# Patient Record
Sex: Female | Born: 1965
Health system: Southern US, Community
[De-identification: ages and names within clinical notes are randomized; demographics above are authoritative.]

## PROBLEM LIST (undated history)

## (undated) DIAGNOSIS — T4145XA Adverse effect of unspecified anesthetic, initial encounter: Secondary | ICD-10-CM

## (undated) DIAGNOSIS — N6019 Diffuse cystic mastopathy of unspecified breast: Secondary | ICD-10-CM

## (undated) DIAGNOSIS — T8859XA Other complications of anesthesia, initial encounter: Secondary | ICD-10-CM

## (undated) DIAGNOSIS — J45909 Unspecified asthma, uncomplicated: Secondary | ICD-10-CM

## (undated) DIAGNOSIS — T7840XA Allergy, unspecified, initial encounter: Secondary | ICD-10-CM

## (undated) DIAGNOSIS — R112 Nausea with vomiting, unspecified: Secondary | ICD-10-CM

## (undated) DIAGNOSIS — E039 Hypothyroidism, unspecified: Secondary | ICD-10-CM

## (undated) DIAGNOSIS — I10 Essential (primary) hypertension: Secondary | ICD-10-CM

## (undated) DIAGNOSIS — E079 Disorder of thyroid, unspecified: Secondary | ICD-10-CM

## (undated) DIAGNOSIS — Z9889 Other specified postprocedural states: Secondary | ICD-10-CM

## (undated) DIAGNOSIS — D649 Anemia, unspecified: Secondary | ICD-10-CM

## (undated) DIAGNOSIS — R2231 Localized swelling, mass and lump, right upper limb: Secondary | ICD-10-CM

## (undated) HISTORY — DX: Allergy, unspecified, initial encounter: T78.40XA

## (undated) HISTORY — DX: Unspecified asthma, uncomplicated: J45.909

## (undated) HISTORY — PX: TUBAL LIGATION: SHX77

## (undated) HISTORY — PX: BREAST CYST EXCISION: SHX579

## (undated) HISTORY — PX: BREAST SURGERY: SHX581

## (undated) HISTORY — PX: WISDOM TOOTH EXTRACTION: SHX21

## (undated) HISTORY — DX: Anemia, unspecified: D64.9

## (undated) HISTORY — DX: Diffuse cystic mastopathy of unspecified breast: N60.19

## (undated) HISTORY — PX: BREAST EXCISIONAL BIOPSY: SUR124

## (undated) HISTORY — DX: Disorder of thyroid, unspecified: E07.9

---
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
## 1997-07-18 ENCOUNTER — Other Ambulatory Visit: Admission: RE | Admit: 1997-07-18 | Discharge: 1997-07-18 | Payer: Self-pay | Admitting: Obstetrics and Gynecology

## 1998-07-24 ENCOUNTER — Other Ambulatory Visit: Admission: RE | Admit: 1998-07-24 | Discharge: 1998-07-24 | Payer: Self-pay | Admitting: *Deleted

## 1999-03-09 ENCOUNTER — Inpatient Hospital Stay (HOSPITAL_COMMUNITY): Admission: AD | Admit: 1999-03-09 | Discharge: 1999-03-09 | Payer: Self-pay | Admitting: Obstetrics and Gynecology

## 1999-05-03 ENCOUNTER — Inpatient Hospital Stay (HOSPITAL_COMMUNITY): Admission: AD | Admit: 1999-05-03 | Discharge: 1999-05-03 | Payer: Self-pay

## 1999-05-16 ENCOUNTER — Encounter (INDEPENDENT_AMBULATORY_CARE_PROVIDER_SITE_OTHER): Payer: Self-pay | Admitting: Specialist

## 1999-05-16 ENCOUNTER — Inpatient Hospital Stay (HOSPITAL_COMMUNITY): Admission: AD | Admit: 1999-05-16 | Discharge: 1999-05-18 | Payer: Self-pay | Admitting: Obstetrics and Gynecology

## 1999-05-19 ENCOUNTER — Encounter: Admission: RE | Admit: 1999-05-19 | Discharge: 1999-06-17 | Payer: Self-pay | Admitting: *Deleted

## 1999-07-01 ENCOUNTER — Other Ambulatory Visit: Admission: RE | Admit: 1999-07-01 | Discharge: 1999-07-01 | Payer: Self-pay | Admitting: *Deleted

## 1999-10-30 ENCOUNTER — Other Ambulatory Visit: Admission: RE | Admit: 1999-10-30 | Discharge: 1999-10-30 | Payer: Self-pay | Admitting: Obstetrics and Gynecology

## 2000-07-08 ENCOUNTER — Other Ambulatory Visit: Admission: RE | Admit: 2000-07-08 | Discharge: 2000-07-08 | Payer: Self-pay | Admitting: Obstetrics and Gynecology

## 2000-09-13 ENCOUNTER — Encounter: Payer: Self-pay | Admitting: Internal Medicine

## 2000-09-13 ENCOUNTER — Encounter: Admission: RE | Admit: 2000-09-13 | Discharge: 2000-09-13 | Payer: Self-pay | Admitting: Internal Medicine

## 2001-03-17 ENCOUNTER — Encounter: Admission: RE | Admit: 2001-03-17 | Discharge: 2001-03-17 | Payer: Self-pay | Admitting: Internal Medicine

## 2001-03-17 ENCOUNTER — Encounter: Payer: Self-pay | Admitting: Internal Medicine

## 2001-07-22 ENCOUNTER — Other Ambulatory Visit: Admission: RE | Admit: 2001-07-22 | Discharge: 2001-07-22 | Payer: Self-pay | Admitting: Obstetrics and Gynecology

## 2001-07-26 ENCOUNTER — Encounter: Admission: RE | Admit: 2001-07-26 | Discharge: 2001-07-26 | Payer: Self-pay | Admitting: Obstetrics and Gynecology

## 2001-07-26 ENCOUNTER — Encounter: Payer: Self-pay | Admitting: Obstetrics and Gynecology

## 2002-01-09 ENCOUNTER — Encounter: Admission: RE | Admit: 2002-01-09 | Discharge: 2002-01-09 | Payer: Self-pay | Admitting: Obstetrics and Gynecology

## 2002-01-09 ENCOUNTER — Encounter: Payer: Self-pay | Admitting: Obstetrics and Gynecology

## 2002-07-07 ENCOUNTER — Encounter: Payer: Self-pay | Admitting: Obstetrics and Gynecology

## 2002-07-07 ENCOUNTER — Encounter: Admission: RE | Admit: 2002-07-07 | Discharge: 2002-07-07 | Payer: Self-pay | Admitting: Obstetrics and Gynecology

## 2002-08-03 ENCOUNTER — Other Ambulatory Visit: Admission: RE | Admit: 2002-08-03 | Discharge: 2002-08-03 | Payer: Self-pay | Admitting: Obstetrics and Gynecology

## 2002-11-06 ENCOUNTER — Encounter: Admission: RE | Admit: 2002-11-06 | Discharge: 2002-11-06 | Payer: Self-pay | Admitting: Internal Medicine

## 2002-11-06 ENCOUNTER — Encounter: Payer: Self-pay | Admitting: Internal Medicine

## 2003-08-24 ENCOUNTER — Other Ambulatory Visit: Admission: RE | Admit: 2003-08-24 | Discharge: 2003-08-24 | Payer: Self-pay | Admitting: Obstetrics and Gynecology

## 2003-09-06 ENCOUNTER — Emergency Department (HOSPITAL_COMMUNITY): Admission: EM | Admit: 2003-09-06 | Discharge: 2003-09-06 | Payer: Self-pay | Admitting: Family Medicine

## 2003-09-11 ENCOUNTER — Encounter: Admission: RE | Admit: 2003-09-11 | Discharge: 2003-09-11 | Payer: Self-pay | Admitting: Obstetrics and Gynecology

## 2003-10-26 ENCOUNTER — Ambulatory Visit (HOSPITAL_COMMUNITY): Admission: RE | Admit: 2003-10-26 | Discharge: 2003-10-26 | Payer: Self-pay | Admitting: Internal Medicine

## 2004-08-25 ENCOUNTER — Other Ambulatory Visit: Admission: RE | Admit: 2004-08-25 | Discharge: 2004-08-25 | Payer: Self-pay | Admitting: Obstetrics and Gynecology

## 2004-09-01 ENCOUNTER — Ambulatory Visit (HOSPITAL_COMMUNITY): Admission: RE | Admit: 2004-09-01 | Discharge: 2004-09-01 | Payer: Self-pay | Admitting: Internal Medicine

## 2004-09-12 ENCOUNTER — Ambulatory Visit (HOSPITAL_COMMUNITY): Admission: RE | Admit: 2004-09-12 | Discharge: 2004-09-12 | Payer: Self-pay | Admitting: Obstetrics and Gynecology

## 2004-09-16 ENCOUNTER — Ambulatory Visit (HOSPITAL_COMMUNITY): Admission: RE | Admit: 2004-09-16 | Discharge: 2004-09-16 | Payer: Self-pay | Admitting: Internal Medicine

## 2005-09-02 ENCOUNTER — Other Ambulatory Visit: Admission: RE | Admit: 2005-09-02 | Discharge: 2005-09-02 | Payer: Self-pay | Admitting: Obstetrics and Gynecology

## 2005-09-16 ENCOUNTER — Ambulatory Visit (HOSPITAL_COMMUNITY): Admission: RE | Admit: 2005-09-16 | Discharge: 2005-09-16 | Payer: Self-pay | Admitting: Obstetrics and Gynecology

## 2006-10-08 ENCOUNTER — Ambulatory Visit (HOSPITAL_COMMUNITY): Admission: RE | Admit: 2006-10-08 | Discharge: 2006-10-08 | Payer: Self-pay | Admitting: Obstetrics and Gynecology

## 2006-10-19 ENCOUNTER — Encounter: Admission: RE | Admit: 2006-10-19 | Discharge: 2006-10-19 | Payer: Self-pay | Admitting: Obstetrics and Gynecology

## 2006-11-01 ENCOUNTER — Encounter: Admission: RE | Admit: 2006-11-01 | Discharge: 2006-11-01 | Payer: Self-pay | Admitting: Obstetrics and Gynecology

## 2006-12-31 ENCOUNTER — Encounter (INDEPENDENT_AMBULATORY_CARE_PROVIDER_SITE_OTHER): Payer: Self-pay | Admitting: Surgery

## 2006-12-31 ENCOUNTER — Ambulatory Visit (HOSPITAL_BASED_OUTPATIENT_CLINIC_OR_DEPARTMENT_OTHER): Admission: RE | Admit: 2006-12-31 | Discharge: 2006-12-31 | Payer: Self-pay | Admitting: Surgery

## 2006-12-31 ENCOUNTER — Encounter: Admission: RE | Admit: 2006-12-31 | Discharge: 2006-12-31 | Payer: Self-pay | Admitting: Surgery

## 2007-08-23 ENCOUNTER — Emergency Department (HOSPITAL_COMMUNITY): Admission: EM | Admit: 2007-08-23 | Discharge: 2007-08-23 | Payer: Self-pay | Admitting: Emergency Medicine

## 2007-11-03 ENCOUNTER — Ambulatory Visit (HOSPITAL_BASED_OUTPATIENT_CLINIC_OR_DEPARTMENT_OTHER): Admission: RE | Admit: 2007-11-03 | Discharge: 2007-11-03 | Payer: Self-pay | Admitting: Internal Medicine

## 2007-12-28 ENCOUNTER — Ambulatory Visit (HOSPITAL_BASED_OUTPATIENT_CLINIC_OR_DEPARTMENT_OTHER): Admission: RE | Admit: 2007-12-28 | Discharge: 2007-12-28 | Payer: Self-pay | Admitting: Internal Medicine

## 2008-07-19 ENCOUNTER — Ambulatory Visit: Payer: Self-pay | Admitting: Diagnostic Radiology

## 2008-07-19 ENCOUNTER — Ambulatory Visit (HOSPITAL_BASED_OUTPATIENT_CLINIC_OR_DEPARTMENT_OTHER): Admission: RE | Admit: 2008-07-19 | Discharge: 2008-07-19 | Payer: Self-pay | Admitting: Endocrinology

## 2008-10-09 ENCOUNTER — Ambulatory Visit: Payer: Self-pay | Admitting: Internal Medicine

## 2008-11-06 ENCOUNTER — Ambulatory Visit (HOSPITAL_BASED_OUTPATIENT_CLINIC_OR_DEPARTMENT_OTHER): Admission: RE | Admit: 2008-11-06 | Discharge: 2008-11-06 | Payer: Self-pay | Admitting: Internal Medicine

## 2008-12-04 ENCOUNTER — Ambulatory Visit: Payer: Self-pay | Admitting: Internal Medicine

## 2009-02-17 ENCOUNTER — Emergency Department (HOSPITAL_COMMUNITY): Admission: EM | Admit: 2009-02-17 | Discharge: 2009-02-17 | Payer: Self-pay | Admitting: Family Medicine

## 2009-04-20 ENCOUNTER — Ambulatory Visit: Payer: Self-pay | Admitting: Family Medicine

## 2009-04-20 DIAGNOSIS — R51 Headache: Secondary | ICD-10-CM | POA: Insufficient documentation

## 2009-04-20 DIAGNOSIS — R519 Headache, unspecified: Secondary | ICD-10-CM | POA: Insufficient documentation

## 2009-04-20 DIAGNOSIS — S338XXA Sprain of other parts of lumbar spine and pelvis, initial encounter: Secondary | ICD-10-CM | POA: Insufficient documentation

## 2009-04-20 DIAGNOSIS — J309 Allergic rhinitis, unspecified: Secondary | ICD-10-CM | POA: Insufficient documentation

## 2009-08-23 ENCOUNTER — Ambulatory Visit: Payer: Self-pay | Admitting: Internal Medicine

## 2009-11-12 ENCOUNTER — Ambulatory Visit (HOSPITAL_BASED_OUTPATIENT_CLINIC_OR_DEPARTMENT_OTHER): Admission: RE | Admit: 2009-11-12 | Discharge: 2009-11-12 | Payer: Self-pay | Admitting: Obstetrics and Gynecology

## 2009-11-12 ENCOUNTER — Ambulatory Visit: Payer: Self-pay | Admitting: Diagnostic Radiology

## 2009-12-10 ENCOUNTER — Ambulatory Visit: Payer: Self-pay | Admitting: Internal Medicine

## 2009-12-19 ENCOUNTER — Ambulatory Visit: Payer: Self-pay | Admitting: Internal Medicine

## 2010-03-30 ENCOUNTER — Encounter: Payer: Self-pay | Admitting: Endocrinology

## 2010-03-31 ENCOUNTER — Encounter: Payer: Self-pay | Admitting: Endocrinology

## 2010-04-10 NOTE — Assessment & Plan Note (Signed)
Summary: L Hip sore - x 1 week rm 3   Vital Signs:  Patient Profile:   45 Years Old Female CC:      L Hip - sore x 1 week Height:     69 inches Weight:      186 pounds O2 Sat:      100 % O2 treatment:    Room Air Temp:     98.9 degrees F oral Pulse rate:   75 / minute Pulse rhythm:   regular Resp:     16 per minute BP sitting:   120 / 83  (right arm) Cuff size:   regular  Vitals Entered By: Areta Haber CMA (April 20, 2009 12:21 PM)                  Prior Medication List:  No prior medications documented  Current Allergies (reviewed today): ! ASA ! NSAIDSHistory of Present Illness Chief Complaint: L Hip - sore x 1 week History of Present Illness: Paintent was in an akward positon whe she flexed her L hip. Her R foot hit the floor . She felt as if her lower pelvis was stretched. She has had the pain for about a week now and was concerned.   Current Problems: PELVIC SPRAIN AND STRAINS (ICD-848.5) FAMILY HISTORY DIABETES 1ST DEGREE RELATIVE (ICD-V18.0) ALLERGIC RHINITIS (ICD-477.9) HEADACHE (ICD-784.0)   Current Meds VITAMIN D3 1000 UNIT TABS (CHOLECALCIFEROL) 1 tab by mouth once daily COMPLETE ALLERGY 25 MG CAPS (DIPHENHYDRAMINE HCL) 1 tab by mouth once daily FIORICET 50-325-40 MG TABS (BUTALBITAL-APAP-CAFFEINE) 1-2  tabs as needed for headaches IMITREX 100 MG TABS (SUMATRIPTAN SUCCINATE) 1-2 tabs as needed * BIOFREEZE sig apply 2-3x aday  REVIEW OF SYSTEMS Constitutional Symptoms      Denies fever, chills, night sweats, weight loss, weight gain, and fatigue.  Eyes       Denies change in vision, eye pain, eye discharge, glasses, contact lenses, and eye surgery. Ear/Nose/Throat/Mouth       Denies hearing loss/aids, change in hearing, ear pain, ear discharge, dizziness, frequent runny nose, frequent nose bleeds, sinus problems, sore throat, hoarseness, and tooth pain or bleeding.  Respiratory       Denies dry cough, productive cough, wheezing, shortness of  breath, asthma, bronchitis, and emphysema/COPD.  Cardiovascular       Denies murmurs, chest pain, and tires easily with exhertion.    Gastrointestinal       Denies stomach pain, nausea/vomiting, diarrhea, constipation, blood in bowel movements, and indigestion. Genitourniary       Denies painful urination, kidney stones, and loss of urinary control. Neurological       Denies paralysis, seizures, and fainting/blackouts. Musculoskeletal       Complains of joint pain and decreased range of motion.      Denies muscle pain, joint stiffness, redness, swelling, muscle weakness, and gout.      Comments: L Hip sore x 1 week Skin       Denies bruising, unusual mles/lumps or sores, and hair/skin or nail changes.  Psych       Denies mood changes, temper/anger issues, anxiety/stress, speech problems, depression, and sleep problems. Other Comments: Pt states that she flexed hip last Saturday and has been hurting/sore on and off. pt has not been seen by PCP for this.   Past History:  Family History: Last updated: 04/20/2009 Family History Diabetes 1st degree relative Family History Hypertension  Social History: Last updated: 04/20/2009 Married Never Smoked Alcohol use-yes, social Drug  use-no Regular exercise-yes  Risk Factors: Exercise: yes (04/20/2009)  Risk Factors: Smoking Status: never (04/20/2009)  Past Medical History: Headache Allergic rhinitis  Past Surgical History: benign calcification  Family History: Reviewed history and no changes required. Family History Diabetes 1st degree relative Family History Hypertension  Social History: Reviewed history and no changes required. Married Never Smoked Alcohol use-yes, social Drug use-no Regular exercise-yes Smoking Status:  never Drug Use:  no Does Patient Exercise:  yes Physical Exam General appearance: well developed, well nourished,mild distress Head: normocephalic, atraumatic Extremities: normal extremities good  range  no tenderness in groin  some mild tenderness in the hip jointof motion w hiop  Neurological: grossly intact and non-focal Back: no tenderness over musculature, straight leg raises negative bilaterally, deep tendon reflexes 2+ at achilles and patella Skin: no obvious rashes or lesions MSE: oriented to time, place, and person Assessment New Problems: PELVIC SPRAIN AND STRAINS (ICD-848.5) FAMILY HISTORY DIABETES 1ST DEGREE RELATIVE (ICD-V18.0) ALLERGIC RHINITIS (ICD-477.9) HEADACHE (ICD-784.0)  L hip pain  pelvic stain  Patient Education: Patient and/or caregiver instructed in the following: rest fluids and Tylenol.  Plan New Medications/Changes: BIOFREEZE sig apply 2-3x aday  #one tube x 1, 04/20/2009, Hassan Rowan MD  New Orders: New Patient Level III [phone] T-Hip Comp Left Min 2-views [73510TC] T-Pelvis 1or 2 views [72170TC] Planning Comments:   patient declines narcotic  Follow Up: Follow up on an as needed basis, Follow up with Primary Physician Work/School Excuse: Return to work/school tomorrow  The patient and/or caregiver has been counseled thoroughly with regard to medications prescribed including dosage, schedule, interactions, rationale for use, and possible side effects and they verbalize understanding.  Diagnoses and expected course of recovery discussed and will return if not improved as expected or if the condition worsens. Patient and/or caregiver verbalized understanding.  Prescriptions: BIOFREEZE sig apply 2-3x aday  #one tube x 1   Entered and Authorized by:   Hassan Rowan MD   Signed by:   Hassan Rowan MD on 04/20/2009   Method used:   Print then Give to Patient   RxID:   [id number]   Patient Instructions: 1)  Please schedule a follow-up appointment as needed. 2)  Please schedule an appointment with your primary doctor in :7-14 days if not getting better. May use ice -hot and biofreeze for discomfort.

## 2010-04-24 ENCOUNTER — Ambulatory Visit (INDEPENDENT_AMBULATORY_CARE_PROVIDER_SITE_OTHER): Payer: Self-pay | Admitting: Internal Medicine

## 2010-04-24 DIAGNOSIS — G43909 Migraine, unspecified, not intractable, without status migrainosus: Secondary | ICD-10-CM

## 2010-04-24 DIAGNOSIS — H6120 Impacted cerumen, unspecified ear: Secondary | ICD-10-CM

## 2010-07-17 ENCOUNTER — Telehealth: Payer: Self-pay | Admitting: Internal Medicine

## 2010-07-17 NOTE — Telephone Encounter (Signed)
Rx: Fioricet (#60) 1 to 2 tabs po Q 6 hours prn HA- Refill x 6 months called in to [facility] at [phone] per MD order.

## 2010-07-17 NOTE — Telephone Encounter (Signed)
It is OK to refill for 6 months- refer to [facility]

## 2010-07-22 ENCOUNTER — Other Ambulatory Visit (HOSPITAL_BASED_OUTPATIENT_CLINIC_OR_DEPARTMENT_OTHER): Payer: Self-pay | Admitting: Endocrinology

## 2010-07-22 DIAGNOSIS — E049 Nontoxic goiter, unspecified: Secondary | ICD-10-CM

## 2010-07-22 NOTE — Op Note (Signed)
[name], Erin [name]              ACCOUNT NO.:  [id number]   MEDICAL RECORD NO.:  [id number]          PATIENT TYPE:  AMB   LOCATION:  [facility]SC                          FACILITY:  [facility]   PHYSICIAN:  Currie Paris, M.D.DATE OF BIRTH:  March 05, 1966   DATE OF PROCEDURE:  12/31/2006  DATE OF DISCHARGE:  12/31/2006                               OPERATIVE REPORT   PREOPERATIVE DIAGNOSIS:  Left breast calcifications.   POSTOPERATIVE DIAGNOSIS:  Left breast calcifications.   OPERATION:  Needle-guided excisional biopsy of left breast  calcifications.   SURGEON:  Currie Paris, M.D.   ANESTHESIA:  MAC.   CLINICAL HISTORY:  This is a 45 year old lady who had a recent mammogram  showing some calcifications in the near-retroareolar area that were not  amenable to a core biopsy.  We elected, therefore, to do a needle-guided  excisional biopsy as the calcifications appeared indeterminate on  mammography.   DESCRIPTION OF PROCEDURE:  The patient was seen in the holding area and  we confirmed that the left side is the operative side, and I initialed  that side.  I reviewed the mammogram films and the guidewire tip  appeared to be near the calcifications and at a depth of about 3.6 cm  from the skin.   The patient was taken to the operating room and after satisfactory IV  sedation, the left breast was prepped and draped as a sterile field.  The time-out was performed.   A combination of 1% Xylocaine plain and 0.5% Marcaine with epinephrine  was mixed equally and used for local.  I infiltrated along the incision  line and into the breast tissue, which was noted to be quite dense.   I made a curvilinear incision at the areolar margin.  The guidewire  entered medially and tracked laterally and appeared to be right about  the top of the areolar margin.  I raised a little bit of a skin flap and  manipulated the guidewire into the wound.  I freed up a little bit of  the breast tissue  medial to the guidewire entry site so I could have a  little bit of freedom of mobility around the guidewire and then grasped  the guidewire tract with an Allis clamp.  With some traction on that I  took a cylinder-shaped piece of tissue around the guidewire, being sure  I got superficial to it since I wanted to make sure we had the area of  calcification included.  I went to beyond the tip of the guidewire.  This was then sent for specimen mammography.  This subsequently returned  that the calcifications were contained in the specimen.   While waiting for the report from radiology, I made sure everything was  dry using cautery.  I then closed the very dense breast tissue with some  3-0 Vicryl, followed by 4-0 Monocryl subcuticular and, finally,  Dermabond.   The patient tolerated the procedure well and there no operative  complications.  All counts were correct.      Currie Paris, M.D.  Electronically Signed     CJS/MEDQ  D:  12/31/2006  T:  01/01/2007  Job:  [id number]   cc:   Luanna Cole. Lenord Fellers, M.D.

## 2010-07-24 ENCOUNTER — Other Ambulatory Visit (HOSPITAL_BASED_OUTPATIENT_CLINIC_OR_DEPARTMENT_OTHER): Payer: Commercial Managed Care - PPO

## 2010-07-25 NOTE — Op Note (Signed)
[facility]  Patient:    Erin [name], Erin [name]                     MRN: [id number] Proc. Date: 05/17/99 Adm. Date:  [id number] Disc. Date: [id number] Attending:  Dierdre Forth Pearline                           Operative Report  PREOPERATIVE DIAGNOSIS:       Post partum elective sterilization.  POSTOPERATIVE DIAGNOSIS:      Post partum elective sterilization.  OPERATION:                    Post partum tubal ligation by Pomeroy method.  SURGEON:                      Georgina Peer, M.D.  ANESTHESIA:                   Epidural.  ESTIMATED BLOOD LOSS:         Less than 25 cc.  COMPLICATIONS:                None.  FINDINGS:                     Normal tubes.  INDICATIONS:                  A 45 year old African-American female who delivered on May 16, 1999.  She was a gravida 3, para 2.  She desires permanent sterilization.  She is aware of risks and complications of post partum tubal including bleeding, infection and the tubal sterilization failure rate of 1 to % lifetime risks.  She accepts these and is willing to proceed.  DESCRIPTION OF PROCEDURE:     The patient was taken to the operating room.  She was redosed with her epidural and given a adequate epidural.  The patient then had er abdomen and periumbilical area prepped and draped sterilely.  A transverse subumbilical incision was made approximately 6 cm in diameter. The fascia was divided, the peritoneum was isolated and divided.  Retractors were placed.  The  right tube was identified.  It was brought up through the incision.  A knuckle f tube was tied with two plain ties, the mid portion of the knuckle excised.  The  bleeders were cauterized.  The left tube was isolated, brought up through the incision and a knuckle of tube was tied doubly with plain ties and the mid portion excised.  Bleeders were cauterized.  Fascia was closed with 0 Vicryl, 10 cc of 0.25% Marcaine was injected  to the fascia and subcutaneous areas.  The  skin was closed with subcuticular Dexon.  Bleeders were cauterized.  Sponge, needle and instrument counts were correct. The patient tolerated the procedure well and was transferred to recovery area in stable condition. DD:  05/17/99 TD:  05/18/99 Job: [id number]

## 2010-07-25 NOTE — Discharge Summary (Signed)
[facility]  Patient:    Erin [name], Erin [name]                     MRN: [id number] Adm. Date:  [id number] Disc. Date: [id number] Attending:  Shaune Spittle Dictator:   Erin Sons, C.N.M.                           Discharge Summary  ADMISSION DIAGNOSES:          1. Intrauterine pregnancy at term.                               2. History of macrosomia.                               3. History of herpes simplex virus II, no lesions.                               4. Desires tubal ligation.                               5. History of asthma.  DISCHARGE DIAGNOSES:          1. Intrauterine pregnancy at term.                               2. Elective sterilization.                               3. Infant with fractured clavicle.  PROCEDURE:                    1) Epidural anesthesia.  2) Pitocin augmentation.  3) Intrauterine pressure catheter.  4) Normal spontaneous vaginal delivery over  intact perineum.  HOSPITAL COURSE:              Erin [name] was a 45 year old, gravida 3, para 1-0-1-1, at 41-1/7 weeks who presented on May 16, 1999, with uterine contractions every two to three minutes.  Pregnancy had been remarkable for:  1) History of cryosurgery.  2) History of preterm labor with term delivery.  3) History of macrosomia.  4) History of HSV II.  5) Desires bilateral tubal ligation. 6) History of asthma.  On admission, cervix was initially 2 cm and then changed to 3 to 4 cm after a period of observation.  There were no HSV lesions noted.  The patient was admitted to the birthing suite for continued labor care.  Epidural was placed per patients request, although, minimal pain relief was obtained initially.  The patient did  have one episode of bradycardia with spontaneous hyperstimulation.  Scalp lead as applied.  O2 was given and position change.  The baby had positive response to fetal scalp stimulation.  Terbutaline 0.25 mg was  given and fetal heart rate recovered to a stable baseline with reactivity.  At that time cervix was 7 to 8 cm, that was at approximately 11 a.m.  Pitocin augmentation was placed subsequently at approximately 1:15 secondary to the cervix being 8 cm.  An IUPC was placed and Pitocin was begun since [facility]  units were inadequate.  The patient then progressed to completely dilated at 3 p.m. and delivered at 3:44 a viable female infant by the name of Maya, weight 9 pounds 4 ounces, Apgars 8 and 9.  The infant was DeLeed on the perineum secondary to previously noted light meconium stained  fluid.  There was no shoulder dystocia noted.  The infant was handed off to the  awaiting NICU team and the baby was determined to be stable.  The placenta was spontaneous and intact with a three vessel cord and no lacerations were noted. The infant was taken to the fullterm nursery.  The mother was taken to the recovery  room in good condition.  The patient continued to desire tubal ligation.  This as scheduled for May 17, 1999.  By postpartum day #1, the patient was doing well. Breastfeeding was going slowly secondary to flat nipples.  The infant had been noted to have a fractured clavicle, but was doing well.  The tubal was done by Georgina Peer, M.D. on May 17, 1999, and the patient tolerated the procedure well.  It was done under the existing epidural anesthesia.  Normal tubes and ovaries were noted.  By postpartum and postoperative day #2 the patient was up d lib.  Breastfeeding was continuing to be somewhat slow.  The baby was being supplemented.  The infant was doing well in light of the fractured clavicle and was having no difficulty.  The patient was deemed to have received the full benefit of the hospital stay and was discharged home.  DISCHARGE INSTRUCTIONS:       Per [facility] and Gynecology handout.  DISCHARGE MEDICATIONS:        1. Tylox one to two p.o.  q.3-4h. p.r.n. pain.                               2. Since the patient is allergic to ibuprofen, she                                  will use Tylenol one to two p.o. q.3-4h. p.r.n.                                  pain as needed.  FOLLOW-UP:                    Discharge follow-up will occur in six weeks at [facility] and Gynecology. DD:  05/18/99 TD:  05/18/99 Job: [id number]

## 2010-07-25 NOTE — H&P (Signed)
[facility]  Patient:    Erin [name], Erin [name]                     MRN: [id number] Adm. Date:  [id number] Attending:  Shaune Spittle Dictator:   Nigel Bridgeman, C.N.M.                         History and Physical  HISTORY:  The patient is a 45 year old gravida 3, para 1-0-1-1 at 41-1/7 weeks ho presents with uterine contractions every two to three minutes since 4:30 a.m. he denies any leaking or bleeding and reports positive fetal movement.  The patient is presenting with very frequent uterine contractions and the urge to push. Pregnancy has been remarkable for (1) history of cryosurgery, (2) history of preterm labor with term deliver, (3) History of macrosomia, (4) history of HSV2 with no outbreaks during pregnancy, (5) desires bilateral tubal ligation, and (6) history of asthma.  PRENATAL LABS:  Blood type is O+, Rh antibody negative.  VDRL nonreactive, rubella titer positive.  Hepatitis B surface antigen negative, HIV negative, sickle cell test negative.  Pap was normal in May of 2000.  Glucose challenge was normal at 16 weeks and also normal at 28 weeks.  Group B strep culture was negative at 36 weeks. Estimated date of confinement of May 08, 1999 was established by last menstrual period and was in agreement with ultrasound at approximately 16 weeks. Hemoglobin upon entry into practice was 12.2.  It was 11.5 at 27 weeks.  HISTORY OF PRESENT PREGNANCY:  Patient entered care at approximately nine weeks  gestation.  She had a 16 week one-hour glucola secondary to history of macrosomia. She did desire tubal.  She met with Dr. Stefano Gaul at 20 weeks to discuss.  She had an episodes of premature contractions in December which was treated with terbutaline and fluids.  She had an HSV lesion at approximately 32 weeks but no  recurrence.  She had a nonstress test on May 15, 1999 which was reactive.  The  rest of her pregnancy has been  essentially uncomplicated.  OBSTETRIC HISTORY:  In 1991 she had a therapeutic termination of pregnancy at nine to [redacted] week gestation without complications.  In 1996 she had a vaginal birth of a female infant, weight 9 pounds and 5 ounces at [redacted] weeks gestation.  She was in labor approximately 16 hours.  She had epidural anesthesia.  During that pregnancy she was on iron supplements.  She had an episode of preterm labor at 32 weeks which  require magnesium sulfate therapy and then was on terbutaline at home.  She was  diagnosed HSV during her previous pregnancy and has had very seldom outbreaks.  PAST MEDICAL HISTORY:  She was on birth control pills from 1986 and then stopped and she began using them again approximately two to three years ago.  In 1986 or 1987 she had an abnormal Pap smear which required cryosurgery.  All her Paps have been normal since.  She has superficial varicosities.  She has a history of asthma but has not had any recent attacks.  She had a benign cyst and tumor removed from her breast.  She had nausea and vomiting after that surgery.  ALLERGIES:  ASPIRIN, ALL NONSTEROIDAL ANTI-INFLAMMATORY AGENTS.  FAMILY HISTORY:  Patients father is hypertensive and is on medication.  Her father also has type 2 diabetes, oral control.  Her maternal grandfather had prostate cancer.  Her father was a smoker.  Her maternal grandmother is also a smoker.  GENETIC HISTORY:  Remarkable for maternal aunt having Down syndrome.  Father of the babys brother has sickle cell trait.  SOCIAL HISTORY:  The patient is married to the father of the baby.  He is involved and supportive.  His name is Oyuki Hogan.  The patient African-American of the [facility] faith.  She is employed as a Designer, jewellery at [employer]. [employer].  She is college educated.  Her husband also has a college degree and s employed as a Production designer, theatre/television/film.  She has been followed by the certified nurse  midwife service at [facility].  She denies any alcohol, drug or tobacco use during this pregnancy.  PHYSICAL EXAMINATION:  VITAL SIGNS:  Stable.  Patient is afebrile.  HEENT:  Within normal limits.  LUNGS:  Bilateral breath sound clear.  HEART:  Regular rate and rhythm without murmur.  BREASTS:  Soft and nontender.  ABDOMEN:  Fundal height is approximately 39 cm.  Estimated fetal weight is 9 to  9-1/2 pounds.  Uterine contractions every 2 to 2-1/2 minutes, strong quality.  CERVICAL EXAM:  Initially by registered nurse 2 cm, 80% vertex and a -1 station  with intact bag of water.  Fetal heart rate initially demonstrated a run of three late decelerations with frequent contractions.  These did resolve with IV fluids, oxygen and position change.  Fetal heart rate is now reactive with no decelerations.  EXTREMITIES:  Deep tendon reflexes are 2 to 3+.  There is questionably one beat of clonus on either side.  This is most likely due to agitation.  No HSV lesions are noted and no prodome is described.  IMPRESSION: 1. Intrauterine pregnancy at 41-1/7 weeks. 2. Early active labor. 3. Negative group B strep. 4. History of macrosomia. 5. History of HSV2 with no current lesions. 6. Desires bilateral tubal ligation.  PLAN: 1. Admit to birthing suite for consult with Dr. Trudie Reed as attending    physician. 2. Routine certified nurse midwife orders. 3. Patient considering epidural but at this point prefers IV sedation. 4. Anticipate normal spontaneous vaginal birth. DD:  05/16/99 TD:  05/16/99 Job: [id number]

## 2010-07-29 ENCOUNTER — Ambulatory Visit (HOSPITAL_BASED_OUTPATIENT_CLINIC_OR_DEPARTMENT_OTHER)
Admission: RE | Admit: 2010-07-29 | Discharge: 2010-07-29 | Disposition: A | Discharge status: Home / Self Care | Payer: 59 | Source: Ambulatory Visit | Attending: Endocrinology | Admitting: Endocrinology

## 2010-07-29 DIAGNOSIS — E049 Nontoxic goiter, unspecified: Secondary | ICD-10-CM | POA: Insufficient documentation

## 2010-07-29 DIAGNOSIS — Z09 Encounter for follow-up examination after completed treatment for conditions other than malignant neoplasm: Secondary | ICD-10-CM | POA: Insufficient documentation

## 2010-09-11 ENCOUNTER — Other Ambulatory Visit: Payer: Self-pay | Admitting: Internal Medicine

## 2010-09-11 DIAGNOSIS — Z1231 Encounter for screening mammogram for malignant neoplasm of breast: Secondary | ICD-10-CM

## 2010-09-29 ENCOUNTER — Telehealth: Payer: Self-pay | Admitting: Internal Medicine

## 2010-09-29 DIAGNOSIS — Z0000 Encounter for general adult medical examination without abnormal findings: Secondary | ICD-10-CM

## 2010-09-29 MED ORDER — HYDROCORTISONE 2.5 % RE CREA: TOPICAL_CREAM | Freq: Four times a day (QID) | RECTAL | Status: AC

## 2010-09-29 NOTE — Telephone Encounter (Signed)
Call in Proctocream [facility] to use in rectal area 4 times a day to [facility] [phone]

## 2010-11-21 ENCOUNTER — Ambulatory Visit (HOSPITAL_BASED_OUTPATIENT_CLINIC_OR_DEPARTMENT_OTHER): Payer: Commercial Managed Care - PPO

## 2010-11-24 ENCOUNTER — Ambulatory Visit (HOSPITAL_BASED_OUTPATIENT_CLINIC_OR_DEPARTMENT_OTHER)
Admission: RE | Admit: 2010-11-24 | Discharge: 2010-11-24 | Disposition: A | Discharge status: Home / Self Care | Payer: 59 | Source: Ambulatory Visit | Attending: Internal Medicine | Admitting: Internal Medicine

## 2010-11-24 DIAGNOSIS — Z1231 Encounter for screening mammogram for malignant neoplasm of breast: Secondary | ICD-10-CM

## 2010-12-02 ENCOUNTER — Other Ambulatory Visit: Payer: Self-pay | Admitting: Internal Medicine

## 2010-12-02 DIAGNOSIS — R928 Other abnormal and inconclusive findings on diagnostic imaging of breast: Secondary | ICD-10-CM

## 2010-12-09 ENCOUNTER — Encounter: Payer: Self-pay | Admitting: Internal Medicine

## 2010-12-11 ENCOUNTER — Other Ambulatory Visit: Payer: 59 | Admitting: Internal Medicine

## 2010-12-11 ENCOUNTER — Other Ambulatory Visit: Payer: Self-pay | Admitting: Internal Medicine

## 2010-12-11 DIAGNOSIS — Z0000 Encounter for general adult medical examination without abnormal findings: Secondary | ICD-10-CM

## 2010-12-11 LAB — LIPID PANEL
Cholesterol: 178 mg/dL (ref 0–200)
Total CHOL/HDL Ratio: 3.6 Ratio
Triglycerides: 81 mg/dL (ref <150)
VLDL: 16 mg/dL (ref 0–40)

## 2010-12-11 LAB — COMPREHENSIVE METABOLIC PANEL
BUN: 11 mg/dL (ref 6–23)
CO2: 26 mEq/L (ref 19–32)
Creat: 0.69 mg/dL (ref 0.50–1.10)
Glucose, Bld: 84 mg/dL (ref 70–99)
Total Bilirubin: 0.5 mg/dL (ref 0.3–1.2)

## 2010-12-11 LAB — CBC WITH DIFFERENTIAL/PLATELET
Eosinophils Absolute: 0.1 10*3/uL (ref 0.0–0.7)
Eosinophils Relative: 2 % (ref 0–5)
Lymphs Abs: 1.6 10*3/uL (ref 0.7–4.0)
MCH: 28.5 pg (ref 26.0–34.0)
MCV: 88.6 fL (ref 78.0–100.0)
Monocytes Relative: 12 % (ref 3–12)
Platelets: 188 10*3/uL (ref 150–400)
RBC: 4.14 MIL/uL (ref 3.87–5.11)

## 2010-12-16 ENCOUNTER — Ambulatory Visit (INDEPENDENT_AMBULATORY_CARE_PROVIDER_SITE_OTHER): Payer: 59 | Admitting: Internal Medicine

## 2010-12-16 ENCOUNTER — Ambulatory Visit
Admission: RE | Admit: 2010-12-16 | Discharge: 2010-12-16 | Disposition: A | Discharge status: Home / Self Care | Payer: 59 | Source: Ambulatory Visit | Attending: Internal Medicine | Admitting: Internal Medicine

## 2010-12-16 ENCOUNTER — Encounter: Payer: Self-pay | Admitting: Internal Medicine

## 2010-12-16 VITALS — BP 116/82 | HR 78 | Temp 97.6°F | Ht 68.5 in | Wt 192.0 lb

## 2010-12-16 DIAGNOSIS — R928 Other abnormal and inconclusive findings on diagnostic imaging of breast: Secondary | ICD-10-CM

## 2010-12-16 DIAGNOSIS — E049 Nontoxic goiter, unspecified: Secondary | ICD-10-CM

## 2010-12-16 DIAGNOSIS — N6019 Diffuse cystic mastopathy of unspecified breast: Secondary | ICD-10-CM

## 2010-12-16 DIAGNOSIS — Z0000 Encounter for general adult medical examination without abnormal findings: Secondary | ICD-10-CM

## 2010-12-16 DIAGNOSIS — G43909 Migraine, unspecified, not intractable, without status migrainosus: Secondary | ICD-10-CM

## 2010-12-16 LAB — POCT URINALYSIS DIPSTICK
Ketones, UA: NEGATIVE
Leukocytes, UA: NEGATIVE
Protein, UA: NEGATIVE
pH, UA: 7

## 2010-12-16 LAB — IRON AND TIBC: %SAT: 32 % (ref 20–55)

## 2010-12-18 ENCOUNTER — Encounter: Payer: Self-pay | Admitting: Internal Medicine

## 2011-01-09 ENCOUNTER — Telehealth: Payer: Self-pay | Admitting: Internal Medicine

## 2011-01-09 MED ORDER — MUPIROCIN 2 % EX OINT: TOPICAL_OINTMENT | CUTANEOUS | Status: DC

## 2011-01-09 NOTE — Telephone Encounter (Signed)
rx  E scribed as requested

## 2011-01-09 NOTE — Telephone Encounter (Signed)
Call in Bactroban ointment 1 tube with one refill to apply to laceration bid

## 2011-01-11 DIAGNOSIS — K649 Unspecified hemorrhoids: Secondary | ICD-10-CM | POA: Insufficient documentation

## 2011-01-11 DIAGNOSIS — G43909 Migraine, unspecified, not intractable, without status migrainosus: Secondary | ICD-10-CM | POA: Insufficient documentation

## 2011-01-11 DIAGNOSIS — E049 Nontoxic goiter, unspecified: Secondary | ICD-10-CM | POA: Insufficient documentation

## 2011-01-11 DIAGNOSIS — N6019 Diffuse cystic mastopathy of unspecified breast: Secondary | ICD-10-CM | POA: Insufficient documentation

## 2011-01-11 NOTE — Progress Notes (Signed)
  Subjective:    Patient ID: Erin [name], female    DOB: 02-Jan-1966, 45 y.o.   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 GYN is Dr. Stefano Gaul. Had sigmoidoscopy 2003 for rectal bleeding.  Social history: patient is married& has 2 children; works in cardiovascular rehabilitation  Family history: mother in good health; father with diabetes and hypertension; one brother and one sister in good health.    Review of Systems noncontributory except history of present illness     Objective:   Physical Exam  Vitals reviewed. Constitutional: She is oriented to person, place, and time. She appears well-developed and well-nourished.  HENT:  Head: Atraumatic.  Right Ear: External ear normal.  Left Ear: External ear normal.  Mouth/Throat: Oropharynx is clear and moist.  Eyes: Conjunctivae and EOM are normal. Pupils are equal, round, and reactive to light.  Neck: Neck supple. No JVD present. Thyromegaly present.  Cardiovascular: Normal rate, regular rhythm, normal heart sounds and intact distal pulses.   No murmur heard. Pulmonary/Chest: Effort normal and breath sounds normal. She has no wheezes. She has no rales.       Breast: fibrocystic changes  Abdominal: Soft. Bowel sounds are normal. She exhibits no mass. There is no tenderness. There is no guarding.  Genitourinary:       Deferred to GYN  Musculoskeletal: She exhibits no edema.  Lymphadenopathy:    She has no cervical adenopathy.  Neurological: She is alert and oriented to person, place, and time. She has normal reflexes. No cranial nerve deficit. Coordination normal.  Skin: Skin is dry.  Psychiatric: She has a normal mood and affect. Her behavior is normal.  Judgment and thought content normal.          Assessment & Plan:  Migraine headaches  Fibrocystic breast disease  Allergic rhinitis  Goiter  Hemorrhoids  Plan: Okay to refill migraine medications as needed for when necessary one year. Return in one year

## 2011-01-11 NOTE — Patient Instructions (Signed)
Continue same medications. Return one year or as needed

## 2011-03-17 ENCOUNTER — Ambulatory Visit (INDEPENDENT_AMBULATORY_CARE_PROVIDER_SITE_OTHER): Payer: 59 | Admitting: Internal Medicine

## 2011-03-17 ENCOUNTER — Encounter: Payer: Self-pay | Admitting: Internal Medicine

## 2011-03-17 VITALS — BP 136/88 | HR 84 | Temp 98.5°F | Wt 193.0 lb

## 2011-03-17 DIAGNOSIS — J040 Acute laryngitis: Secondary | ICD-10-CM

## 2011-03-17 DIAGNOSIS — J45909 Unspecified asthma, uncomplicated: Secondary | ICD-10-CM

## 2011-03-17 DIAGNOSIS — J029 Acute pharyngitis, unspecified: Secondary | ICD-10-CM

## 2011-03-18 ENCOUNTER — Encounter: Payer: Self-pay | Admitting: Internal Medicine

## 2011-03-18 NOTE — Progress Notes (Signed)
  Subjective:    Patient ID: Erin [name], female    DOB: Feb 13, 1966, 46 y.o.   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 She found an old albuterol inhaler and has been using it for cough and congestion. Some wheezing. Sounds hoarse. Slight sore throat. Had some leftover Hycodan for cough. Has some discolored sputum production. Coughing a great deal in the office today. History of allergic rhinitis and urticaria. History of migraine headache. History of hemorrhoids. No fever or shaking chills. Had influenza immunization at work.    Review of Systems     Objective:   Physical Exam HEENT exam: Pharynx is slightly injected; TMs are slightly full bilaterally; neck is supple. A quarter is present. Chest clear to auscultation. She is hoarse when she speaks and is coughing a great deal.        Assessment & Plan:  Asthmatic bronchitis  Laryngitis  Pharyngitis  Plan: Sterapred DS 10 mg 6 day dosepak. Refill Hycodan 8 ounces 1 teaspoon by mouth every 6 hours when necessary cough. Refill albuterol inhaler when necessary one year 2 sprays by mouth 4 times a day when necessary. Levaquin 500 mg daily for 10 days with one refill.

## 2011-03-18 NOTE — Patient Instructions (Addendum)
Take prednisone as directed in tapering course. Use Hycodan as needed for cough. Take Levaquin daily with a meal. Use albuterol inhaler as needed. Return if not better in 7 days or sooner if worse

## 2011-04-02 ENCOUNTER — Other Ambulatory Visit: Payer: Self-pay | Admitting: Internal Medicine

## 2011-04-14 ENCOUNTER — Ambulatory Visit (INDEPENDENT_AMBULATORY_CARE_PROVIDER_SITE_OTHER): Payer: 59 | Admitting: Internal Medicine

## 2011-04-14 ENCOUNTER — Encounter: Payer: Self-pay | Admitting: Internal Medicine

## 2011-04-14 VITALS — BP 116/88 | HR 80 | Wt 196.0 lb

## 2011-04-14 DIAGNOSIS — IMO0001 Reserved for inherently not codable concepts without codable children: Secondary | ICD-10-CM

## 2011-04-14 DIAGNOSIS — R030 Elevated blood-pressure reading, without diagnosis of hypertension: Secondary | ICD-10-CM

## 2011-04-14 DIAGNOSIS — G43909 Migraine, unspecified, not intractable, without status migrainosus: Secondary | ICD-10-CM

## 2011-04-14 LAB — T4, FREE: Free T4: 0.79 ng/dL — ABNORMAL LOW (ref 0.80–1.80)

## 2011-04-14 LAB — COMPREHENSIVE METABOLIC PANEL
AST: 22 U/L (ref 0–37)
Albumin: 3.9 g/dL (ref 3.5–5.2)
Alkaline Phosphatase: 51 U/L (ref 39–117)
CO2: 25 mEq/L (ref 19–32)
Glucose, Bld: 84 mg/dL (ref 70–99)
Potassium: 3.7 mEq/L (ref 3.5–5.3)
Sodium: 137 mEq/L (ref 135–145)
Total Protein: 6.4 g/dL (ref 6.0–8.3)

## 2011-04-14 LAB — CBC WITH DIFFERENTIAL/PLATELET
Hemoglobin: 11.5 g/dL — ABNORMAL LOW (ref 12.0–15.0)
Lymphs Abs: 1.4 10*3/uL (ref 0.7–4.0)
Monocytes Relative: 13 % — ABNORMAL HIGH (ref 3–12)
Neutro Abs: 1.8 10*3/uL (ref 1.7–7.7)
Neutrophils Relative %: 47 % (ref 43–77)
RBC: 3.95 MIL/uL (ref 3.87–5.11)

## 2011-04-14 LAB — POCT URINALYSIS DIPSTICK
Bilirubin, UA: NEGATIVE
Glucose, UA: NEGATIVE
Leukocytes, UA: NEGATIVE
Nitrite, UA: NEGATIVE

## 2011-04-14 NOTE — Progress Notes (Signed)
  Subjective:    Patient ID: Erin [name], female    DOB: 03/24/1965, 46 y.o.   MRN: [id number]  HPI  About 2 weeks ago patient had a rather severe migraine headache associated with nausea and vomiting. She took Zomig and Fioricet and went on to work. For the past 2 weeks has had a dull left-sided headache that has been persistent. Has noted that her blood pressure is been elevated at times. She is working out regularly at the [employer]. She has a family history of hypertension in her father. Says blood pressure has been in the low 90s diastolically which is been concerning to her. Systolic pressure has been 120 to 130 when she had someone take it with a cuff at the hospital. Patient says she had some post pregnancy hypertension after the birth of her daughter and took some antihypertensive medication for short period of time prescribed by Dr. Elsie Lincoln until she could lose some weight. Doesn't remember the name of the medication. It was several years ago.   Review of Systems     Objective:   Physical Exam eyes:   has some mild arteriolar narrowing. No AV nicking. Disc are sharp and flat on funduscopic exam. TMs and pharynx are clear; neck is supple without JVD , has goiter, no carotid bruits. Cardiac exam regular rate and rhythm normal S1 and S2; extremities without edema. Blood pressure rechecked 124/84 left arm 120/80 right arm.        Assessment & Plan:  Protracted migraine headache  Borderline hypertension  EKG shows borderline LVH and she has a mild arteriolar narrowing.  Plan: Patient is going to try diet and exercise as well as a low salt diet. She's going to keep for now her blood pressure 2-3 times a week and return here in 3 months. CBC, C. met thyroid functions drawn today. Urinalysis is within normal limits.  For migraine headache, gave her Sterapred DS 10 mg 6 day dosepak. The pain she's been having could be aggravating her blood pressure.

## 2011-04-14 NOTE — Patient Instructions (Signed)
Take Sterapred DS 10 mg 6 day dosepak as prescribed. Continue to monitor your blood pressure 2-3 times per week. Recheck in 3 months. We have drawn CBC and see them at today along with thyroid functions. EKG has been performed. Urinalysis is normal. Try to lose weight and watch salt intake.

## 2011-05-11 ENCOUNTER — Encounter: Payer: Self-pay | Admitting: Internal Medicine

## 2011-05-11 ENCOUNTER — Other Ambulatory Visit: Payer: Self-pay | Admitting: Internal Medicine

## 2011-06-25 ENCOUNTER — Encounter: Payer: Self-pay | Admitting: *Deleted

## 2011-06-25 ENCOUNTER — Emergency Department
Admission: EM | Admit: 2011-06-25 | Discharge: 2011-06-25 | Disposition: A | Discharge status: Home / Self Care | Payer: 59 | Source: Home / Self Care | Attending: Emergency Medicine | Admitting: Emergency Medicine

## 2011-06-25 DIAGNOSIS — H6120 Impacted cerumen, unspecified ear: Secondary | ICD-10-CM

## 2011-06-25 NOTE — ED Notes (Signed)
The pt is here today for an ear lavage of her LT ear. She has used debrox and an at home irrigation kit with no relief.

## 2011-06-25 NOTE — ED Provider Notes (Signed)
History     CSN: [id number]  Arrival date & time 06/25/11  1815   First MD Initiated Contact with Patient 06/25/11 1830      Chief Complaint  Patient presents with  . 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  She has noticed that her hearing on the left side has been decreased recently.  She relates it to earwax buildup.  No pain, dizziness, ringing in her ears.  No upper respiratory symptoms.  She has been using the Debrox which has been able to get a little bit of ear wax out.  But her hearing is still sounds muffled.  Past Medical History  Diagnosis Date  . Thyroid disease   . Dysmenorrhea   . Migraine   . Fibrocystic breast disease   . Allergy   . Hemorrhoids     Past Surgical History  Procedure Date  . Tubal ligation   . Breast surgery     Family History  Problem Relation Age of Onset  . Diabetes Mother   . Hypertension Mother   . Hypertension Father   . Diabetes Father     History  Substance Use Topics  . Smoking status: Never Smoker   . Smokeless tobacco: Not on file  . Alcohol Use: No    OB History    Grav Para Term Preterm Abortions TAB SAB Ect Mult Living                  Review of Systems  All other systems reviewed and are negative.    Allergies  Aspirin and Nsaids  Home Medications   Current Outpatient Rx  Name Route Sig Dispense Refill  . BUTALBITAL-APAP-CAFFEINE 50-325-40 MG PO TABS Oral Take 1 tablet by mouth 2 (two) times daily as needed.      Marland Kitchen VITAMIN D3 2000 UNITS PO CAPS Oral Take 2,000 Units by mouth daily.      . DESLORATADINE 5 MG PO TABS Oral Take 5 mg by mouth daily.      Marland Kitchen DIPHENHYDRAMINE HCL 50 MG PO CAPS Oral Take 50 mg by mouth every 6 (six) hours as needed.    Marland Kitchen FLUTICASONE PROPIONATE 50 MCG/ACT NA SUSP Nasal Place 2 sprays into the nose daily.      Marland Kitchen HYDROCORTISONE ACETATE 25  MG RE SUPP  UNWRAP AND INSERT 1 SUPPOSITORY RECTALLY TWICE DAILY 12 suppository 11  . MUPIROCIN 2 % EX OINT  Apply to affected area 3 times daily 22 g 0  . PROMETHAZINE HCL 25 MG PO TABS Oral Take 25 mg by mouth every 6 (six) hours as needed.      . SUMATRIPTAN SUCCINATE 100 MG PO TABS Oral Take 100 mg by mouth every 2 (two) hours as needed.      . SUMATRIPTAN 20 MG/ACT NA SOLN Nasal Place 1 spray into the nose every 2 (two) hours as needed.      Marland Kitchen ZOMIG 5 MG PO TABS  TAKE 1 TABLET BY MOUTH AT ONSET OF MIGRAINE AS DIRECTED 24 tablet 5    BP 135/87  Pulse 77  Temp(Src) 98.5 F (36.9 C) (Oral)  Resp 18  Ht 5\' 9"  (1.753 m)  Wt 193 lb 8 oz (87.771 kg)  BMI 28.57 kg/m2  SpO2 98%  LMP 06/18/2011  Physical Exam  Nursing note and vitals reviewed. Constitutional: She is oriented to person, place, and time. She appears well-developed and well-nourished.  HENT:  Head: Normocephalic and atraumatic.  Nose: Nose normal.  Mouth/Throat: Oropharynx is clear and moist.       Left ear canal is 100% impacted with cerumen.  The ear canal itself is normal.  The right ear canal has about 10% earwax impaction.  The right tympanic membrane is normal.    After your irrigation, the left and right ears are 100% clear of any cerumen.  The left distal ear canal has slight erythema but no signs of infection.  Eyes: No scleral icterus.  Neck: Neck supple.  Cardiovascular: Regular rhythm and normal heart sounds.   Pulmonary/Chest: Effort normal and breath sounds normal. No respiratory distress.  Neurological: She is alert and oriented to person, place, and time.  Skin: Skin is warm and dry.  Psychiatric: She has a normal mood and affect. Her speech is normal.    ED Course  Procedures (including critical care time)  Labs Reviewed - No data to display No results found.   1. Cerumen impaction       MDM   The left ear was irrigated and flushed.  No signs of otitis media or otitis externa.  Treatment  was successful.  Avoid putting anything in her ear.  Tylenol or ibuprofen for any discomfort.  Followup with ENT or PCP if further problems.  If any pain is increasing, then an antibiotic/steroid ear drop would be appropriate.     Marlaine Hind, MD 06/25/11 2003

## 2011-08-07 ENCOUNTER — Telehealth: Payer: Self-pay

## 2011-08-07 MED ORDER — FLUCONAZOLE 150 MG PO TABS: 150.0000 mg | ORAL_TABLET | Freq: Once | ORAL | Status: AC

## 2011-08-07 NOTE — Telephone Encounter (Signed)
Call in Diflucan 150 mg tablet with 2 refills. Sig:one tablet po for Candida vaginitis.

## 2011-08-07 NOTE — Telephone Encounter (Signed)
Patient requesting a Rx for Diflucan, a she is certain she has a yeast infection. Vaginal itching present

## 2011-08-07 NOTE — Telephone Encounter (Signed)
Rx for Diflucan 150mg  sent to [facility]. Patient advised

## 2011-08-17 ENCOUNTER — Other Ambulatory Visit (HOSPITAL_BASED_OUTPATIENT_CLINIC_OR_DEPARTMENT_OTHER): Payer: Self-pay | Admitting: General Surgery

## 2011-08-17 DIAGNOSIS — E049 Nontoxic goiter, unspecified: Secondary | ICD-10-CM

## 2011-09-29 ENCOUNTER — Encounter (HOSPITAL_BASED_OUTPATIENT_CLINIC_OR_DEPARTMENT_OTHER): Payer: Self-pay | Admitting: *Deleted

## 2011-09-29 ENCOUNTER — Emergency Department (HOSPITAL_BASED_OUTPATIENT_CLINIC_OR_DEPARTMENT_OTHER)
Admission: EM | Admit: 2011-09-29 | Discharge: 2011-09-29 | Disposition: A | Discharge status: Home / Self Care | Payer: 59 | Attending: Emergency Medicine | Admitting: Emergency Medicine

## 2011-09-29 DIAGNOSIS — G43009 Migraine without aura, not intractable, without status migrainosus: Secondary | ICD-10-CM | POA: Insufficient documentation

## 2011-09-29 DIAGNOSIS — Z91013 Allergy to seafood: Secondary | ICD-10-CM | POA: Insufficient documentation

## 2011-09-29 DIAGNOSIS — Z886 Allergy status to analgesic agent status: Secondary | ICD-10-CM | POA: Insufficient documentation

## 2011-09-29 DIAGNOSIS — E079 Disorder of thyroid, unspecified: Secondary | ICD-10-CM | POA: Insufficient documentation

## 2011-09-29 MED ORDER — METOCLOPRAMIDE HCL 5 MG/ML IJ SOLN
10.0000 mg | Freq: Once | INTRAMUSCULAR | Status: AC
  Administered 2011-09-29: 10 mg via INTRAVENOUS
  Filled 2011-09-29: qty 2

## 2011-09-29 MED ORDER — ONDANSETRON HCL 4 MG/2ML IJ SOLN
4.0000 mg | Freq: Once | INTRAMUSCULAR | Status: AC
  Administered 2011-09-29: 4 mg via INTRAVENOUS
  Filled 2011-09-29: qty 2

## 2011-09-29 MED ORDER — DEXAMETHASONE SODIUM PHOSPHATE 10 MG/ML IJ SOLN
10.0000 mg | Freq: Once | INTRAMUSCULAR | Status: AC
  Administered 2011-09-29: 10 mg via INTRAVENOUS
  Filled 2011-09-29: qty 1

## 2011-09-29 MED ORDER — DIPHENHYDRAMINE HCL 50 MG/ML IJ SOLN
12.5000 mg | Freq: Once | INTRAMUSCULAR | Status: AC
  Administered 2011-09-29: 12.5 mg via INTRAVENOUS
  Filled 2011-09-29: qty 1

## 2011-09-29 NOTE — ED Notes (Signed)
Pt. Reports her headache is some better as well as the nausea.  Dr. Clinton Sawyer aware.

## 2011-09-29 NOTE — ED Notes (Signed)
Headache since this afternoon. Nausea.

## 2011-09-29 NOTE — ED Provider Notes (Signed)
History     CSN: [id number]  Arrival date & time 09/29/11  4098   First MD Initiated Contact with Patient 09/29/11 1917      Chief Complaint  Patient presents with  . Headache    (Consider location/radiation/quality/duration/timing/severity/associated sxs/prior treatment) HPI  The patient is a 46 year old female with a history of migraine headaches presents with severe headache. The headache is left-sided in the occipital region. It does not radiate. It started at 1 PM. It is refractory to Fiorcet and Frovatriptan. It is associated with nausea and vomiting. It is not associated with photophobia. The patient is a this is distinct from her normal headaches because of its location. Normally her migraine headaches are frontal and bilateral. Her migraine headaches are normally associated with her menstrual period, which she is currently having.   Past Medical History  Diagnosis Date  . Thyroid disease   . Dysmenorrhea   . Migraine   . Fibrocystic breast disease   . Allergy   . Hemorrhoids     Past Surgical History  Procedure Date  . Tubal ligation   . Breast surgery     Family History  Problem Relation Age of Onset  . Diabetes Mother   . Hypertension Mother   . Hypertension Father   . Diabetes Father     History  Substance Use Topics  . Smoking status: Never Smoker   . Smokeless tobacco: Not on file  . Alcohol Use: No    OB History    Grav Para Term Preterm Abortions TAB SAB Ect Mult Living                  Review of Systems  All other systems reviewed and are negative.    Allergies  Aspirin; Nsaids; and Shellfish allergy  Home Medications   Current Outpatient Rx  Name Route Sig Dispense Refill  . ACETAMINOPHEN 500 MG PO TABS Oral Take 1,000 mg by mouth every 6 (six) hours as needed. For pain/headache    . ALBUTEROL SULFATE HFA 108 (90 BASE) MCG/ACT IN AERS Inhalation Inhale 2 puffs into the lungs every 4 (four) hours as needed. For shortness of breath     . BUTALBITAL-APAP-CAFFEINE 50-325-40 MG PO TABS Oral Take 1 tablet by mouth 2 (two) times daily as needed. For migraine    . VITAMIN D-3 PO Oral Take 1 tablet by mouth daily.    Marland Kitchen DIPHENHYDRAMINE HCL 50 MG PO CAPS Oral Take 50 mg by mouth every 6 (six) hours as needed. For sleep    . FLUTICASONE PROPIONATE 50 MCG/ACT NA SUSP Nasal Place 2 sprays into the nose daily as needed. For allergies    . FROVATRIPTAN SUCCINATE 2.5 MG PO TABS Oral Take 2.5 mg by mouth as needed. If recurs, may repeat after 2 hours. Max of 3 tabs in 24 hours.    Marland Kitchen HYDROCORTISONE ACETATE 25 MG RE SUPP Rectal Place 25 mg rectally 2 (two) times daily.    Marland Kitchen LEVOTHYROXINE SODIUM 25 MCG PO TABS Oral Take 25 mcg by mouth daily.    Marland Kitchen ZOLMITRIPTAN 5 MG PO TABS Oral Take 5 mg by mouth daily as needed. At onset of migraine      BP 118/80  Pulse 78  Temp 97.8 F (36.6 C) (Oral)  Resp 16  SpO2 100%  Physical Exam  Vitals reviewed. Constitutional: She is oriented to person, place, and time. She appears well-developed and well-nourished. She appears distressed.  HENT:  Head: Normocephalic and atraumatic.  Right Ear: External ear normal.  Left Ear: External ear normal.  Mouth/Throat: Oropharynx is clear and moist. No oropharyngeal exudate.  Eyes: Conjunctivae and EOM are normal. Pupils are equal, round, and reactive to light.  Neck: Normal range of motion.  Cardiovascular: Normal rate and regular rhythm.   Pulmonary/Chest: Effort normal and breath sounds normal.  Abdominal: She exhibits distension. There is no tenderness.       Actively vomiting during the exam  Musculoskeletal: Normal range of motion.  Neurological: She is alert and oriented to person, place, and time. No cranial nerve deficit. Coordination normal.  Skin: Skin is warm and dry. She is not diaphoretic.    ED Course  Procedures (including critical care time)  Labs Reviewed - No data to display No results found.   1. Migraine headache without aura       MDM  Mrs. Nolene Ebbs is a 46 year old female with a history of migraine headaches who was initially treated with Zofran, Reglan and Benadryl. This was successful in reducing her headache from a 10 out of 10 to a 1/10. Additionally she was given 10 mg of Decadron to prevent recurrent a headache. There were no concerns based on history and physical for secondary causes of the headache. Therefore the patient was stable and appropriate for discharge. She was encouraged to followup per PCP 4 initiation of a daily controller medication. The patient was in agreement with this plan.        Garnetta Buddy, MD 09/29/11 2123

## 2011-09-29 NOTE — ED Notes (Addendum)
Pt has hx of migraines that occur after her menstrual cycle. This is what brought her in today. It is worse than usual. Pt is pacing, sobbing, and stating, "my head is killing me." Refuses to sit on stretcher. Is sitting in corner rocking back and forth sobbing, pt cannot hold still. Also associated with N/V. Attempting to make herself throw up because she reports it may make her feel better.  Lights and sound worsen. Took her home meds and tried caffeine with not relief.

## 2011-09-29 NOTE — ED Notes (Signed)
Family at bedside. 

## 2011-09-30 NOTE — ED Provider Notes (Signed)
I saw and evaluated the patient, reviewed the resident's note and I agree with the findings and plan.  Daesean Lazarz, MD 09/30/11 1220 

## 2011-10-01 ENCOUNTER — Ambulatory Visit (INDEPENDENT_AMBULATORY_CARE_PROVIDER_SITE_OTHER): Payer: 59 | Admitting: Internal Medicine

## 2011-10-01 ENCOUNTER — Encounter: Payer: Self-pay | Admitting: Internal Medicine

## 2011-10-01 VITALS — BP 116/76 | HR 76 | Temp 98.2°F | Wt 198.0 lb

## 2011-10-01 DIAGNOSIS — G43919 Migraine, unspecified, intractable, without status migrainosus: Secondary | ICD-10-CM

## 2011-10-03 NOTE — Progress Notes (Signed)
  Subjective:    Patient ID: Erin [name], female    DOB: 1965/12/30, 46 y.o.   MRN: [id number]  HPI 46 year old black female with history of migraine headaches for a number of years. Generally able to control them taking Fioricet. Has a migraine about every other month at most. 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 Had onset of left occipital pain. Pain did not resolve and continued to get worse with pounding and throbbing. Was not like any previous migraine she has had which was usually frontal and throbbing. She went home and took Frova which causes numbness and tingling. Imitrex also causes side effects. She began to have nausea and vomiting. She became alarmed and went to med center [facility] for evaluation. Was given Reglan, Phenergan and she thinks perhaps an injection of steroids although that is not documented in the note I have reviewed. Today feels much better. In today to discuss management of migraine headaches. Says that she recently has been placed on Synthroid by endocrinologist.  Brings in FMLA form to be completed at time of visit    Review of Systems     Objective:   Physical Exam She is alert and oriented x3. Cranial nerves II through XII grossly intact. Extraocular movements are full. PERRLA. Skin is warm and dry. TMs and pharynx are clear. Neck is supple. Mild thyromegaly. Chest clear to auscultation. Cardiac exam regular rate and rhythm. Deep tendon reflexes 2+ and symmetrical. Muscle strength is 5 over 5 in all groups tested. Gait is normal. Affect is normal.       Assessment & Plan:  Migraine headaches -the issue at this point is not taking Frova early enough in migraine getting out of control. She was afraid to take Frova at work because it causes some side effects of numbness and tingling. She has Fioricet to take but will only take one tablet at work because she's afraid to take 2 tablets and dry. The next  time she develops a migraine at work, she should try to go home immediately says she's afraid to take medication on the job. FMLA form was completed for her today during the visit. (This took 20 minutes to complete FMLA) It took 20 minutes to speak with her and examined her  Prescribed Phenergan 25 mg tablets (#30) 1 by mouth every 4 hours when necessary nausea associated with migraine. She does have Fioricet on hand for pain control as well as Frova

## 2011-10-03 NOTE — Patient Instructions (Signed)
If migraine develops on the job, try to go home immediately and take triptan medication. If no relief within 1 hour. Take Fioricet. Prescribed Phenergan 25 mg tablets today for nausea to have on hand at home.

## 2011-10-08 ENCOUNTER — Other Ambulatory Visit: Payer: Self-pay | Admitting: Internal Medicine

## 2011-10-15 ENCOUNTER — Other Ambulatory Visit: Payer: Self-pay | Admitting: Obstetrics and Gynecology

## 2011-10-15 DIAGNOSIS — Z1231 Encounter for screening mammogram for malignant neoplasm of breast: Secondary | ICD-10-CM

## 2011-11-02 ENCOUNTER — Other Ambulatory Visit (HOSPITAL_BASED_OUTPATIENT_CLINIC_OR_DEPARTMENT_OTHER): Payer: Self-pay | Admitting: Endocrinology

## 2011-11-02 ENCOUNTER — Ambulatory Visit (HOSPITAL_BASED_OUTPATIENT_CLINIC_OR_DEPARTMENT_OTHER)
Admission: RE | Admit: 2011-11-02 | Discharge: 2011-11-02 | Disposition: A | Discharge status: Home / Self Care | Payer: 59 | Source: Ambulatory Visit | Attending: General Surgery | Admitting: General Surgery

## 2011-11-02 DIAGNOSIS — E049 Nontoxic goiter, unspecified: Secondary | ICD-10-CM

## 2011-11-06 ENCOUNTER — Ambulatory Visit: Payer: 59 | Admitting: Obstetrics and Gynecology

## 2011-11-06 ENCOUNTER — Ambulatory Visit: Payer: Self-pay | Admitting: Obstetrics and Gynecology

## 2011-11-06 ENCOUNTER — Encounter: Payer: Self-pay | Admitting: Obstetrics and Gynecology

## 2011-11-06 VITALS — BP 120/60 | HR 72 | Resp 16 | Ht 69.0 in | Wt 198.0 lb

## 2011-11-06 DIAGNOSIS — N898 Other specified noninflammatory disorders of vagina: Secondary | ICD-10-CM

## 2011-11-06 DIAGNOSIS — N760 Acute vaginitis: Secondary | ICD-10-CM

## 2011-11-06 DIAGNOSIS — B9689 Other specified bacterial agents as the cause of diseases classified elsewhere: Secondary | ICD-10-CM

## 2011-11-06 MED ORDER — METRONIDAZOLE 500 MG PO TABS: 500.0000 mg | ORAL_TABLET | Freq: Two times a day (BID) | ORAL | Status: DC

## 2011-11-06 NOTE — Progress Notes (Unsigned)
Regular Periods: {yes no:314532} Mammogram: {YES NO:22349}  Monthly Breast Ex.: {yes ZO:109604} Exercise: {yes no:314532}  Tetanus < 10 years: {yes no:314532} Seatbelts: {yes no:314532}  NI. Bladder Functn.: {yes no:314532} Abuse at home: {yes no:314532}  Daily BM's: {yes no:314532} Stressful Work: {yes no:314532}  Healthy Diet: {yes no:314532} Sigmoid-Colonoscopy: ***  Calcium: {yes no:314532} Medical problems this year: ***   LAST PAP:11/06/2010 WNL  Contraception: BTL  Mammogram:  11/2010 WNL  PCP: Eden Emms [facility]ley  PMH: NONE  FMH: NONE  Last Bone Scan: NONE

## 2011-11-06 NOTE — Progress Notes (Unsigned)
Subjective:    Erin [name] is a 46 y.o. female, No obstetric history on file., who presents for an annual exam.     History   Social History  . Marital Status: Married    Spouse Name: N/A    Number of Children: N/A  . Years of Education: N/A   Social History Main Topics  . Smoking status: Never Smoker   . Smokeless tobacco: None  . Alcohol Use: Yes  . Drug Use: No  . Sexually Active: Yes    Birth Control/ Protection: Surgical   Other Topics Concern  . None   Social History Narrative  . None    Menstrual cycle:   LMP: Patient's last menstrual period was 10/12/2011.           Cycle:28 days and moderate flow.  The following portions of the patient's history were reviewed and updated as appropriate: allergies, current medications, past family history, past medical history, past social history, past surgical history and problem list.  Review of Systems Pertinent items are noted in HPI. Breast:Negative for breast lump,nipple discharge or nipple retraction Gastrointestinal: Negative for abdominal pain, change in bowel habits or rectal bleeding Urinary:negative   Objective:    BP 120/60  Pulse 72  Resp 16  Ht 5\' 9"  (1.753 m)  Wt 198 lb (89.812 kg)  BMI 29.24 kg/m2  LMP 10/12/2011    Weight:  Wt Readings from Last 1 Encounters:  11/06/11 198 lb (89.812 kg)          BMI: Body mass index is 29.24 kg/(m^2).  General Appearance: Alert, appropriate appearance for age. No acute distress HEENT: Grossly normal Neck / Thyroid: Supple, no masses, nodes or enlargement Lungs: clear to auscultation bilaterally Back: No CVA tenderness Breast Exam: No dimpling, nipple retraction or discharge. No masses or nodes. and No masses or nodes.No dimpling, nipple retraction or discharge. Cardiovascular: Regular rate and rhythm. S1, S2, no murmur Gastrointestinal: Soft, non-tender, no masses or organomegaly Pelvic Exam: normal Rectovaginal: not indicated and normal rectal, no  masses Lymphatic Exam: Non-palpable nodes in neck, clavicular, axillary, or inguinal regions Skin: no rash or abnormalities Neurologic: Normal gait and speech, no tremor  Psychiatric: Alert and oriented, appropriate affect.   Wet Prep:positive clue cells Urinalysis:not applicable UPT: N/A, Not done   Assessment:    Normal gyn exam   BV Vaginal infection. Plan:   Mammogram:  April 2013 - neg  Pap 2012 - neg STD screening:declined BV: tx Flagyl 500mg  po BID x 7 days. Contraception:had BTL F/u x 1 year      Mattea Seger, CNM.

## 2011-12-17 ENCOUNTER — Ambulatory Visit (HOSPITAL_BASED_OUTPATIENT_CLINIC_OR_DEPARTMENT_OTHER): Payer: 59

## 2011-12-17 ENCOUNTER — Ambulatory Visit
Admission: RE | Admit: 2011-12-17 | Discharge: 2011-12-17 | Disposition: A | Discharge status: Home / Self Care | Payer: 59 | Source: Ambulatory Visit | Attending: Obstetrics and Gynecology | Admitting: Obstetrics and Gynecology

## 2011-12-17 DIAGNOSIS — Z1231 Encounter for screening mammogram for malignant neoplasm of breast: Secondary | ICD-10-CM

## 2011-12-24 ENCOUNTER — Other Ambulatory Visit: Payer: Self-pay | Admitting: Internal Medicine

## 2011-12-24 ENCOUNTER — Encounter: Payer: Self-pay | Admitting: Internal Medicine

## 2011-12-24 ENCOUNTER — Ambulatory Visit (INDEPENDENT_AMBULATORY_CARE_PROVIDER_SITE_OTHER): Payer: 59 | Admitting: Internal Medicine

## 2011-12-24 VITALS — BP 116/80 | HR 84 | Temp 98.3°F | Ht 68.25 in | Wt 193.0 lb

## 2011-12-24 DIAGNOSIS — Z0000 Encounter for general adult medical examination without abnormal findings: Secondary | ICD-10-CM

## 2011-12-24 DIAGNOSIS — K649 Unspecified hemorrhoids: Secondary | ICD-10-CM

## 2011-12-24 DIAGNOSIS — IMO0002 Reserved for concepts with insufficient information to code with codable children: Secondary | ICD-10-CM

## 2011-12-24 DIAGNOSIS — G43909 Migraine, unspecified, not intractable, without status migrainosus: Secondary | ICD-10-CM

## 2011-12-24 DIAGNOSIS — N6019 Diffuse cystic mastopathy of unspecified breast: Secondary | ICD-10-CM

## 2011-12-24 DIAGNOSIS — E049 Nontoxic goiter, unspecified: Secondary | ICD-10-CM

## 2011-12-24 DIAGNOSIS — E010 Iodine-deficiency related diffuse (endemic) goiter: Secondary | ICD-10-CM

## 2011-12-24 LAB — CBC WITH DIFFERENTIAL/PLATELET
Eosinophils Absolute: 0 10*3/uL (ref 0.0–0.7)
Eosinophils Relative: 1 % (ref 0–5)
HCT: 35 % — ABNORMAL LOW (ref 36.0–46.0)
Lymphocytes Relative: 36 % (ref 12–46)
Lymphs Abs: 1.2 10*3/uL (ref 0.7–4.0)
MCH: 29.5 pg (ref 26.0–34.0)
MCV: 86.6 fL (ref 78.0–100.0)
Monocytes Absolute: 0.5 10*3/uL (ref 0.1–1.0)
RBC: 4.04 MIL/uL (ref 3.87–5.11)
RDW: 13.8 % (ref 11.5–15.5)
WBC: 3.4 10*3/uL — ABNORMAL LOW (ref 4.0–10.5)

## 2011-12-24 LAB — POCT URINALYSIS DIPSTICK
Blood, UA: NEGATIVE
Ketones, UA: NEGATIVE
Spec Grav, UA: 1.015
Urobilinogen, UA: NEGATIVE

## 2011-12-24 LAB — COMPREHENSIVE METABOLIC PANEL
BUN: 7 mg/dL (ref 6–23)
CO2: 27 mEq/L (ref 19–32)
Calcium: 8.8 mg/dL (ref 8.4–10.5)
Chloride: 107 mEq/L (ref 96–112)
Creat: 0.61 mg/dL (ref 0.50–1.10)
Total Bilirubin: 0.4 mg/dL (ref 0.3–1.2)

## 2011-12-24 LAB — LIPID PANEL
Cholesterol: 168 mg/dL (ref 0–200)
HDL: 55 mg/dL (ref >39)
Total CHOL/HDL Ratio: 3.1 Ratio
Triglycerides: 52 mg/dL (ref <150)

## 2011-12-24 NOTE — Patient Instructions (Addendum)
Continue same meds and return in one year. 

## 2011-12-25 LAB — IRON AND TIBC
%SAT: 17 % — ABNORMAL LOW (ref 20–55)
TIBC: 338 ug/dL (ref 250–470)

## 2011-12-25 LAB — VITAMIN D 25 HYDROXY (VIT D DEFICIENCY, FRACTURES): Vit D, 25-Hydroxy: 28 ng/mL — ABNORMAL LOW (ref 30–89)

## 2012-02-01 ENCOUNTER — Other Ambulatory Visit: Payer: Self-pay | Admitting: Internal Medicine

## 2012-03-10 ENCOUNTER — Ambulatory Visit (INDEPENDENT_AMBULATORY_CARE_PROVIDER_SITE_OTHER): Payer: 59 | Admitting: Internal Medicine

## 2012-03-10 ENCOUNTER — Encounter: Payer: Self-pay | Admitting: Internal Medicine

## 2012-03-10 VITALS — BP 126/82 | HR 80 | Temp 98.6°F | Wt 195.0 lb

## 2012-03-10 DIAGNOSIS — J069 Acute upper respiratory infection, unspecified: Secondary | ICD-10-CM

## 2012-03-10 DIAGNOSIS — J40 Bronchitis, not specified as acute or chronic: Secondary | ICD-10-CM

## 2012-03-10 MED ORDER — MOXIFLOXACIN HCL 400 MG PO TABS: 400.0000 mg | ORAL_TABLET | Freq: Every day | ORAL | Status: DC

## 2012-03-10 NOTE — Progress Notes (Signed)
  Subjective:    Patient ID: Erin [name], female    DOB: Jun 20, 1965, 47 y.o.   MRN: [id number]  HPI URI symptoms onset on Sunday, December 29. No fever or shaking chills. Had cough and congestion. Slight sore throat. Daughter is been sick as well with cough. Cough is nonproductive. No wheezing.  Note: Patient claims that when she took Levaquin in the past that she had musculoskeletal pain.    Review of Systems     Objective:   Physical Exam HEENT exam: TMs are clear. Pharynx is clear. Neck is supple without adenopathy. Chest clear to auscultation without rales or wheezing.        Assessment & Plan:  URI  Bronchitis  Plan: Avelox 400 mg daily for 7 days. Hycodan 8 ounces 1 teaspoon by mouth every 6 hours when necessary cough.

## 2012-03-10 NOTE — Patient Instructions (Addendum)
Take Avelox 400 mg daily for 7 days. Take Hycodan syrup 1 teaspoon every 8 hours as needed for cough. Call if not better in 7-10 days or sooner if worse. Use albuterol inhaler if needed

## 2012-04-21 ENCOUNTER — Other Ambulatory Visit: Payer: Self-pay | Admitting: Internal Medicine

## 2012-05-22 ENCOUNTER — Encounter: Payer: Self-pay | Admitting: Internal Medicine
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
## 2012-08-08 ENCOUNTER — Other Ambulatory Visit: Payer: Self-pay | Admitting: Internal Medicine

## 2012-08-23 ENCOUNTER — Other Ambulatory Visit (HOSPITAL_BASED_OUTPATIENT_CLINIC_OR_DEPARTMENT_OTHER): Payer: Self-pay | Admitting: Endocrinology

## 2012-08-23 DIAGNOSIS — E049 Nontoxic goiter, unspecified: Secondary | ICD-10-CM

## 2012-09-01 ENCOUNTER — Ambulatory Visit: Payer: 59 | Admitting: Internal Medicine

## 2012-09-27 ENCOUNTER — Ambulatory Visit (INDEPENDENT_AMBULATORY_CARE_PROVIDER_SITE_OTHER): Payer: 59 | Admitting: Family Medicine

## 2012-09-27 ENCOUNTER — Encounter: Payer: Self-pay | Admitting: Family Medicine

## 2012-09-27 VITALS — BP 117/79 | HR 72 | Ht 69.0 in | Wt 193.0 lb

## 2012-09-27 DIAGNOSIS — M25561 Pain in right knee: Secondary | ICD-10-CM

## 2012-09-27 DIAGNOSIS — M25569 Pain in unspecified knee: Secondary | ICD-10-CM

## 2012-09-28 ENCOUNTER — Encounter: Payer: Self-pay | Admitting: Family Medicine

## 2012-09-28 DIAGNOSIS — M25561 Pain in right knee: Secondary | ICD-10-CM | POA: Insufficient documentation

## 2012-09-28 NOTE — Assessment & Plan Note (Signed)
minimal pain currently.  Location of pain along with activities that worsen (stairs, squatting, rowing machine) all consistent with patellar tendinopathy.  Shown home exercise program including eccentrics.  Declined PT and chopat strap for now.  Icing, nsaids as well.  Activities as tolerated.  F/u prn.

## 2012-09-28 NOTE — Patient Instructions (Addendum)
Instructions provided on handout from Sports Medicine Patient Advisor

## 2012-09-28 NOTE — Progress Notes (Signed)
Patient ID: Erin [name], female   DOB: 07-27-1965, 47 y.o.   MRN: [id number]  PCP: Margaree Mackintosh, MD  Subjective:   HPI: Patient is a 47 y.o. female here for right knee pain.  Patient reports she started having right knee pain about the end of June. Doesn't recall an acute injury. Pain noticed a day after starting to use a rowing machine which may have worsened this. Has been icing, using knee brace, biofreeze. Some focal swelling in front of knee. No catching, locking, giving out.  Past Medical History  Diagnosis Date  . Thyroid disease   . Dysmenorrhea   . Migraine   . Fibrocystic breast disease   . Allergy   . Hemorrhoids     Current Outpatient Prescriptions on File Prior to Visit  Medication Sig Dispense Refill  . acetaminophen (TYLENOL) 500 MG tablet Take 1,000 mg by mouth every 6 (six) hours as needed. For pain/headache      . albuterol (PROVENTIL HFA;VENTOLIN HFA) 108 (90 BASE) MCG/ACT inhaler Inhale 2 puffs into the lungs every 4 (four) hours as needed. For shortness of breath      . ANUCORT-HC 25 MG suppository UNWRAP AND INSERT 1 SUPPOSITORY RECTALLY TWICE DAILY  12 suppository  11  . butalbital-acetaminophen-caffeine (FIORICET, ESGIC) 50-325-40 MG per tablet TAKE 1 TO 2 TABLETS BY MOUTH EVERY 4 TO 6 HOURS AS NEEDED FOR HEADACHE (NOT TO EXCEED 6 TABS/24 HRS)  60 tablet  5  . Cholecalciferol (VITAMIN D-3 PO) Take 1 tablet by mouth daily.      . diphenhydrAMINE (BENADRYL) 50 MG capsule Take 50 mg by mouth every 6 (six) hours as needed. For sleep      . fluticasone (FLONASE) 50 MCG/ACT nasal spray USE 2 SPRAYS IN EACH NOSTRIL DAILY  48 g  PRN  . frovatriptan (FROVA) 2.5 MG tablet Take 2.5 mg by mouth as needed. If recurs, may repeat after 2 hours. Max of 3 tabs in 24 hours.      . hydrocortisone (ANUSOL-HC) 25 MG suppository Place 25 mg rectally 2 (two) times daily.      Marland Kitchen levothyroxine (SYNTHROID, LEVOTHROID) 25 MCG tablet Take 25 mcg by mouth daily.      Marland Kitchen PROCTOSOL  HC 2.5 % rectal cream APPLY 4 TIMES DAILY  28.35 g  PRN  . zolmitriptan (ZOMIG) 5 MG tablet Take 5 mg by mouth daily as needed. At onset of migraine       No current facility-administered medications on file prior to visit.    Past Surgical History  Procedure Laterality Date  . Tubal ligation    . Breast surgery      Allergies  Allergen Reactions  . Aspirin     REACTION: Hives, Wheezing  . Nsaids     REACTION: Hives, Wheezing  . Shellfish Allergy Other (See Comments)    Reaction unknown    History   Social History  . Marital Status: Married    Spouse Name: N/A    Number of Children: N/A  . Years of Education: N/A   Occupational History  . Not on file.   Social History Main Topics  . Smoking status: Never Smoker   . Smokeless tobacco: Not on file  . Alcohol Use: Yes  . Drug Use: No  . Sexually Active: Yes    Birth Control/ Protection: Surgical   Other Topics Concern  . Not on file   Social History Narrative  . No narrative on file  Family History  Problem Relation Age of Onset  . Diabetes Mother   . Hypertension Mother   . Hypertension Father   . Diabetes Father     BP 117/79  Pulse 72  Ht 5\' 9"  (1.753 m)  Wt 193 lb (87.544 kg)  BMI 28.49 kg/m2  Review of Systems: See HPI above.    Objective:  Physical Exam:  Gen: NAD  R knee: No gross deformity, ecchymoses, swelling.  No TTP currently FROM. Negative ant/post drawers. Negative valgus/varus testing. Negative lachmanns. Negative mcmurrays, apleys, patellar apprehension. NV intact distally.    Assessment & Plan:  1. Right knee pain - minimal pain currently.  Location of pain along with activities that worsen (stairs, squatting, rowing machine) all consistent with patellar tendinopathy.  Shown home exercise program including eccentrics.  Declined PT and chopat strap for now.  Icing, nsaids as well.  Activities as tolerated.  F/u prn.

## 2012-11-01 ENCOUNTER — Other Ambulatory Visit: Payer: Self-pay | Admitting: Obstetrics and Gynecology

## 2012-11-01 DIAGNOSIS — N920 Excessive and frequent menstruation with regular cycle: Secondary | ICD-10-CM

## 2012-11-02 ENCOUNTER — Ambulatory Visit (HOSPITAL_BASED_OUTPATIENT_CLINIC_OR_DEPARTMENT_OTHER)
Admission: RE | Admit: 2012-11-02 | Discharge: 2012-11-02 | Disposition: A | Discharge status: Home / Self Care | Payer: 59 | Source: Ambulatory Visit | Attending: Obstetrics and Gynecology | Admitting: Obstetrics and Gynecology

## 2012-11-02 DIAGNOSIS — N83209 Unspecified ovarian cyst, unspecified side: Secondary | ICD-10-CM | POA: Insufficient documentation

## 2012-11-02 DIAGNOSIS — N920 Excessive and frequent menstruation with regular cycle: Secondary | ICD-10-CM | POA: Insufficient documentation

## 2012-11-02 DIAGNOSIS — D259 Leiomyoma of uterus, unspecified: Secondary | ICD-10-CM | POA: Insufficient documentation

## 2012-11-04 ENCOUNTER — Other Ambulatory Visit: Payer: Self-pay | Admitting: Internal Medicine

## 2012-11-04 ENCOUNTER — Ambulatory Visit (INDEPENDENT_AMBULATORY_CARE_PROVIDER_SITE_OTHER): Payer: 59 | Admitting: Internal Medicine

## 2012-11-04 ENCOUNTER — Encounter: Payer: Self-pay | Admitting: Internal Medicine

## 2012-11-04 VITALS — BP 128/88 | HR 80 | Temp 97.3°F

## 2012-11-04 DIAGNOSIS — J069 Acute upper respiratory infection, unspecified: Secondary | ICD-10-CM

## 2012-11-04 DIAGNOSIS — Z1231 Encounter for screening mammogram for malignant neoplasm of breast: Secondary | ICD-10-CM

## 2012-11-04 MED ORDER — AZITHROMYCIN 250 MG PO TABS: ORAL_TABLET | ORAL | Status: DC

## 2012-11-04 MED ORDER — FLUTICASONE PROPIONATE 50 MCG/ACT NA SUSP: NASAL | Status: DC

## 2012-11-04 MED ORDER — METHYLPREDNISOLONE 4 MG PO TABS: 4.0000 mg | ORAL_TABLET | Freq: Every day | ORAL | Status: DC

## 2012-11-04 NOTE — Patient Instructions (Addendum)
Take meds as directed. Levaquin 500 milligrams daily for 7 days. Medrol 6 day dosepak as directed. Call if not better in one week

## 2012-11-05 NOTE — Progress Notes (Signed)
  Subjective:    Patient ID: Erin [name], female    DOB: 02-22-1966, 47 y.o.   MRN: [id number]  HPI 47 year old Black female has been on vacation this week but has come down with a URI. Has sore throat and some discolored nasal drainage. Some cough. Slight wheezing. No fever or chills.    Review of Systems     Objective:   Physical Exam  HEENT exam: Pharynx slightly injected. TMs are clear. Neck is supple. goiter present. Chest clear to auscultation. No rales or wheezing appreciated.        Assessment & Plan:  Acute URI  Plan: Medrol 4 mg 6 day dosepak for shortness of breath and wheezing. Levaquin 500 milligrams daily for 7 days.

## 2012-11-15 ENCOUNTER — Other Ambulatory Visit: Payer: Self-pay | Admitting: Internal Medicine

## 2012-11-15 DIAGNOSIS — Z1231 Encounter for screening mammogram for malignant neoplasm of breast: Secondary | ICD-10-CM

## 2012-11-22 ENCOUNTER — Encounter: Payer: Self-pay | Admitting: Internal Medicine

## 2012-11-22 ENCOUNTER — Ambulatory Visit (INDEPENDENT_AMBULATORY_CARE_PROVIDER_SITE_OTHER): Payer: 59 | Admitting: Internal Medicine

## 2012-11-22 VITALS — BP 132/90 | HR 92 | Temp 98.3°F

## 2012-11-22 DIAGNOSIS — J209 Acute bronchitis, unspecified: Secondary | ICD-10-CM

## 2012-11-22 MED ORDER — LEVOFLOXACIN 500 MG PO TABS: 500.0000 mg | ORAL_TABLET | Freq: Every day | ORAL | Status: DC

## 2012-11-22 MED ORDER — METHYLPREDNISOLONE ACETATE 80 MG/ML IJ SUSP
80.0000 mg | Freq: Once | INTRAMUSCULAR | Status: AC
  Administered 2012-11-22: 80 mg via INTRAMUSCULAR

## 2012-11-22 NOTE — Patient Instructions (Addendum)
Depomedrol 80 mg IM given today.  Take Levaquin for 7 days. Reduce dose if Achilles tendon hurts.

## 2012-11-22 NOTE — Progress Notes (Signed)
  Subjective:    Patient ID: Erin [name], female    DOB: 07/25/1965, 47 y.o.   MRN: [id number]  HPI   Treated for URI  On  8/29 with Z-pak which has been refilled once.   Also took Prednisone in a 6 day tapering course. Still coughing and has  Some hoarseness. Cough is not productive. No fever or shaking chills.      Review of Systems     Objective:   Physical Exam Pharynx is clear. TMs chronically scarred. Neck supple. Chest: clear without rales or wheezing        Assessment & Plan:   Impression: Acute bronchitis  Plan: Depo-Medrol 80 mg IM given in office. Levaquin 500 milligrams daily for 7 days. Consider allergy evaluation if symptoms persist

## 2012-11-29 ENCOUNTER — Encounter: Payer: Self-pay | Admitting: Internal Medicine

## 2012-12-02 ENCOUNTER — Other Ambulatory Visit: Payer: Self-pay | Admitting: Internal Medicine

## 2012-12-02 ENCOUNTER — Other Ambulatory Visit: Payer: Self-pay

## 2012-12-02 MED ORDER — HYDROCODONE-HOMATROPINE 5-1.5 MG/5ML PO SYRP: 5.0000 mL | ORAL_SOLUTION | Freq: Three times a day (TID) | ORAL | Status: DC | PRN

## 2012-12-07 ENCOUNTER — Ambulatory Visit (HOSPITAL_BASED_OUTPATIENT_CLINIC_OR_DEPARTMENT_OTHER)
Admission: RE | Admit: 2012-12-07 | Discharge: 2012-12-07 | Disposition: A | Discharge status: Home / Self Care | Payer: 59 | Source: Ambulatory Visit | Attending: Internal Medicine | Admitting: Internal Medicine

## 2012-12-07 ENCOUNTER — Ambulatory Visit (HOSPITAL_BASED_OUTPATIENT_CLINIC_OR_DEPARTMENT_OTHER): Payer: 59

## 2012-12-07 DIAGNOSIS — Z1231 Encounter for screening mammogram for malignant neoplasm of breast: Secondary | ICD-10-CM | POA: Insufficient documentation

## 2012-12-22 ENCOUNTER — Other Ambulatory Visit: Payer: 59 | Admitting: Internal Medicine

## 2012-12-26 ENCOUNTER — Encounter: Payer: Self-pay | Admitting: Internal Medicine

## 2012-12-27 ENCOUNTER — Other Ambulatory Visit: Payer: 59 | Admitting: Internal Medicine

## 2012-12-27 ENCOUNTER — Encounter: Payer: Self-pay | Admitting: Internal Medicine

## 2012-12-27 ENCOUNTER — Ambulatory Visit (INDEPENDENT_AMBULATORY_CARE_PROVIDER_SITE_OTHER): Payer: 59 | Admitting: Internal Medicine

## 2012-12-27 VITALS — BP 138/80 | HR 80 | Temp 98.5°F | Ht 69.0 in | Wt 194.0 lb

## 2012-12-27 DIAGNOSIS — E049 Nontoxic goiter, unspecified: Secondary | ICD-10-CM

## 2012-12-27 DIAGNOSIS — Z130 Encounter for screening for diseases of the blood and blood-forming organs and certain disorders involving the immune mechanism: Secondary | ICD-10-CM

## 2012-12-27 DIAGNOSIS — Z87898 Personal history of other specified conditions: Secondary | ICD-10-CM

## 2012-12-27 DIAGNOSIS — Z0000 Encounter for general adult medical examination without abnormal findings: Secondary | ICD-10-CM

## 2012-12-27 DIAGNOSIS — Z13220 Encounter for screening for lipoid disorders: Secondary | ICD-10-CM

## 2012-12-27 DIAGNOSIS — IMO0002 Reserved for concepts with insufficient information to code with codable children: Secondary | ICD-10-CM

## 2012-12-27 DIAGNOSIS — Z62810 Personal history of physical and sexual abuse in childhood: Secondary | ICD-10-CM

## 2012-12-27 DIAGNOSIS — J309 Allergic rhinitis, unspecified: Secondary | ICD-10-CM

## 2012-12-27 DIAGNOSIS — R05 Cough: Secondary | ICD-10-CM

## 2012-12-27 DIAGNOSIS — N6019 Diffuse cystic mastopathy of unspecified breast: Secondary | ICD-10-CM

## 2012-12-27 DIAGNOSIS — Z8669 Personal history of other diseases of the nervous system and sense organs: Secondary | ICD-10-CM

## 2012-12-27 DIAGNOSIS — Z8719 Personal history of other diseases of the digestive system: Secondary | ICD-10-CM

## 2012-12-27 DIAGNOSIS — Z1329 Encounter for screening for other suspected endocrine disorder: Secondary | ICD-10-CM

## 2012-12-27 DIAGNOSIS — R059 Cough, unspecified: Secondary | ICD-10-CM

## 2012-12-27 LAB — POCT URINALYSIS DIPSTICK
Leukocytes, UA: NEGATIVE
Protein, UA: NEGATIVE
Urobilinogen, UA: NEGATIVE

## 2012-12-27 LAB — COMPREHENSIVE METABOLIC PANEL
ALT: 10 U/L (ref 0–35)
AST: 13 U/L (ref 0–37)
Calcium: 8.9 mg/dL (ref 8.4–10.5)
Chloride: 107 mEq/L (ref 96–112)
Creat: 0.76 mg/dL (ref 0.50–1.10)
Potassium: 3.8 mEq/L (ref 3.5–5.3)
Sodium: 137 mEq/L (ref 135–145)
Total Protein: 6.7 g/dL (ref 6.0–8.3)

## 2012-12-27 LAB — CBC WITH DIFFERENTIAL/PLATELET
Basophils Absolute: 0 10*3/uL (ref 0.0–0.1)
Basophils Relative: 1 % (ref 0–1)
Eosinophils Relative: 2 % (ref 0–5)
HCT: 33.2 % — ABNORMAL LOW (ref 36.0–46.0)
MCHC: 33.4 g/dL (ref 30.0–36.0)
Monocytes Absolute: 0.5 10*3/uL (ref 0.1–1.0)
Neutro Abs: 1.7 10*3/uL (ref 1.7–7.7)
RDW: 15 % (ref 11.5–15.5)

## 2012-12-27 LAB — LIPID PANEL
Cholesterol: 170 mg/dL (ref 0–200)
LDL Cholesterol: 109 mg/dL — ABNORMAL HIGH (ref 0–99)
VLDL: 10 mg/dL (ref 0–40)

## 2012-12-27 NOTE — Patient Instructions (Addendum)
Have CXR at [facility]. If cough persists, pulmonary consultation. Otherwise return in one year or as needed.

## 2012-12-29 ENCOUNTER — Ambulatory Visit (HOSPITAL_BASED_OUTPATIENT_CLINIC_OR_DEPARTMENT_OTHER)
Admission: RE | Admit: 2012-12-29 | Discharge: 2012-12-29 | Disposition: A | Discharge status: Home / Self Care | Payer: 59 | Source: Ambulatory Visit | Attending: Internal Medicine | Admitting: Internal Medicine

## 2012-12-29 DIAGNOSIS — R059 Cough, unspecified: Secondary | ICD-10-CM | POA: Insufficient documentation

## 2012-12-29 DIAGNOSIS — R05 Cough: Secondary | ICD-10-CM | POA: Insufficient documentation
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
## 2013-01-12 ENCOUNTER — Encounter: Payer: Self-pay | Admitting: Internal Medicine

## 2013-02-06 ENCOUNTER — Other Ambulatory Visit: Payer: Self-pay | Admitting: Internal Medicine

## 2013-02-06 NOTE — Telephone Encounter (Signed)
Refilled Fioricet #60 with 5 refills to [facility]

## 2013-02-09 ENCOUNTER — Ambulatory Visit (HOSPITAL_BASED_OUTPATIENT_CLINIC_OR_DEPARTMENT_OTHER): Admission: RE | Admit: 2013-02-09 | Payer: 59 | Source: Ambulatory Visit

## 2013-02-09 ENCOUNTER — Ambulatory Visit (HOSPITAL_BASED_OUTPATIENT_CLINIC_OR_DEPARTMENT_OTHER)
Admission: RE | Admit: 2013-02-09 | Discharge: 2013-02-09 | Disposition: A | Discharge status: Home / Self Care | Payer: 59 | Source: Ambulatory Visit | Attending: Endocrinology | Admitting: Endocrinology

## 2013-02-09 DIAGNOSIS — E049 Nontoxic goiter, unspecified: Secondary | ICD-10-CM

## 2013-02-10 ENCOUNTER — Other Ambulatory Visit (HOSPITAL_BASED_OUTPATIENT_CLINIC_OR_DEPARTMENT_OTHER): Payer: 59

## 2013-02-21 ENCOUNTER — Ambulatory Visit (HOSPITAL_BASED_OUTPATIENT_CLINIC_OR_DEPARTMENT_OTHER)
Admission: RE | Admit: 2013-02-21 | Discharge: 2013-02-21 | Disposition: A | Discharge status: Home / Self Care | Payer: 59 | Source: Ambulatory Visit | Attending: Endocrinology | Admitting: Endocrinology

## 2013-02-21 DIAGNOSIS — E039 Hypothyroidism, unspecified: Secondary | ICD-10-CM | POA: Insufficient documentation

## 2013-02-21 DIAGNOSIS — E049 Nontoxic goiter, unspecified: Secondary | ICD-10-CM | POA: Insufficient documentation

## 2013-03-27 ENCOUNTER — Encounter: Payer: Self-pay | Admitting: Internal Medicine

## 2013-03-27 ENCOUNTER — Ambulatory Visit (INDEPENDENT_AMBULATORY_CARE_PROVIDER_SITE_OTHER): Payer: 59 | Admitting: Internal Medicine

## 2013-03-27 VITALS — BP 136/78 | HR 84 | Temp 99.8°F | Wt 194.0 lb

## 2013-03-27 DIAGNOSIS — R112 Nausea with vomiting, unspecified: Secondary | ICD-10-CM

## 2013-03-27 DIAGNOSIS — J111 Influenza due to unidentified influenza virus with other respiratory manifestations: Secondary | ICD-10-CM

## 2013-03-27 DIAGNOSIS — G43909 Migraine, unspecified, not intractable, without status migrainosus: Secondary | ICD-10-CM

## 2013-03-27 DIAGNOSIS — J209 Acute bronchitis, unspecified: Secondary | ICD-10-CM

## 2013-03-27 MED ORDER — AZITHROMYCIN 250 MG PO TABS: ORAL_TABLET | ORAL | Status: DC

## 2013-03-27 MED ORDER — OSELTAMIVIR PHOSPHATE 75 MG PO CAPS: 75.0000 mg | ORAL_CAPSULE | Freq: Two times a day (BID) | ORAL | Status: DC

## 2013-03-27 MED ORDER — PROMETHAZINE HCL 25 MG PO TABS: 25.0000 mg | ORAL_TABLET | Freq: Four times a day (QID) | ORAL | Status: DC | PRN

## 2013-03-27 MED ORDER — PROMETHAZINE HCL 25 MG/ML IJ SOLN
25.0000 mg | Freq: Once | INTRAMUSCULAR | Status: AC
  Administered 2013-03-27: 25 mg via INTRAMUSCULAR

## 2013-03-27 MED ORDER — HYDROCODONE-HOMATROPINE 5-1.5 MG/5ML PO SYRP: 5.0000 mL | ORAL_SOLUTION | Freq: Three times a day (TID) | ORAL | Status: DC | PRN

## 2013-03-27 NOTE — Patient Instructions (Addendum)
Take Tamiflu 75 mg twice daily for 5 days, Phenergan 25 mg tablets every 4 hours when necessary nausea, take Zithromax Z-PAK as directed. Take Hycodan as needed for cough. Out of work until feeling better and afebrile for 48 hours

## 2013-03-27 NOTE — Progress Notes (Signed)
   Subjective:    Patient ID: Erin [name], female    DOB: 1965/06/01, 48 y.o.   MRN: [id number]  HPI Awakened with myalgias, fever, cough and headache. Has history of migraine headaches. Vomited once. Feels like she has the flu. 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 She took one Fioricet for headache. Denies sore throat.    Review of Systems     Objective:   Physical Exam HEENT exam: Pharynx noninjected. TMs are clear. Neck is supple. Chest clear.        Assessment & Plan:  Influenza  Migraine headache  Plan: Tamiflu 75 mg twice daily for 5 days. Hycodan 8 ounces 1 teaspoon by mouth every 8 hours when necessary cough.  Phenergan 25 mg tablets #30 one by mouth every 4 hours when necessary nausea. Zithromax Z-Pak 2 tablets day one followed by 1 tablet days 2 through 5.

## 2013-03-28 ENCOUNTER — Ambulatory Visit: Payer: 59 | Admitting: *Deleted

## 2013-04-20 ENCOUNTER — Encounter: Payer: 59 | Attending: Internal Medicine | Admitting: Dietician

## 2013-04-20 ENCOUNTER — Encounter: Payer: Self-pay | Admitting: Dietician

## 2013-04-20 VITALS — Ht 69.0 in | Wt 196.2 lb

## 2013-04-20 DIAGNOSIS — Z713 Dietary counseling and surveillance: Secondary | ICD-10-CM | POA: Insufficient documentation

## 2013-04-20 DIAGNOSIS — E669 Obesity, unspecified: Secondary | ICD-10-CM | POA: Insufficient documentation

## 2013-04-20 DIAGNOSIS — Z6829 Body mass index (BMI) 29.0-29.9, adult: Secondary | ICD-10-CM | POA: Insufficient documentation

## 2013-04-20 DIAGNOSIS — E663 Overweight: Secondary | ICD-10-CM

## 2013-04-20 NOTE — Patient Instructions (Signed)
Goals: -Avoid buying chips and keeping them in the house; portion out chips and other snacks -Start logging food -Choose snacks with 1 serving of carb and 1 serving of protein -Veggies are free foods! -Keep working out! 

## 2013-04-20 NOTE — Progress Notes (Signed)
  Medical Nutrition Therapy:  Appt start time: 1630 end time:  2426.   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 Works at cardiac rehab at [employer]; hours: 8-4:30 M-F and some Saturdays. She lives with her husband and 49 year old daughter; her son is in college. They go out to eat once a week ([employer]). Cooks mostly: spaghetti, crock pot meals, baked pork chops or chicken, sometimes fried food.    Preferred Learning Style:  No preference indicated   Learning Readiness:  Ready  MEDICATIONS: see list   DIETARY INTAKE:  24-hr recall:  B ( AM): snickers bar; yogurt, granola bar, hot pockets, cereal, Nabs  Snk ( AM): sometimes; "Junk food" at the office; Nabs  L ( PM): pizza and fruit with sweet tea or water Snk ( PM): chips or fruit D ( PM): chicken casserole  Snk ( PM): chips, fruit  Beverages: water, sweet tea, Kool Aid, lemonade, orange juice  Usual physical activity: 2-3x per week to the [facility] or other gym; cardio, stretching, some weights   Estimated energy needs: 1800-2000 calories   Progress Towards Goal(s):  In progress.   Nutritional Diagnosis:  [place]-3.3 Overweight/obesity As related to inappropriate food choices.  As evidenced by BMI 29.    Intervention:  Nutrition education provided.  Teaching Method Utilized: Visual Auditory  Handouts given during visit include:  MyPlate  15 g CHO + protein snacks  Barriers to learning/adherence to lifestyle change: none  Demonstrated degree of understanding via:  Teach Back   Monitoring/Evaluation:  Dietary intake, exercise, and body weight in 4 month(s).

## 2013-05-18 ENCOUNTER — Other Ambulatory Visit: Payer: Self-pay | Admitting: Internal Medicine

## 2013-07-10 ENCOUNTER — Encounter: Payer: Self-pay | Admitting: Internal Medicine

## 2013-07-10 ENCOUNTER — Ambulatory Visit (INDEPENDENT_AMBULATORY_CARE_PROVIDER_SITE_OTHER): Payer: 59 | Admitting: Internal Medicine

## 2013-07-10 VITALS — BP 130/84 | HR 88 | Temp 98.8°F | Wt 196.0 lb

## 2013-07-10 DIAGNOSIS — IMO0001 Reserved for inherently not codable concepts without codable children: Secondary | ICD-10-CM

## 2013-07-10 DIAGNOSIS — L03019 Cellulitis of unspecified finger: Secondary | ICD-10-CM

## 2013-07-10 MED ORDER — DOXYCYCLINE HYCLATE 100 MG PO TABS: 100.0000 mg | ORAL_TABLET | Freq: Two times a day (BID) | ORAL | Status: DC

## 2013-07-10 MED ORDER — MUPIROCIN 2 % EX OINT: TOPICAL_OINTMENT | CUTANEOUS | Status: DC

## 2013-07-12 ENCOUNTER — Encounter: Payer: Self-pay | Admitting: Internal Medicine

## 2013-07-12 NOTE — Progress Notes (Signed)
   Subjective:    Patient ID: Erin [name], female    DOB: Sep 05, 1965, 48 y.o.   MRN: [id number]  HPI Has developed infection adjacent to right index finger nail. Apparently there was a hangnail there that she pulled off. She's had this previously but has been some time ago.    Review of Systems     Objective:   Physical Exam Cuticles are dry both hands. Mild paronychia right index finger medial aspect. Mild erythema.       Assessment & Plan:  Paronychia right index finger  Plan: Bactroban to use topically twice daily. Soaked in warm soapy water for 20 minutes twice daily. Doxycycline 100 mg twice daily for 10 days.

## 2013-07-12 NOTE — Patient Instructions (Signed)
Use Bactroban topically twice daily. Take doxycycline 100 mg twice daily for 10 days.

## 2013-07-21 ENCOUNTER — Telehealth: Payer: Self-pay

## 2013-07-21 NOTE — Telephone Encounter (Signed)
Finger still sore. Wants to know if she should refill her antobiotic; it has one refill.

## 2013-07-21 NOTE — Telephone Encounter (Signed)
Patient informed. 

## 2013-07-21 NOTE — Telephone Encounter (Signed)
She should refill it and continue soaking it. If not well in 7 days, we will check it.

## 2013-08-17 ENCOUNTER — Encounter: Payer: 59 | Attending: Internal Medicine | Admitting: Dietician

## 2013-08-17 VITALS — Ht 69.0 in | Wt 197.0 lb

## 2013-08-17 DIAGNOSIS — Z713 Dietary counseling and surveillance: Secondary | ICD-10-CM | POA: Diagnosis not present

## 2013-08-17 DIAGNOSIS — E663 Overweight: Secondary | ICD-10-CM | POA: Insufficient documentation

## 2013-08-17 NOTE — Progress Notes (Signed)
  Medical Nutrition Therapy:  Appt start time: 445 end time:  500.  Follow up: Oberia has maintained her weight since her last visit. She has been exercising and meeting her step goals. However, she reports her diet has been "not so good." She has been drinking more water at work and less sweet tea overall.  Preferred Learning Style:  No preference indicated   Learning Readiness:  Ready  MEDICATIONS: see list   DIETARY INTAKE:  24-hr recall:  B ( AM): [place] biscuits OR yogurt Snk ( AM): sometimes; "Junk food" at the office; Nabs  L ( PM): pizza and fruit with sweet tea or water Snk ( PM): chips or fruit D ( PM): chicken casserole  Snk ( PM): chips, fruit  Beverages: water, sweet tea (drinking less), Kool Aid, lemonade, orange juice  Usual physical activity: walking 3 miles 2x a week; [employer] 1x a week; 10,000 steps a day   Estimated energy needs: 1800-2000 calories   Progress Towards Goal(s):  In progress.   Nutritional Diagnosis:  [place]-3.3 Overweight/obesity As related to inappropriate food choices.  As evidenced by BMI 29.    Intervention:  Nutrition education provided.  Teaching Method Utilized: Visual Auditory   Barriers to learning/adherence to lifestyle change: none  Demonstrated degree of understanding via:  Teach Back   Monitoring/Evaluation:  Dietary intake, exercise, and body weight prn

## 2013-08-17 NOTE — Patient Instructions (Addendum)
Goals: -Avoid buying chips and keeping them in the house; portion out chips and other snacks -Start logging food -Choose snacks with 1 serving of carb and 1 serving of protein -Veggies are free foods! -Keep working out!

## 2013-09-05 ENCOUNTER — Telehealth: Payer: Self-pay

## 2013-09-05 NOTE — Telephone Encounter (Signed)
Sports medicine would probably be best.

## 2013-09-05 NOTE — Telephone Encounter (Signed)
Has some rt shoulder pain. Wants to know if you want to treat or should she return to Dr. Thomas Hoff, sports medicine MD.

## 2013-09-06 NOTE — Telephone Encounter (Signed)
Patient informed. 

## 2013-09-14 ENCOUNTER — Encounter: Payer: Self-pay | Admitting: Family Medicine

## 2013-09-14 ENCOUNTER — Ambulatory Visit (INDEPENDENT_AMBULATORY_CARE_PROVIDER_SITE_OTHER): Payer: 59 | Admitting: Family Medicine

## 2013-09-14 VITALS — BP 122/77 | HR 85 | Ht 69.0 in | Wt 196.0 lb

## 2013-09-14 DIAGNOSIS — M25519 Pain in unspecified shoulder: Secondary | ICD-10-CM

## 2013-09-14 DIAGNOSIS — M25512 Pain in left shoulder: Secondary | ICD-10-CM

## 2013-09-14 NOTE — Patient Instructions (Signed)
You have rotator cuff impingement, strain. Try to avoid painful activities (overhead activities, lifting with extended arm) as much as possible. Tylenol 500mg  1-2 tabs three times a day as needed for pain. Subacromial injection may be beneficial to help with pain and to decrease inflammation. Consider physical therapy with transition to home exercise program. Do home exercise program with theraband and scapular stabilization exercises daily - these are very important for long term relief even if an injection was given. If not improving at follow-up we will consider further imaging, physical therapy, injection and/or nitro patches. Follow up with me in 4-6 weeks.

## 2013-09-19 ENCOUNTER — Encounter: Payer: Self-pay | Admitting: Family Medicine

## 2013-09-19 DIAGNOSIS — M25512 Pain in left shoulder: Secondary | ICD-10-CM | POA: Insufficient documentation

## 2013-09-19 NOTE — Progress Notes (Signed)
Patient ID: Erin [name], female   DOB: 03-31-1965, 48 y.o.   MRN: [id number]  PCP: Elby Showers, MD  Subjective:   HPI: Patient is a 48 y.o. female here for left shoulder pain.  Patient denies known injury. She states over past 3 weeks has had worsening left shoulder pain. Feels frozen. Using heat at bedtime. Very sore to lift arm overhead. Left handed. Taking tylenol. No prior issues.  Past Medical History  Diagnosis Date  . Thyroid disease   . Dysmenorrhea   . Migraine   . Fibrocystic breast disease   . Allergy   . Hemorrhoids   . Asthma     Current Outpatient Prescriptions on File Prior to Visit  Medication Sig Dispense Refill  . butalbital-acetaminophen-caffeine (FIORICET, ESGIC) 50-325-40 MG per tablet TAKE 1 TO 2 TABLETS BY MOUTH EVERY 4 TO 6 HOURS AS NEEDED FOR HEADACHE (NOT TO EXCEED 6 TABS/24 HRS)  60 tablet  5  . Cholecalciferol (VITAMIN D-3 PO) Take 1 tablet by mouth daily.      . diphenhydrAMINE (BENADRYL) 50 MG capsule Take 50 mg by mouth every 6 (six) hours as needed. For sleep      . fluticasone (FLONASE) 50 MCG/ACT nasal spray USE 2 SPRAYS IN EACH NOSTRIL DAILY  48 g  PRN  . frovatriptan (FROVA) 2.5 MG tablet Take 2.5 mg by mouth as needed. If recurs, may repeat after 2 hours. Max of 3 tabs in 24 hours.      Marland Kitchen levothyroxine (SYNTHROID, LEVOTHROID) 50 MCG tablet Take 50 mcg by mouth daily before breakfast.      . mupirocin ointment (BACTROBAN) 2 % Apply to affected area 3 times daily  22 g  prn  . PROCTOSOL HC 2.5 % rectal cream APPLY RECTALLY 4 TIMES DAILY  28.35 g  PRN  . promethazine (PHENERGAN) 25 MG tablet Take 1 tablet (25 mg total) by mouth every 6 (six) hours as needed for nausea or vomiting.  30 tablet  0  . VENTOLIN HFA 108 (90 BASE) MCG/ACT inhaler INHALE 2 PUFFS BY MOUTH 4 TIMES A DAY  18 g  11  . zolmitriptan (ZOMIG) 5 MG tablet Take 5 mg by mouth daily as needed. At onset of migraine       No current facility-administered medications on file  prior to visit.    Past Surgical History  Procedure Laterality Date  . Tubal ligation    . Breast surgery      Allergies  Allergen Reactions  . Aspirin     REACTION: Hives, Wheezing  . Nsaids     REACTION: Hives, Wheezing  . Shellfish Allergy Other (See Comments)    Reaction unknown    History   Social History  . Marital Status: Married    Spouse Name: N/A    Number of Children: N/A  . Years of Education: N/A   Occupational History  . Not on file.   Social History Main Topics  . Smoking status: Never Smoker   . Smokeless tobacco: Not on file  . Alcohol Use: Yes  . Drug Use: No  . Sexual Activity: Yes    Birth Control/ Protection: Surgical   Other Topics Concern  . Not on file   Social History Narrative  . No narrative on file    Family History  Problem Relation Age of Onset  . Diabetes Mother   . Hypertension Mother   . Hypertension Father   . Diabetes Father   . Hyperlipidemia Other  BP 122/77  Pulse 85  Ht 5\' 9"  (1.753 m)  Wt 196 lb (88.905 kg)  BMI 28.93 kg/m2  Review of Systems: See HPI above.    Objective:  Physical Exam:  Gen: NAD  Left shoulder: No swelling, ecchymoses.  No gross deformity. No TTP. FROM with painful arc. Positive Hawkins, Neers. Negative Speeds, Yergasons. Strength 5/5 with empty can and resisted internal/external rotation.  Pain empty can. Negative apprehension. NV intact distally.    Assessment & Plan:  1. Left shoulder pain - 2/2 rotator cuff impingement, strain.  Start home exercise program.  Tylenol for pain.  Consider physical therapy, injection, nitro if not improving.  Follow up with me in 4-6 weeks.

## 2013-09-19 NOTE — Assessment & Plan Note (Signed)
2/2 rotator cuff impingement, strain.  Start home exercise program.  Tylenol for pain.  Consider physical therapy, injection, nitro if not improving.  Follow up with me in 4-6 weeks.

## 2013-09-21 ENCOUNTER — Other Ambulatory Visit: Payer: Self-pay | Admitting: Internal Medicine

## 2013-10-05 ENCOUNTER — Ambulatory Visit (INDEPENDENT_AMBULATORY_CARE_PROVIDER_SITE_OTHER): Payer: 59 | Admitting: Family Medicine

## 2013-10-05 ENCOUNTER — Encounter: Payer: Self-pay | Admitting: Family Medicine

## 2013-10-05 VITALS — BP 122/81 | HR 83 | Ht 69.0 in | Wt 195.0 lb

## 2013-10-05 DIAGNOSIS — M25512 Pain in left shoulder: Secondary | ICD-10-CM

## 2013-10-05 DIAGNOSIS — Z5189 Encounter for other specified aftercare: Secondary | ICD-10-CM

## 2013-10-05 DIAGNOSIS — M25519 Pain in unspecified shoulder: Secondary | ICD-10-CM

## 2013-10-05 DIAGNOSIS — S43429A Sprain of unspecified rotator cuff capsule, initial encounter: Secondary | ICD-10-CM

## 2013-10-05 DIAGNOSIS — S46012D Strain of muscle(s) and tendon(s) of the rotator cuff of left shoulder, subsequent encounter: Secondary | ICD-10-CM

## 2013-10-05 MED ORDER — METHYLPREDNISOLONE ACETATE 40 MG/ML IJ SUSP
40.0000 mg | Freq: Once | INTRAMUSCULAR | Status: AC
  Administered 2013-10-05: 40 mg via INTRA_ARTICULAR

## 2013-10-05 NOTE — Progress Notes (Signed)
Patient ID: Erin [name], female   DOB: 02/09/1966, 48 y.o.   MRN: [id number]  PCP: Elby Showers, MD  Subjective:   HPI: Patient is a 48 y.o. female here for left shoulder pain.  7/9: Patient denies known injury. She states over past 3 weeks has had worsening left shoulder pain. Feels frozen. Using heat at bedtime. Very sore to lift arm overhead. Left handed. Taking tylenol. No prior issues.  7/30: Patient returns today for shoulder injection. Still having pain with movement especially overhead. No new injuries or issues.  Past Medical History  Diagnosis Date  . Thyroid disease   . Dysmenorrhea   . Migraine   . Fibrocystic breast disease   . Allergy   . Hemorrhoids   . Asthma     Current Outpatient Prescriptions on File Prior to Visit  Medication Sig Dispense Refill  . butalbital-acetaminophen-caffeine (FIORICET, ESGIC) 50-325-40 MG per tablet TAKE 1 TO 2 TABLETS BY MOUTH EVERY 4 TO 6 HOURS AS NEEDED FOR HEADACHE (NOT TO EXCEED 6 TABS/24 HRS)  60 tablet  5  . Cholecalciferol (VITAMIN D-3 PO) Take 1 tablet by mouth daily.      . diphenhydrAMINE (BENADRYL) 50 MG capsule Take 50 mg by mouth every 6 (six) hours as needed. For sleep      . fluticasone (FLONASE) 50 MCG/ACT nasal spray USE 2 SPRAYS IN EACH NOSTRIL DAILY  48 g  PRN  . frovatriptan (FROVA) 2.5 MG tablet Take 2.5 mg by mouth as needed. If recurs, may repeat after 2 hours. Max of 3 tabs in 24 hours.      Marland Kitchen levothyroxine (SYNTHROID, LEVOTHROID) 50 MCG tablet Take 50 mcg by mouth daily before breakfast.      . mupirocin ointment (BACTROBAN) 2 % Apply to affected area 3 times daily  22 g  prn  . PROCTOSOL HC 2.5 % rectal cream APPLY RECTALLY 4 TIMES DAILY  28.35 g  PRN  . promethazine (PHENERGAN) 25 MG tablet Take 1 tablet (25 mg total) by mouth every 6 (six) hours as needed for nausea or vomiting.  30 tablet  0  . VENTOLIN HFA 108 (90 BASE) MCG/ACT inhaler INHALE 2 PUFFS BY MOUTH 4 TIMES A DAY  18 g  11  .  zolmitriptan (ZOMIG) 5 MG tablet Take 5 mg by mouth daily as needed. At onset of migraine      . ZOMIG 5 MG tablet TAKE 1 TABLET BY MOUTH AT ONSET OF MIGRAINE AS DIRECTED  24 tablet  5   No current facility-administered medications on file prior to visit.    Past Surgical History  Procedure Laterality Date  . Tubal ligation    . Breast surgery      Allergies  Allergen Reactions  . Aspirin     REACTION: Hives, Wheezing  . Nsaids     REACTION: Hives, Wheezing  . Shellfish Allergy Other (See Comments)    Reaction unknown    History   Social History  . Marital Status: Married    Spouse Name: N/A    Number of Children: N/A  . Years of Education: N/A   Occupational History  . Not on file.   Social History Main Topics  . Smoking status: Never Smoker   . Smokeless tobacco: Not on file  . Alcohol Use: Yes  . Drug Use: No  . Sexual Activity: Yes    Birth Control/ Protection: Surgical   Other Topics Concern  . Not on file   Social  History Narrative  . No narrative on file    Family History  Problem Relation Age of Onset  . Diabetes Mother   . Hypertension Mother   . Hypertension Father   . Diabetes Father   . Hyperlipidemia Other     BP 122/81  Pulse 83  Ht 5\' 9"  (1.753 m)  Wt 195 lb (88.451 kg)  BMI 28.78 kg/m2  Review of Systems: See HPI above.    Objective:  Physical Exam:  Gen: NAD Exam not repeated today. Left shoulder: No swelling, ecchymoses.  No gross deformity. No TTP. FROM with painful arc. Positive Hawkins, Neers. Negative Speeds, Yergasons. Strength 5/5 with empty can and resisted internal/external rotation.  Pain empty can. Negative apprehension. NV intact distally.    Assessment & Plan:  1. Left shoulder pain - 2/2 rotator cuff impingement, strain.  She would like to start PT as well as go ahead with injection which was given today. Consider physical therapy nitro if not improving.  Follow up with me in 4-6 weeks.  After informed  written consent, patient was seated on exam table. Left shoulder was prepped with alcohol swab and utilizing posterior approach, patient's left subacromial space was injected with 3:1 marcaine: depomedrol. Patient tolerated the procedure well without immediate complications.

## 2013-10-05 NOTE — Assessment & Plan Note (Signed)
2/2 rotator cuff impingement, strain.  She would like to start PT as well as go ahead with injection which was given today. Consider physical therapy nitro if not improving.  Follow up with me in 4-6 weeks.  After informed written consent, patient was seated on exam table. Left shoulder was prepped with alcohol swab and utilizing posterior approach, patient's left subacromial space was injected with 3:1 marcaine: depomedrol. Patient tolerated the procedure well without immediate complications.

## 2013-10-24 ENCOUNTER — Ambulatory Visit: Payer: 59 | Admitting: Family Medicine

## 2013-10-26 ENCOUNTER — Ambulatory Visit: Payer: 59 | Attending: Family Medicine | Admitting: Rehabilitation

## 2013-10-26 DIAGNOSIS — M25519 Pain in unspecified shoulder: Secondary | ICD-10-CM | POA: Diagnosis not present

## 2013-10-26 DIAGNOSIS — IMO0001 Reserved for inherently not codable concepts without codable children: Secondary | ICD-10-CM | POA: Diagnosis not present

## 2013-11-14 ENCOUNTER — Other Ambulatory Visit: Payer: Self-pay | Admitting: Internal Medicine

## 2013-11-14 DIAGNOSIS — Z1231 Encounter for screening mammogram for malignant neoplasm of breast: Secondary | ICD-10-CM

## 2013-12-06 ENCOUNTER — Telehealth: Payer: Self-pay

## 2013-12-06 NOTE — Telephone Encounter (Signed)
Patient called back needing FMLA forms filled out for migraines.  Forms filled out and sent.

## 2013-12-06 NOTE — Telephone Encounter (Signed)
Left message for patient to call office to let us know why she is needing FMLA.  Is it for migraines?

## 2013-12-12 ENCOUNTER — Ambulatory Visit (HOSPITAL_BASED_OUTPATIENT_CLINIC_OR_DEPARTMENT_OTHER)
Admission: RE | Admit: 2013-12-12 | Discharge: 2013-12-12 | Disposition: A | Discharge status: Home / Self Care | Payer: 59 | Source: Ambulatory Visit | Attending: Internal Medicine | Admitting: Internal Medicine

## 2013-12-12 DIAGNOSIS — Z1231 Encounter for screening mammogram for malignant neoplasm of breast: Secondary | ICD-10-CM | POA: Diagnosis not present

## 2013-12-19 ENCOUNTER — Other Ambulatory Visit: Payer: Self-pay | Admitting: Internal Medicine

## 2013-12-21 ENCOUNTER — Encounter: Payer: Self-pay | Admitting: Family Medicine

## 2013-12-28 ENCOUNTER — Other Ambulatory Visit: Payer: 59 | Admitting: Internal Medicine

## 2013-12-29 NOTE — Addendum Note (Signed)
Addended by: Amado Coe on: 12/29/2013 09:38 AM   Modules accepted: Orders

## 2014-01-01 ENCOUNTER — Other Ambulatory Visit: Payer: Self-pay | Admitting: Internal Medicine

## 2014-01-01 DIAGNOSIS — E049 Nontoxic goiter, unspecified: Secondary | ICD-10-CM

## 2014-01-01 DIAGNOSIS — Z130 Encounter for screening for diseases of the blood and blood-forming organs and certain disorders involving the immune mechanism: Secondary | ICD-10-CM

## 2014-01-01 DIAGNOSIS — Z1321 Encounter for screening for nutritional disorder: Secondary | ICD-10-CM

## 2014-01-01 DIAGNOSIS — Z1329 Encounter for screening for other suspected endocrine disorder: Secondary | ICD-10-CM

## 2014-01-01 DIAGNOSIS — Z13220 Encounter for screening for lipoid disorders: Secondary | ICD-10-CM

## 2014-01-01 DIAGNOSIS — Z0000 Encounter for general adult medical examination without abnormal findings: Secondary | ICD-10-CM

## 2014-01-02 ENCOUNTER — Encounter: Payer: Self-pay | Admitting: Internal Medicine

## 2014-01-02 ENCOUNTER — Ambulatory Visit (INDEPENDENT_AMBULATORY_CARE_PROVIDER_SITE_OTHER): Payer: 59 | Admitting: Internal Medicine

## 2014-01-02 VITALS — BP 130/92 | HR 76 | Temp 99.0°F | Ht 68.0 in | Wt 198.0 lb

## 2014-01-02 DIAGNOSIS — IMO0001 Reserved for inherently not codable concepts without codable children: Secondary | ICD-10-CM

## 2014-01-02 DIAGNOSIS — J309 Allergic rhinitis, unspecified: Secondary | ICD-10-CM

## 2014-01-02 DIAGNOSIS — N6019 Diffuse cystic mastopathy of unspecified breast: Secondary | ICD-10-CM

## 2014-01-02 DIAGNOSIS — Z8669 Personal history of other diseases of the nervous system and sense organs: Secondary | ICD-10-CM

## 2014-01-02 DIAGNOSIS — R030 Elevated blood-pressure reading, without diagnosis of hypertension: Secondary | ICD-10-CM

## 2014-01-02 DIAGNOSIS — Z8639 Personal history of other endocrine, nutritional and metabolic disease: Secondary | ICD-10-CM

## 2014-01-02 DIAGNOSIS — Z62810 Personal history of physical and sexual abuse in childhood: Secondary | ICD-10-CM

## 2014-01-02 DIAGNOSIS — Z0000 Encounter for general adult medical examination without abnormal findings: Secondary | ICD-10-CM

## 2014-01-02 LAB — POCT URINALYSIS DIPSTICK
Bilirubin, UA: NEGATIVE
Glucose, UA: NEGATIVE
Ketones, UA: NEGATIVE
Leukocytes, UA: NEGATIVE
Nitrite, UA: NEGATIVE
Protein, UA: NEGATIVE
Spec Grav, UA: 1.01
Urobilinogen, UA: NEGATIVE
pH, UA: 7

## 2014-01-02 LAB — CBC WITH DIFFERENTIAL/PLATELET
BASOS ABS: 0 10*3/uL (ref 0.0–0.1)
BASOS PCT: 0 % (ref 0–1)
Eosinophils Absolute: 0.1 10*3/uL (ref 0.0–0.7)
Eosinophils Relative: 2 % (ref 0–5)
HEMATOCRIT: 33.2 % — AB (ref 36.0–46.0)
HEMOGLOBIN: 10.7 g/dL — AB (ref 12.0–15.0)
LYMPHS PCT: 39 % (ref 12–46)
Lymphs Abs: 1.3 10*3/uL (ref 0.7–4.0)
MCH: 26.8 pg (ref 26.0–34.0)
MCHC: 32.2 g/dL (ref 30.0–36.0)
MCV: 83.2 fL (ref 78.0–100.0)
MONO ABS: 0.4 10*3/uL (ref 0.1–1.0)
MONOS PCT: 13 % — AB (ref 3–12)
Neutro Abs: 1.5 10*3/uL — ABNORMAL LOW (ref 1.7–7.7)
Neutrophils Relative %: 46 % (ref 43–77)
Platelets: 203 10*3/uL (ref 150–400)
RBC: 3.99 MIL/uL (ref 3.87–5.11)
RDW: 16.7 % — ABNORMAL HIGH (ref 11.5–15.5)
WBC: 3.3 10*3/uL — AB (ref 4.0–10.5)

## 2014-01-02 LAB — COMPREHENSIVE METABOLIC PANEL
ALK PHOS: 54 U/L (ref 39–117)
ALT: 9 U/L (ref 0–35)
AST: 13 U/L (ref 0–37)
Albumin: 3.9 g/dL (ref 3.5–5.2)
BUN: 8 mg/dL (ref 6–23)
CALCIUM: 8.5 mg/dL (ref 8.4–10.5)
CHLORIDE: 106 meq/L (ref 96–112)
CO2: 24 mEq/L (ref 19–32)
Creat: 0.64 mg/dL (ref 0.50–1.10)
Glucose, Bld: 93 mg/dL (ref 70–99)
POTASSIUM: 3.8 meq/L (ref 3.5–5.3)
Sodium: 139 mEq/L (ref 135–145)
TOTAL PROTEIN: 6.8 g/dL (ref 6.0–8.3)
Total Bilirubin: 0.3 mg/dL (ref 0.2–1.2)

## 2014-01-02 LAB — IRON AND TIBC
%SAT: 8 % — ABNORMAL LOW (ref 20–55)
Iron: 31 ug/dL — ABNORMAL LOW (ref 42–145)
TIBC: 387 ug/dL (ref 250–470)
UIBC: 356 ug/dL (ref 125–400)

## 2014-01-02 LAB — VITAMIN D 25 HYDROXY (VIT D DEFICIENCY, FRACTURES): Vit D, 25-Hydroxy: 53 ng/mL (ref 30–89)

## 2014-01-02 LAB — LIPID PANEL
CHOLESTEROL: 172 mg/dL (ref 0–200)
HDL: 50 mg/dL (ref >39)
LDL CALC: 107 mg/dL — AB (ref 0–99)
TRIGLYCERIDES: 76 mg/dL (ref <150)
Total CHOL/HDL Ratio: 3.4 Ratio
VLDL: 15 mg/dL (ref 0–40)

## 2014-01-02 LAB — TSH: TSH: 1.066 u[IU]/mL (ref 0.350–4.500)

## 2014-01-03 ENCOUNTER — Telehealth: Payer: Self-pay

## 2014-01-03 NOTE — Telephone Encounter (Signed)
Left message for patient to call office regarding adding FeSO 325mg  for low iron.

## 2014-01-03 NOTE — Telephone Encounter (Signed)
Message copied by Amado Coe on Wed Jan 03, 2014 11:46 AM ------      Message from: Elby Showers      Created: Wed Jan 03, 2014 11:21 AM       Please take FeSO$ 325 mg once or twice daily. Fe is low. ------

## 2014-01-16 ENCOUNTER — Other Ambulatory Visit: Payer: Self-pay | Admitting: Internal Medicine

## 2014-02-05 NOTE — Patient Instructions (Signed)
Return in one year or as needed. Keep an eye on blood pressure and let me know if persistently elevated.
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
## 2014-02-15 ENCOUNTER — Ambulatory Visit: Payer: 59 | Admitting: Family Medicine

## 2014-03-05 ENCOUNTER — Other Ambulatory Visit: Payer: Self-pay | Admitting: Internal Medicine

## 2014-03-05 NOTE — Telephone Encounter (Signed)
Call in #[phone] with 2 refills

## 2014-03-05 NOTE — Telephone Encounter (Signed)
Medication was called into pharmacy

## 2014-03-27 IMAGING — CR DG CHEST 2V
2 series · 2 of 2 positions shown · non-contrast
Comparison: Chest x-ray of 10/26/2003

CLINICAL DATA: Cough for 2 months

EXAM:
CHEST  2 VIEW

[w chest pa]
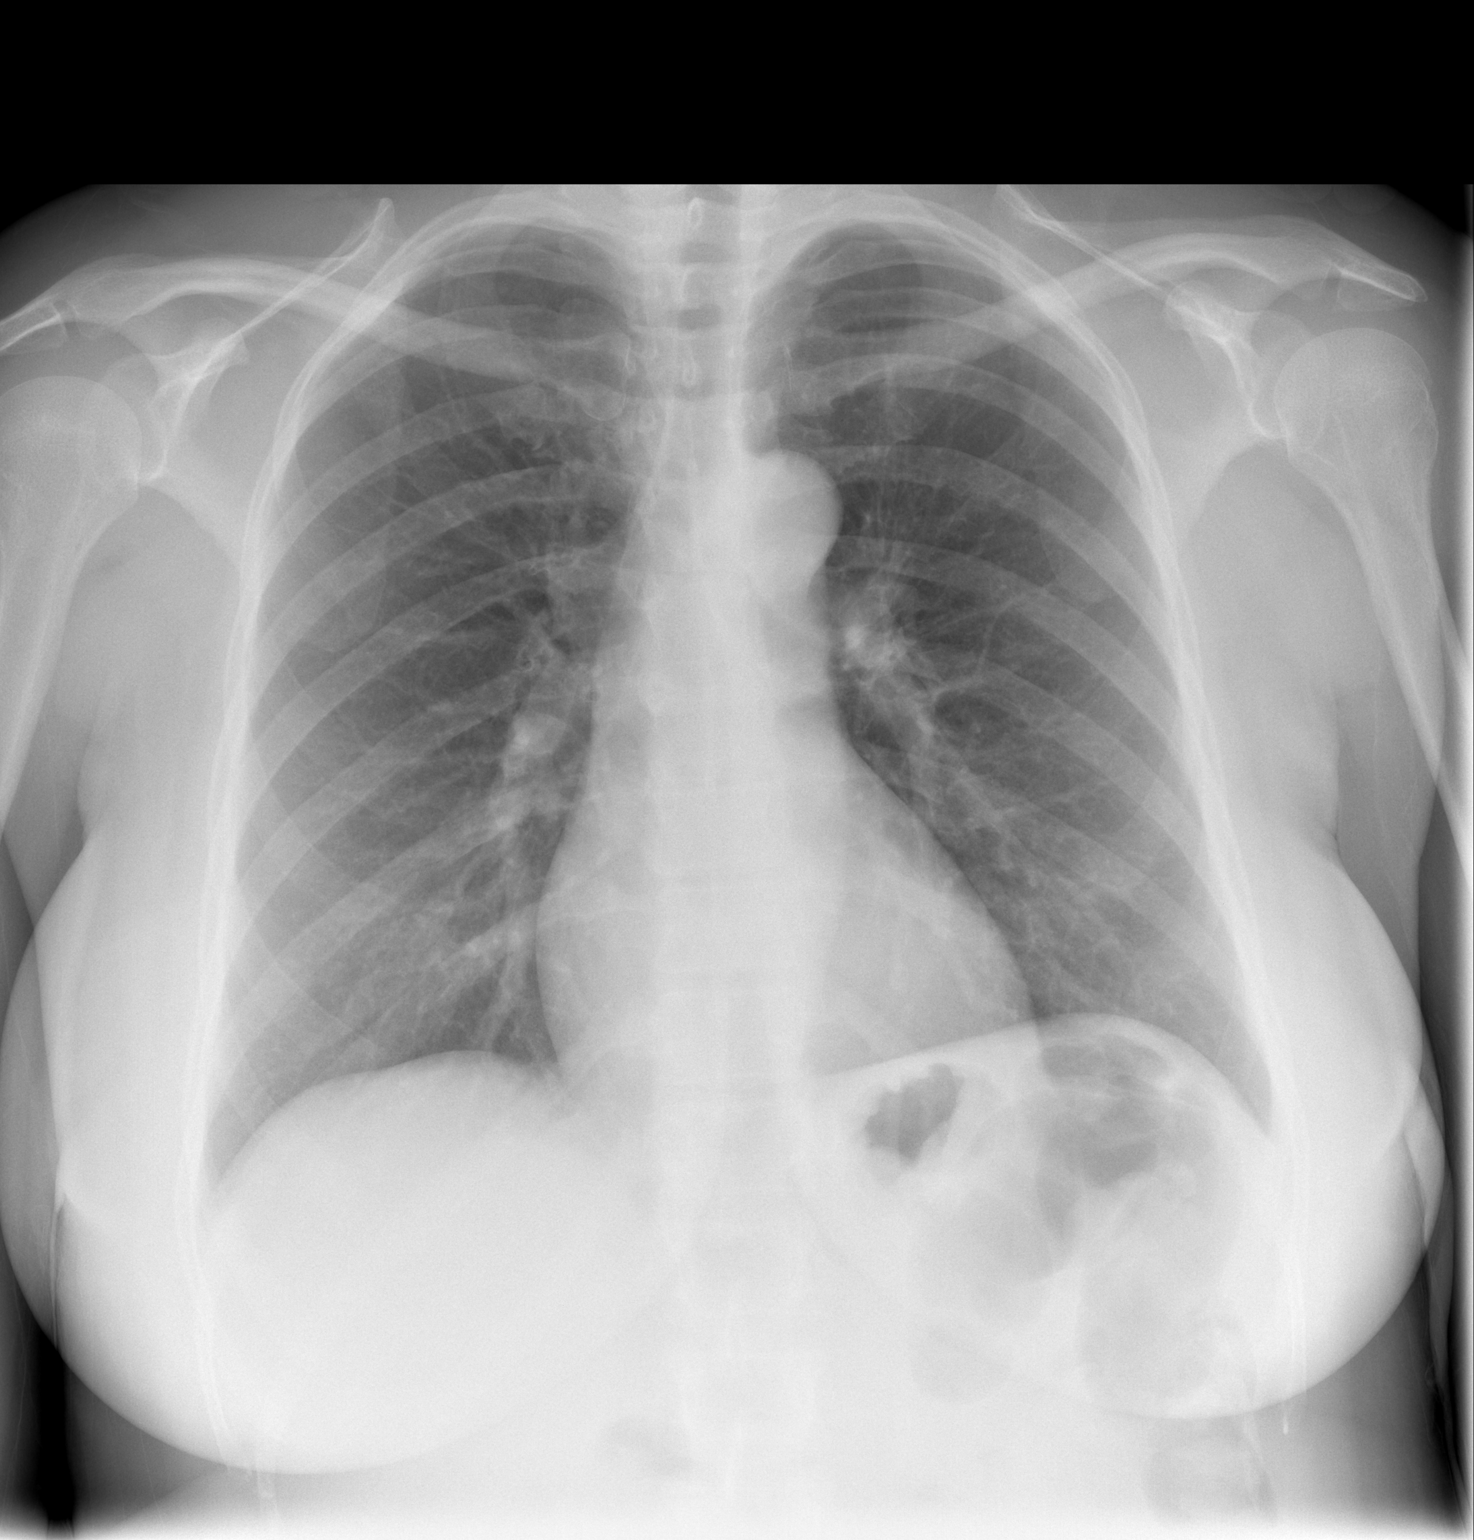

[w chest lat]
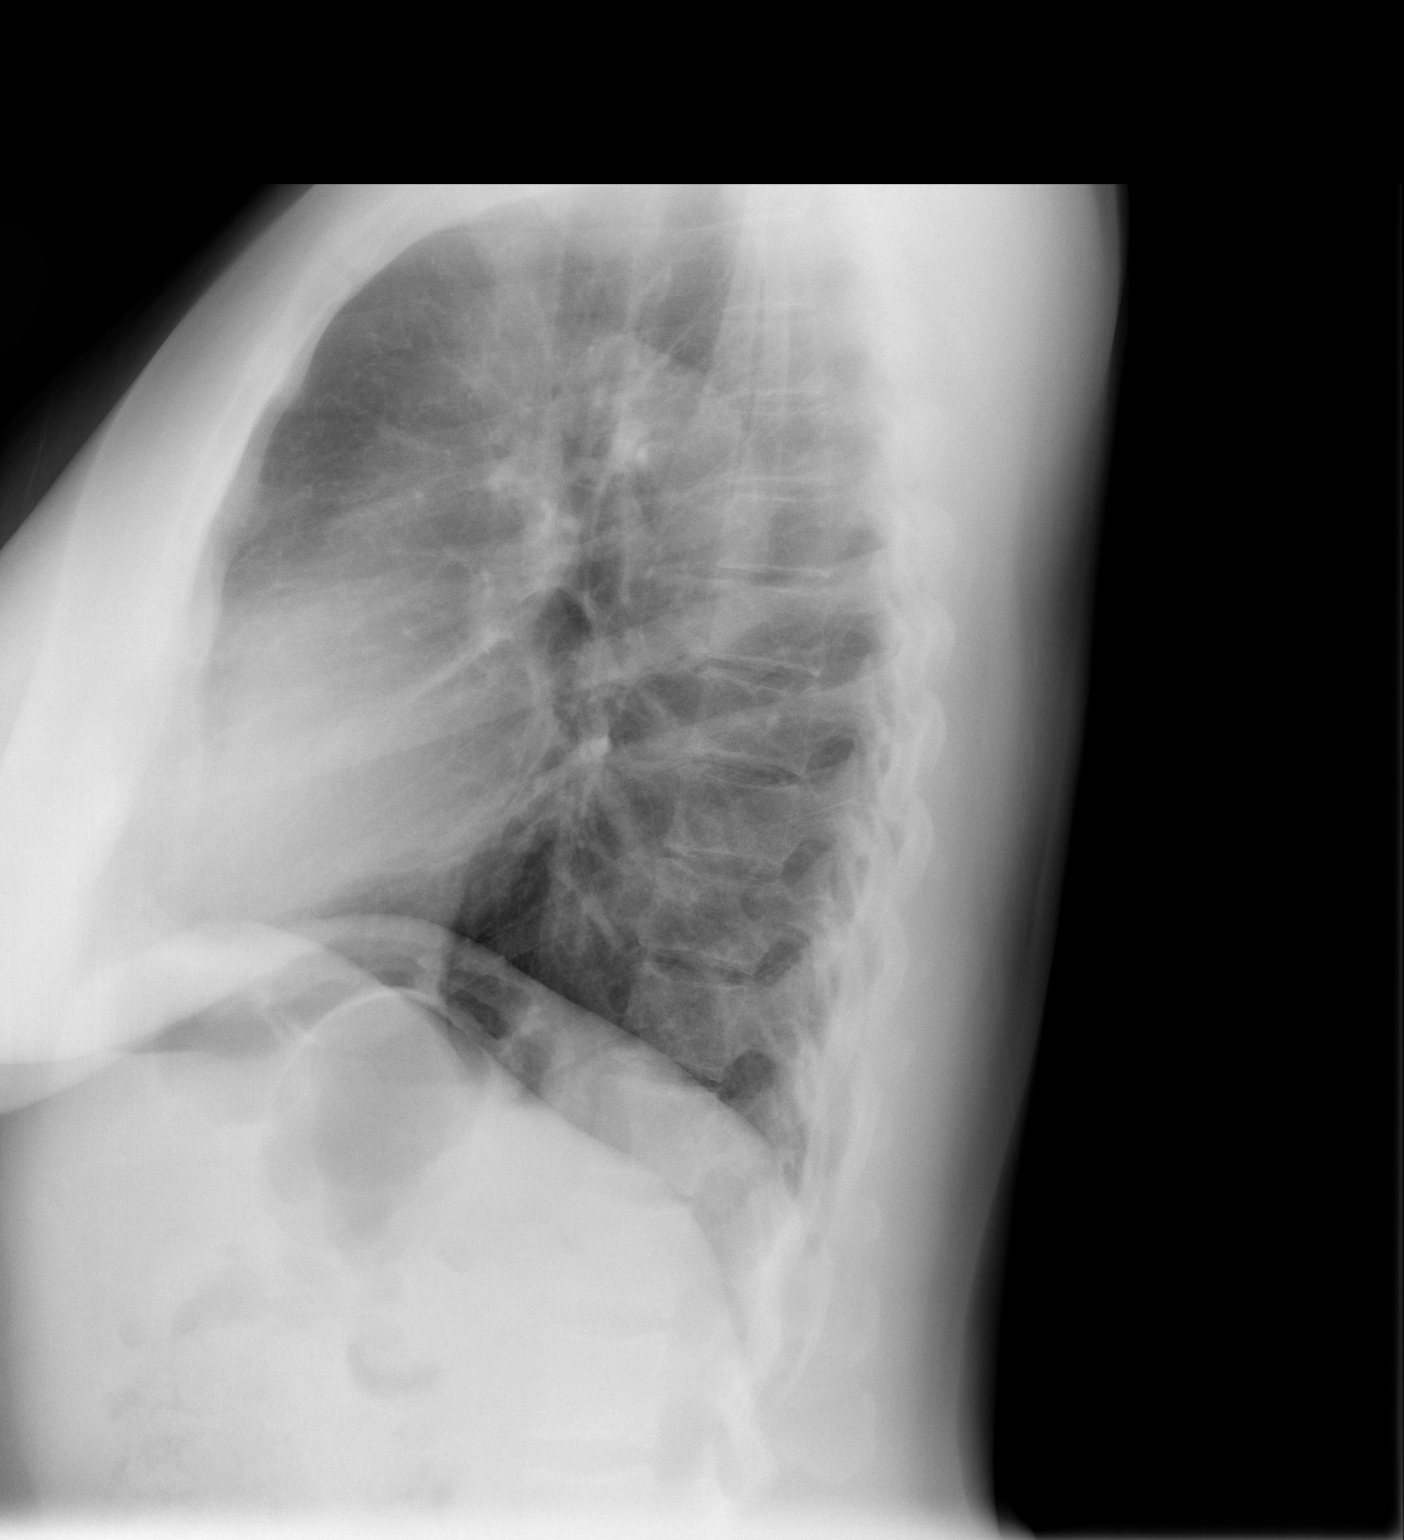

[2 of 2 positions shown; findings below may reference images not displayed]

FINDINGS: No active infiltrate or effusion is seen. There is mild
peribronchial thickening which may indicate bronchitis. Mediastinal
contours are stable. The heart is within normal limits in size. No
bony abnormality is seen.
IMPRESSION: No pneumonia. Mild peribronchial thickening may indicate bronchitis.

## 2014-05-08 ENCOUNTER — Encounter: Payer: Self-pay | Admitting: Internal Medicine

## 2014-05-08 ENCOUNTER — Ambulatory Visit (INDEPENDENT_AMBULATORY_CARE_PROVIDER_SITE_OTHER): Payer: 59 | Admitting: Internal Medicine

## 2014-05-08 VITALS — BP 134/84 | HR 78 | Temp 98.0°F | Wt 196.0 lb

## 2014-05-08 DIAGNOSIS — J069 Acute upper respiratory infection, unspecified: Secondary | ICD-10-CM

## 2014-05-08 MED ORDER — AZITHROMYCIN 250 MG PO TABS: ORAL_TABLET | ORAL | Status: DC

## 2014-05-08 MED ORDER — HYDROCODONE-HOMATROPINE 5-1.5 MG/5ML PO SYRP: 5.0000 mL | ORAL_SOLUTION | Freq: Three times a day (TID) | ORAL | Status: DC | PRN

## 2014-05-09 NOTE — Progress Notes (Signed)
   Subjective:    Patient ID: Erin [name], female    DOB: [birth year], 49 y.o.   MRN: [id number]  HPI  URI with cough and congestion. No fever or shaking chills.      Review of Systems     Objective:   Physical Exam  TMs are clear. Pharynx is clear. Neck is supple. Chest clear to auscultation without rales or wheezing      Assessment & Plan:  Acute URI  Plan: Zithromax Z-PAK take 2 tablets day one followed by 1 tablet days 2 through 5 with 1 refill. If not better in one week Prescription refilled

## 2014-05-10 ENCOUNTER — Ambulatory Visit (INDEPENDENT_AMBULATORY_CARE_PROVIDER_SITE_OTHER): Payer: 59 | Admitting: Family Medicine

## 2014-05-10 ENCOUNTER — Encounter: Payer: Self-pay | Admitting: Internal Medicine

## 2014-05-10 ENCOUNTER — Encounter: Payer: Self-pay | Admitting: Family Medicine

## 2014-05-10 VITALS — BP 134/86 | Ht 69.0 in | Wt 195.0 lb

## 2014-05-10 DIAGNOSIS — M25562 Pain in left knee: Secondary | ICD-10-CM

## 2014-05-10 MED ORDER — METHYLPREDNISOLONE ACETATE 40 MG/ML IJ SUSP
40.0000 mg | Freq: Once | INTRAMUSCULAR | Status: AC
  Administered 2014-05-10: 40 mg via INTRA_ARTICULAR

## 2014-05-10 NOTE — Patient Instructions (Addendum)
Zithromax Z-PAK take 2 tablets one followed by 1 tablet days 2 through 5 with 1 refill

## 2014-05-10 NOTE — Patient Instructions (Signed)
Take tylenol 500mg  1-2 tabs three times a day for pain. Avoid ibuprofen or aleve Avoid Glucosamine sulfate because of your shellfish allergy. Capsaicin topically up to four times a day may also help with pain (or your muscle rub you're using). Cortisone injections are an option - you were given one of these today. If cortisone injections do not help, there are different types of shots that may help but they take longer to take effect. It's important that you continue to stay active. Straight leg raises, knee extensions 3 sets of 10 once a day (add ankle weight if these become too easy). Consider physical therapy to strengthen muscles around the joint that hurts to take pressure off of the joint itself. Shoe inserts with good arch support may be helpful. Walker or cane if needed. Heat or ice 15 minutes at a time 3-4 times a day as needed to help with pain. Water aerobics and cycling with low resistance are the best two types of exercise for arthritis though you can do all activities as tolerated. Follow up with me as needed.

## 2014-05-15 DIAGNOSIS — M25562 Pain in left knee: Secondary | ICD-10-CM | POA: Insufficient documentation

## 2014-05-15 NOTE — Progress Notes (Signed)
PCP: Elby Showers, MD  Subjective:   HPI: Patient is a 49 y.o. female here for left knee pain.  Patient denies known injury. She states pain has slowly come on and worsened since Monday. No catching, locking, giving out. Pain more medial. Has tried icing and a muscle rub. No prior issues.   Past Medical History  Diagnosis Date  . Thyroid disease   . Dysmenorrhea   . Migraine   . Fibrocystic breast disease   . Allergy   . Hemorrhoids   . Asthma     Current Outpatient Prescriptions on File Prior to Visit  Medication Sig Dispense Refill  . ANUCORT-HC 25 MG suppository UNWRAP AND INSERT 1 SUPPOSITORY RECTALLY TWICE DAILY 12 suppository 11  . azithromycin (ZITHROMAX) 250 MG tablet 2 po day 1 then one po days 2-5 6 tablet 1  . butalbital-acetaminophen-caffeine (FIORICET, ESGIC) 50-325-40 MG per tablet TAKE 1 TO 2 TABLETS BY MOUTH EVERY 4 TO 6 HOURS AS NEEDED FOR HEADACHE *NO MORE THAN 6 TABS IN 24 HRS* 60 tablet 5  . Cholecalciferol (VITAMIN D-3 PO) Take 1 tablet by mouth daily.    . diphenhydrAMINE (BENADRYL) 50 MG capsule Take 50 mg by mouth every 6 (six) hours as needed. For sleep    . fluticasone (FLONASE) 50 MCG/ACT nasal spray USE 2 SPRAYS IN EACH NOSTRIL DAILY 48 g PRN  . HYDROcodone-homatropine (HYCODAN) 5-1.5 MG/5ML syrup Take 5 mLs by mouth every 8 (eight) hours as needed for cough. 120 mL 0  . levothyroxine (SYNTHROID, LEVOTHROID) 50 MCG tablet Take 50 mcg by mouth daily before breakfast.    . mupirocin ointment (BACTROBAN) 2 % Apply to affected area 3 times daily 22 g prn  . PROCTOSOL HC 2.5 % rectal cream APPLY RECTALLY 4 TIMES DAILY 28.35 g PRN  . promethazine (PHENERGAN) 25 MG tablet Take 1 tablet (25 mg total) by mouth every 6 (six) hours as needed for nausea or vomiting. 30 tablet 0  . tranexamic acid (LYSTEDA) 650 MG TABS tablet   11  . VENTOLIN HFA 108 (90 BASE) MCG/ACT inhaler INHALE 2 PUFFS BY MOUTH 4 TIMES A DAY 18 g 11   No current facility-administered  medications on file prior to visit.    Past Surgical History  Procedure Laterality Date  . Tubal ligation    . Breast surgery      Allergies  Allergen Reactions  . Aspirin     REACTION: Hives, Wheezing  . Nsaids     REACTION: Hives, Wheezing  . Shellfish Allergy Other (See Comments)    Reaction unknown    History   Social History  . Marital Status: Married    Spouse Name: N/A  . Number of Children: N/A  . Years of Education: N/A   Occupational History  . Not on file.   Social History Main Topics  . Smoking status: Never Smoker   . Smokeless tobacco: Not on file  . Alcohol Use: Yes  . Drug Use: No  . Sexual Activity: Yes    Birth Control/ Protection: Surgical   Other Topics Concern  . Not on file   Social History Narrative    Family History  Problem Relation Age of Onset  . Diabetes Mother   . Hypertension Mother   . Hypertension Father   . Diabetes Father   . Hyperlipidemia Other     BP 134/86 mmHg  Ht 5\' 9"  (1.753 m)  Wt 195 lb (88.451 kg)  BMI 28.78 kg/m2  LMP  04/17/2014  Review of Systems: See HPI above.    Objective:  Physical Exam:  Gen: NAD  Left knee: No gross deformity, ecchymoses, effusion. Mild TTP medial joint line.  No other tenderness. FROM. Negative ant/post drawers. Negative valgus/varus testing. Negative lachmanns. Negative mcmurrays, apleys, patellar apprehension. NV intact distally.    Assessment & Plan:  1. Left knee pain - Consistent with mild DJD.  Discussed tylenol, topical medications.  Cortisone injection given today.  Shown home exercises to do daily.  Heat or ice.  See instructions for further.  F/u prn.  Consider PT, radiographs, MRI if not improving  After informed written consent, patient was seated on exam table. Left knee was prepped with alcohol swab and utilizing anteromedial approach, patient's left knee was injected intraarticularly with 3:1 marcaine: depomedrol. Patient tolerated the procedure well  without immediate complications.

## 2014-05-15 NOTE — Assessment & Plan Note (Signed)
Consistent with mild DJD.  Discussed tylenol, topical medications.  Cortisone injection given today.  Shown home exercises to do daily.  Heat or ice.  See instructions for further.  F/u prn.  Consider PT, radiographs, MRI if not improving  After informed written consent, patient was seated on exam table. Left knee was prepped with alcohol swab and utilizing anteromedial approach, patient's left knee was injected intraarticularly with 3:1 marcaine: depomedrol. Patient tolerated the procedure well without immediate complications.

## 2014-05-16 ENCOUNTER — Other Ambulatory Visit (HOSPITAL_BASED_OUTPATIENT_CLINIC_OR_DEPARTMENT_OTHER): Payer: Self-pay | Admitting: Endocrinology

## 2014-05-16 DIAGNOSIS — E049 Nontoxic goiter, unspecified: Secondary | ICD-10-CM

## 2014-08-08 ENCOUNTER — Ambulatory Visit (HOSPITAL_BASED_OUTPATIENT_CLINIC_OR_DEPARTMENT_OTHER): Payer: 59

## 2014-08-09 ENCOUNTER — Other Ambulatory Visit: Payer: Self-pay | Admitting: Internal Medicine

## 2014-08-09 ENCOUNTER — Ambulatory Visit (HOSPITAL_BASED_OUTPATIENT_CLINIC_OR_DEPARTMENT_OTHER): Payer: 59

## 2014-08-09 ENCOUNTER — Ambulatory Visit (HOSPITAL_BASED_OUTPATIENT_CLINIC_OR_DEPARTMENT_OTHER)
Admission: RE | Admit: 2014-08-09 | Discharge: 2014-08-09 | Disposition: A | Discharge status: Home / Self Care | Payer: 59 | Source: Ambulatory Visit | Attending: Endocrinology | Admitting: Endocrinology

## 2014-08-09 DIAGNOSIS — Z1231 Encounter for screening mammogram for malignant neoplasm of breast: Secondary | ICD-10-CM

## 2014-08-09 DIAGNOSIS — E049 Nontoxic goiter, unspecified: Secondary | ICD-10-CM | POA: Diagnosis not present

## 2014-09-04 ENCOUNTER — Telehealth: Payer: Self-pay | Admitting: Internal Medicine

## 2014-09-04 MED ORDER — EPINEPHRINE 0.3 MG/0.3ML IJ SOAJ: 0.3000 mg | Freq: Once | INTRAMUSCULAR | Status: DC

## 2014-09-04 NOTE — Telephone Encounter (Signed)
Refill Epipen prn one year

## 2014-09-04 NOTE — Telephone Encounter (Signed)
Epipen refilled

## 2014-09-04 NOTE — Telephone Encounter (Signed)
States she believes that she had something to eat from the cafeteria that may have had some form of shrimp in it.  States she is allergic to shrimp.  She broke out into hives and a rash.  States that the hives are now gone and the rash isn't bad.  She loaded up on Benadryl.  She doesn't feel she needs to come and see you.  However, she wants to know if you will refill her Epipen.    Work:  [phone] (today until 4pm) or her cell phone.    Pharmacy:  [facility]

## 2014-12-13 ENCOUNTER — Other Ambulatory Visit: Payer: Self-pay | Admitting: Internal Medicine

## 2014-12-18 ENCOUNTER — Ambulatory Visit (HOSPITAL_BASED_OUTPATIENT_CLINIC_OR_DEPARTMENT_OTHER)
Admission: RE | Admit: 2014-12-18 | Discharge: 2014-12-18 | Disposition: A | Discharge status: Home / Self Care | Payer: 59 | Source: Ambulatory Visit | Attending: Internal Medicine | Admitting: Internal Medicine

## 2014-12-18 DIAGNOSIS — Z1231 Encounter for screening mammogram for malignant neoplasm of breast: Secondary | ICD-10-CM | POA: Insufficient documentation

## 2015-01-03 ENCOUNTER — Other Ambulatory Visit: Payer: 59 | Admitting: Internal Medicine

## 2015-01-03 DIAGNOSIS — Z0000 Encounter for general adult medical examination without abnormal findings: Secondary | ICD-10-CM

## 2015-01-03 LAB — CBC WITH DIFFERENTIAL/PLATELET
BASOS ABS: 0 10*3/uL (ref 0.0–0.1)
BASOS PCT: 1 % (ref 0–1)
Eosinophils Absolute: 0.1 10*3/uL (ref 0.0–0.7)
Eosinophils Relative: 3 % (ref 0–5)
HCT: 36.1 % (ref 36.0–46.0)
Hemoglobin: 11.7 g/dL — ABNORMAL LOW (ref 12.0–15.0)
Lymphocytes Relative: 35 % (ref 12–46)
Lymphs Abs: 1.1 10*3/uL (ref 0.7–4.0)
MCH: 28.7 pg (ref 26.0–34.0)
MCHC: 32.4 g/dL (ref 30.0–36.0)
MCV: 88.5 fL (ref 78.0–100.0)
MPV: 9.4 fL (ref 8.6–12.4)
Monocytes Absolute: 0.4 10*3/uL (ref 0.1–1.0)
Monocytes Relative: 12 % (ref 3–12)
NEUTROS ABS: 1.5 10*3/uL — AB (ref 1.7–7.7)
NEUTROS PCT: 49 % (ref 43–77)
PLATELETS: 184 10*3/uL (ref 150–400)
RBC: 4.08 MIL/uL (ref 3.87–5.11)
RDW: 14 % (ref 11.5–15.5)
WBC: 3 10*3/uL — ABNORMAL LOW (ref 4.0–10.5)

## 2015-01-03 LAB — LIPID PANEL
CHOL/HDL RATIO: 3.4 ratio (ref <=5.0)
Cholesterol: 170 mg/dL (ref 125–200)
HDL: 50 mg/dL (ref >=46)
LDL Cholesterol: 102 mg/dL (ref <130)
Triglycerides: 90 mg/dL (ref <150)
VLDL: 18 mg/dL (ref <30)

## 2015-01-03 LAB — COMPLETE METABOLIC PANEL WITH GFR
ALT: 12 U/L (ref 6–29)
AST: 14 U/L (ref 10–35)
Albumin: 3.9 g/dL (ref 3.6–5.1)
Alkaline Phosphatase: 60 U/L (ref 33–130)
BUN: 12 mg/dL (ref 7–25)
CO2: 27 mmol/L (ref 20–31)
CREATININE: 0.73 mg/dL (ref 0.50–1.05)
Calcium: 8.8 mg/dL (ref 8.6–10.4)
Chloride: 105 mmol/L (ref 98–110)
GFR, Est African American: >89 mL/min (ref >=60)
GFR, Est Non African American: >89 mL/min (ref >=60)
Glucose, Bld: 89 mg/dL (ref 65–99)
Potassium: 3.8 mmol/L (ref 3.5–5.3)
SODIUM: 138 mmol/L (ref 135–146)
TOTAL PROTEIN: 6.5 g/dL (ref 6.1–8.1)
Total Bilirubin: 0.4 mg/dL (ref 0.2–1.2)

## 2015-01-03 LAB — TSH: TSH: 1.09 u[IU]/mL (ref 0.350–4.500)

## 2015-01-04 LAB — VITAMIN D 25 HYDROXY (VIT D DEFICIENCY, FRACTURES): VIT D 25 HYDROXY: 37 ng/mL (ref 30–100)

## 2015-01-08 ENCOUNTER — Ambulatory Visit (INDEPENDENT_AMBULATORY_CARE_PROVIDER_SITE_OTHER): Payer: 59 | Admitting: Internal Medicine

## 2015-01-08 ENCOUNTER — Encounter: Payer: Self-pay | Admitting: Internal Medicine

## 2015-01-08 VITALS — BP 124/84 | HR 77 | Temp 98.0°F | Resp 18 | Ht 68.0 in | Wt 196.0 lb

## 2015-01-08 DIAGNOSIS — Z8669 Personal history of other diseases of the nervous system and sense organs: Secondary | ICD-10-CM | POA: Diagnosis not present

## 2015-01-08 DIAGNOSIS — Z0000 Encounter for general adult medical examination without abnormal findings: Secondary | ICD-10-CM

## 2015-01-08 DIAGNOSIS — E049 Nontoxic goiter, unspecified: Secondary | ICD-10-CM | POA: Diagnosis not present

## 2015-01-08 DIAGNOSIS — J309 Allergic rhinitis, unspecified: Secondary | ICD-10-CM | POA: Diagnosis not present

## 2015-01-08 DIAGNOSIS — K648 Other hemorrhoids: Secondary | ICD-10-CM | POA: Diagnosis not present

## 2015-01-08 DIAGNOSIS — Z62810 Personal history of physical and sexual abuse in childhood: Secondary | ICD-10-CM

## 2015-01-08 LAB — POCT URINALYSIS DIPSTICK
Bilirubin, UA: NEGATIVE
GLUCOSE UA: NEGATIVE
Ketones, UA: NEGATIVE
Leukocytes, UA: NEGATIVE
Nitrite, UA: NEGATIVE
PROTEIN UA: NEGATIVE
RBC UA: NEGATIVE
SPEC GRAV UA: 1.01
UROBILINOGEN UA: NEGATIVE
pH, UA: 7

## 2015-01-08 MED ORDER — ZOLMITRIPTAN 5 MG PO TBDP: 5.0000 mg | ORAL_TABLET | ORAL | Status: DC | PRN

## 2015-02-05 ENCOUNTER — Encounter: Payer: Self-pay | Admitting: Internal Medicine
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
## 2015-02-05 NOTE — Patient Instructions (Addendum)
It was a pleasure to see you today. Migraine medications refilled. Return in one year or as needed. Consider colonoscopy in the near future. Let us know where you would like to be referred.

## 2015-02-26 ENCOUNTER — Ambulatory Visit (INDEPENDENT_AMBULATORY_CARE_PROVIDER_SITE_OTHER): Payer: 59 | Admitting: Podiatry

## 2015-02-26 ENCOUNTER — Encounter: Payer: Self-pay | Admitting: Podiatry

## 2015-02-26 ENCOUNTER — Ambulatory Visit (INDEPENDENT_AMBULATORY_CARE_PROVIDER_SITE_OTHER): Payer: 59

## 2015-02-26 ENCOUNTER — Other Ambulatory Visit: Payer: Self-pay | Admitting: Podiatry

## 2015-02-26 ENCOUNTER — Ambulatory Visit: Payer: 59 | Admitting: Podiatry

## 2015-02-26 VITALS — BP 133/86 | HR 85 | Resp 12

## 2015-02-26 DIAGNOSIS — M2041 Other hammer toe(s) (acquired), right foot: Secondary | ICD-10-CM

## 2015-02-26 DIAGNOSIS — Q6650 Congenital pes planus, unspecified foot: Secondary | ICD-10-CM

## 2015-02-26 DIAGNOSIS — M779 Enthesopathy, unspecified: Secondary | ICD-10-CM | POA: Diagnosis not present

## 2015-02-26 DIAGNOSIS — M2040 Other hammer toe(s) (acquired), unspecified foot: Secondary | ICD-10-CM

## 2015-02-26 DIAGNOSIS — M722 Plantar fascial fibromatosis: Secondary | ICD-10-CM

## 2015-02-26 NOTE — Progress Notes (Signed)
Erin [name] presents today as a new patient complaining of burning pain to the forefoot left greater than the right for several months. She states that recently she performed a home pedicure and utilize a pumice stone to help free a callus from her forefoot. She states that the burning that she had from this callus will away. She states that she would like to figure out a way to prevent this from reoccurring. She also states that she has painful fifth toes both feet. She states they bother her with her shoes on a regular basis and prevent her from wearing the shoes that she would like to wear.  Objective: I have reviewed her past medical history medications allergies surgeries social history. She presents today as a 49 year old black female in no apparent distress and a very good personality. She works for [employer]. Pulses are strongly palpable bilateral neurologic sensorium is intact per Semmes-Weinstein monofilament. Deep tendon reflexes are intact bilateral and muscle strength was 5 over 5 dorsiflexion plantar flexors and inverters and everters all intrinsic musculature is intact. Orthopedic evaluation demonstrates all joints distal to the ankle for range of motion without crepitation. She does have mild flexible pes planus bilateral with mallet toe deformities 23 and 4 bilateral feet. She also has an adductovarus rotated hammertoe deformity which is more rigid fifth digits bilateral. These are nonreducible and painful on palpation. Radiographs confirm pes planus bilateral mallet toe deformities as well as hammertoe deformities fifth digits bilateral. Reactive hyperkeratosis was her also present overlying the fifth toes bilaterally.  Assessment: Pes planus bilateral. Neuritis associated with callus forefoot bilateral. Plantar fasciitis cannot be ruled out. hammertoe deformity fifth bilateral.  Plan: We discussed the etiology pathology conservative versus surgical therapies. At this point she  was scanned for orthotics. We also discussed in great detail today derotational arthroplasties of the fifth digits bilaterally. I explained to her that she would have to be out of work for at least 2 weeks possibly longer for these toes to heal enough for her to get back into her regular shoes and back to her regular job. She states that she will consider this in the near future and appreciates our discussion. I will follow-up with her after her orthotics for surgical consult.

## 2015-02-28 ENCOUNTER — Ambulatory Visit: Payer: 59 | Admitting: Podiatry

## 2015-03-26 ENCOUNTER — Ambulatory Visit: Payer: 59 | Admitting: *Deleted

## 2015-03-26 DIAGNOSIS — M779 Enthesopathy, unspecified: Secondary | ICD-10-CM

## 2015-03-26 NOTE — Patient Instructions (Signed)

## 2015-03-26 NOTE — Progress Notes (Signed)
Patient ID: Erin [name], female   DOB: 1965/09/24, 50 y.o.   MRN: [id number] Patient presents for orthotic pick up.  Verbal and written break in and wear instructions given.  Patient will follow up in 4 weeks if symptoms worsen or fail to improve.

## 2015-04-08 ENCOUNTER — Emergency Department
Admission: EM | Admit: 2015-04-08 | Discharge: 2015-04-08 | Disposition: A | Discharge status: Home / Self Care | Payer: 59 | Source: Home / Self Care | Attending: Emergency Medicine | Admitting: Emergency Medicine

## 2015-04-08 ENCOUNTER — Encounter: Payer: Self-pay | Admitting: *Deleted

## 2015-04-08 DIAGNOSIS — J209 Acute bronchitis, unspecified: Secondary | ICD-10-CM | POA: Diagnosis not present

## 2015-04-08 MED ORDER — AZITHROMYCIN 250 MG PO TABS: ORAL_TABLET | ORAL | Status: DC

## 2015-04-08 MED ORDER — BENZONATATE 200 MG PO CAPS: ORAL_CAPSULE | ORAL | Status: DC

## 2015-04-08 MED ORDER — PREDNISONE 10 MG (21) PO TBPK: ORAL_TABLET | ORAL | Status: DC

## 2015-04-08 NOTE — ED Notes (Signed)
Pt c/o nonproductive cough and nasal congestion x 3 days. She has taken Sudafed and Hycodan. Denies fever.

## 2015-04-08 NOTE — ED Provider Notes (Signed)
CSN: [id number]     Arrival date & time 04/08/15  1831 History   First MD Initiated Contact with Patient 04/08/15 [place]     Chief Complaint  Patient presents with  . Cough   Patient presents to [facility]. 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 HPI URI HISTORY  Rawda is a 50 y.o. female who complains of onset of cold and chest congestion and cough symptoms for several days.  Have been using over-the-counter treatment which helps a little bit. Sudafed has helped the nasal congestion, but may be elevating BP somewhat. No other cardiorespiratory side effects on the Sudafed. Tried Hycodan at night for cough which helps somewhat at night.  No chills/sweats +  Low-grade Fever  +  Nasal congestion +  Discolored Post-nasal drainage No sinus pain/pressure No sore throat  +  Nonproductive cough No wheezing Positive chest congestion No hemoptysis No shortness of breath No pleuritic pain  No itchy/red eyes No earache  No nausea No vomiting No abdominal pain No diarrhea  No skin rashes +  Fatigue No myalgias No headache   Past Medical History  Diagnosis Date  . Fibrocystic breast disease   . Allergy   . Hemorrhoids   . Asthma    Past Surgical History  Procedure Laterality Date  . Tubal ligation    . Breast surgery     Family History  Problem Relation Age of Onset  . Diabetes Mother   . Hypertension Mother   . Hypertension Father   . Diabetes Father   . Hyperlipidemia Other    Social History  Substance Use Topics  . Smoking status: Never Smoker   . Smokeless tobacco: None  . Alcohol Use: Yes   OB History    No data available     Review of Systems  All other systems reviewed and are negative.   Allergies  Aspirin; Nsaids; and Shellfish allergy  Home Medications   Prior to Admission medications   Medication Sig Start Date End Date Taking? Authorizing Provider  ANUCORT-HC 25 MG suppository UNWRAP AND INSERT 1 SUPPOSITORY  RECTALLY TWICE DAILY 01/17/14   Elby Showers, MD  azithromycin (ZITHROMAX Z-PAK) 250 MG tablet Take 2 tablets on day one, then 1 tablet daily on days 2 through 5 04/08/15   Jacqulyn Cane, MD  benzonatate (TESSALON) 200 MG capsule Take 1 every 8 hours as needed for cough. 04/08/15   Jacqulyn Cane, MD  butalbital-acetaminophen-caffeine (FIORICET, ESGIC) 50-325-40 MG per tablet TAKE 1 TO 2 TABLETS BY MOUTH EVERY 4 TO 6 HOURS AS NEEDED FOR HEADACHE *NO MORE THAN 6 TABS IN 24 HRS* 03/05/14   Elby Showers, MD  Cholecalciferol (VITAMIN D-3 PO) Take 1 tablet by mouth daily.    Historical Provider, MD  diphenhydrAMINE (BENADRYL) 50 MG capsule Take 50 mg by mouth every 6 (six) hours as needed. For sleep    Historical Provider, MD  EPINEPHrine 0.3 mg/0.3 mL IJ SOAJ injection Inject 0.3 mLs (0.3 mg total) into the muscle once. 09/04/14   Elby Showers, MD  fluticasone (FLONASE) 50 MCG/ACT nasal spray USE 2 SPRAYS IN [facility] NOSTRIL DAILY 12/19/13   Elby Showers, MD  HYDROcodone-homatropine [facility]) 5-1.5 MG/5ML syrup Take 5 mLs by mouth every 8 (eight) hours as needed for cough. 05/08/14   Elby Showers, MD  levothyroxine (SYNTHROID, LEVOTHROID) 50 MCG tablet Take 50 mcg by mouth daily before breakfast.    Historical Provider, MD  mupirocin ointment Drue Stager)  2 % APPLY TO AFFECTED AREA 3 TIMES DAILY 12/13/14   Elby Showers, MD  predniSONE (STERAPRED UNI-PAK 21 TAB) 10 MG (21) TBPK tablet Take as directed for 6 days.--Take 6 on day 1, 5 on day 2, 4 on day 3, then 3 tablets on day 4, then 2 tablets on day 5, then 1 on day 6. 04/08/15   Jacqulyn Cane, MD  PROCTOSOL HC 2.5 % rectal cream APPLY RECTALLY 4 TIMES DAILY 05/18/13   Elby Showers, MD  promethazine (PHENERGAN) 25 MG tablet Take 1 tablet (25 mg total) by mouth every 6 (six) hours as needed for nausea or vomiting. 03/27/13   Elby Showers, MD  tranexamic acid (LYSTEDA) 650 MG TABS tablet  11/15/13   Historical Provider, MD  VENTOLIN HFA 108 (90 BASE) MCG/ACT inhaler  INHALE 2 PUFFS BY MOUTH 4 TIMES A DAY 12/02/12   Elby Showers, MD  zolmitriptan (ZOMIG-ZMT) 5 MG disintegrating tablet Take 1 tablet (5 mg total) by mouth as needed for migraine. 01/08/15   Elby Showers, MD   Meds Ordered and Administered this Visit  Medications - No data to display  BP 152/99 mmHg  Pulse 80  Temp(Src) 98.1 F (36.7 C) (Oral)  Resp 18  Ht 5\' 8"  (1.727 m)  Wt 195 lb (88.451 kg)  BMI 29.66 kg/m2  SpO2 98%  LMP 02/06/2015 No data found.   Physical Exam  Constitutional: She is oriented to person, place, and time. She appears well-developed and well-nourished. No distress.  HENT:  Head: Normocephalic and atraumatic.  Right Ear: Tympanic membrane, external ear and ear canal normal.  Left Ear: Tympanic membrane, external ear and ear canal normal.  Nose: Mucosal edema and rhinorrhea present. Right sinus exhibits maxillary sinus tenderness. Left sinus exhibits maxillary sinus tenderness.  Mouth/Throat: Oropharynx is clear and moist. No oral lesions. No oropharyngeal exudate.  Eyes: Right eye exhibits no discharge. Left eye exhibits no discharge. No scleral icterus.  Neck: Neck supple.  Cardiovascular: Normal rate, regular rhythm and normal heart sounds.   Pulmonary/Chest: Effort normal. She has no wheezes. She has rhonchi. She has no rales.  No definite wheezes, but scant late expiratory wheezes on forced expiration bilaterally , anterior lung fields. Good air expansion bilaterally. O2 saturation 98% on room air  Lymphadenopathy:    She has no cervical adenopathy.  Neurological: She is alert and oriented to person, place, and time.  Skin: Skin is warm and dry.  Psychiatric: She has a normal mood and affect.  Nursing note and vitals reviewed.  ED Course  Procedures (including critical care time)  Labs Review Labs Reviewed - No data to display  Imaging Review No results found.  MDM   1. Acute bronchitis, unspecified organism    Treatment options discussed,  as well as risks, benefits, alternatives. Patient voiced understanding and agreement with the following plans: New Prescriptions   AZITHROMYCIN (ZITHROMAX Z-PAK) 250 MG TABLET    Take 2 tablets on day one, then 1 tablet daily on days 2 through 5   BENZONATATE (TESSALON) 200 MG CAPSULE    Take 1 every 8 hours as needed for cough.   PREDNISONE (STERAPRED UNI-PAK 21 TAB) 10 MG (21) TBPK TABLET    Take as directed for 6 days.--Take 6 on day 1, 5 on day 2, 4 on day 3, then 3 tablets on day 4, then 2 tablets on day 5, then 1 on day 6.   Other symptomatic care discussed. Follow-up with  your primary care doctor in 5-7 days if not improving, or sooner if symptoms become worse. Precautions discussed. Red flags discussed. Questions invited and answered. Patient voiced understanding and agreement.     Jacqulyn Cane, MD 04/08/15 [place]

## 2015-04-19 MED FILL — LEVOTHYROXINE 50 MCG TABLET: 50 | 90 days supply | Qty: 90 | Fill #1

## 2015-05-06 ENCOUNTER — Encounter: Payer: Self-pay | Admitting: Internal Medicine

## 2015-05-06 ENCOUNTER — Ambulatory Visit (INDEPENDENT_AMBULATORY_CARE_PROVIDER_SITE_OTHER): Payer: 59 | Admitting: Internal Medicine

## 2015-05-06 VITALS — BP 128/86 | HR 103 | Temp 98.0°F | Resp 18 | Wt 195.0 lb

## 2015-05-06 DIAGNOSIS — B349 Viral infection, unspecified: Secondary | ICD-10-CM

## 2015-05-06 DIAGNOSIS — H6501 Acute serous otitis media, right ear: Secondary | ICD-10-CM | POA: Diagnosis not present

## 2015-05-06 MED ORDER — BENZONATATE 200 MG PO CAPS: 200.0000 mg | ORAL_CAPSULE | Freq: Three times a day (TID) | ORAL | Status: DC | PRN

## 2015-05-06 MED ORDER — HYDROCODONE-HOMATROPINE 5-1.5 MG/5ML PO SYRP: 5.0000 mL | ORAL_SOLUTION | Freq: Three times a day (TID) | ORAL | Status: DC | PRN

## 2015-05-06 MED ORDER — LEVOFLOXACIN 500 MG PO TABS: 500.0000 mg | ORAL_TABLET | Freq: Every day | ORAL | Status: DC

## 2015-05-06 MED FILL — HYDROCODONE-HOMATROPINE SYR: 5-1.5 | 8 days supply | Qty: 120 | Fill #0

## 2015-05-06 MED FILL — OSELTAMIVIR PHOS 75 MG CAP: 75 | 5 days supply | Qty: 10 | Fill #0

## 2015-05-06 MED FILL — levoFLOXacin 500 MG TABS: 500 | 7 days supply | Qty: 7 | Fill #0

## 2015-05-06 NOTE — Progress Notes (Signed)
   Subjective:    Patient ID: Erin [name], female    DOB: Jul 24, 1965, 50 y.o.   MRN: [id number]  HPI 50 year old Female with cough, low-grade fever and headache. Initially thought today she had migraine but hasn't felt well. Daughter has had viral syndrome and was seen at urgent care with negative strep and negative flu test. Patient had flu vaccine through employment. Patient has had malaise and fatigue today with some myalgias. No shaking chills. No vomiting or diarrhea.    Review of Systems as above    Objective:   Physical Exam   Skin warm and dry. Pharynx is clear. Right TM is pink and slightly full.left TM okay. Neck is supple. Chest is clear without rales or wheezing. Nodes none.       Assessment & Plan:  Viral syndrome  Plan: I have given her prescription for Tamiflu 75 mg twice daily for 5 days to take if symptoms worsen such as myalgias fever and chills. Refill Hycodan 1 teaspoon by mouth every 8 hours when necessary cough. Refill Tessalon Perles. Levaquin 500 milligrams daily for 7 days. Sometimes has myalgias with Levaquin. Suggested she split dose and take 250 mg twice daily. Suggested she stay out of work tomorrow. Tylenol or Advil for fever. Rest and drink plenty of fluids.

## 2015-05-06 NOTE — Patient Instructions (Signed)
Rest and drink plenty of fluids. Tylenol as needed for fever. Hycodan and Tessalon Perles as needed for cough. Levaquin 500 milligrams daily for 7 days.

## 2015-05-07 ENCOUNTER — Telehealth: Payer: Self-pay | Admitting: Internal Medicine

## 2015-05-07 NOTE — Telephone Encounter (Signed)
Patient notified

## 2015-05-07 NOTE — Telephone Encounter (Signed)
Until feeling better and afebrille for 24 hours ie 2-3 days take the tamiflu.

## 2015-05-07 NOTE — Telephone Encounter (Signed)
She doesn't feel well today; has the body aches and she thinks she has the flu.  She would like to know how long you think she needs to stay out of work.  I advised that we normally advise people that they need to be afebrile for 24 hours.  She said she was probably going to have to go through [employer] and complete the FMLA paperwork because of missing more than 3 days.  She wants to know how long you think she should stay out?  Her shift is Monday through Friday.  She stayed out today.    Please advise.    [phone]

## 2015-05-08 DIAGNOSIS — J209 Acute bronchitis, unspecified: Secondary | ICD-10-CM

## 2015-05-09 ENCOUNTER — Other Ambulatory Visit: Payer: Self-pay | Admitting: Internal Medicine

## 2015-05-09 MED FILL — BUTALB-ACETAMIN-CAFF 50-325: 50-325-40 | 10 days supply | Qty: 60 | Fill #0

## 2015-05-09 NOTE — Telephone Encounter (Signed)
Phoned to pharmacy 

## 2015-05-09 NOTE — Telephone Encounter (Signed)
Refill for 90 days

## 2015-05-10 ENCOUNTER — Encounter: Payer: Self-pay | Admitting: Internal Medicine

## 2015-05-22 ENCOUNTER — Encounter: Payer: Self-pay | Admitting: Gastroenterology

## 2015-06-11 ENCOUNTER — Other Ambulatory Visit: Payer: Self-pay | Admitting: Internal Medicine

## 2015-06-11 MED FILL — ANUCORT-HC 25 MG SUPPOSITOR: 25 | 6 days supply | Qty: 12 | Fill #0

## 2015-06-11 MED FILL — PROCTOSOL-HC 2.5% CREAM: 2.5 | 10 days supply | Qty: 28 | Fill #0

## 2015-06-25 DIAGNOSIS — H524 Presbyopia: Secondary | ICD-10-CM | POA: Diagnosis not present

## 2015-06-25 DIAGNOSIS — H5213 Myopia, bilateral: Secondary | ICD-10-CM | POA: Diagnosis not present

## 2015-06-25 DIAGNOSIS — H52223 Regular astigmatism, bilateral: Secondary | ICD-10-CM | POA: Diagnosis not present

## 2015-07-01 ENCOUNTER — Other Ambulatory Visit: Payer: Self-pay | Admitting: Internal Medicine

## 2015-07-01 MED FILL — FLUTICASONE PROP 50 MCG SPR: 50 | 90 days supply | Qty: 48 | Fill #0

## 2015-07-09 ENCOUNTER — Telehealth: Payer: Self-pay | Admitting: *Deleted

## 2015-07-09 NOTE — Telephone Encounter (Signed)
Spoke with patient.  07/09/15 PV rescheduled for 07/23/15 at 430 pm in [facility].  No show letter printed but not mailed.

## 2015-07-17 MED FILL — LEVOTHYROXINE 50 MCG TABLET: 50 | 90 days supply | Qty: 90 | Fill #2

## 2015-07-23 ENCOUNTER — Ambulatory Visit (AMBULATORY_SURGERY_CENTER): Payer: Self-pay

## 2015-07-23 VITALS — Ht 68.0 in | Wt 193.4 lb

## 2015-07-23 DIAGNOSIS — Z1211 Encounter for screening for malignant neoplasm of colon: Secondary | ICD-10-CM

## 2015-07-23 NOTE — Progress Notes (Signed)
Per pt, no allergies to soy or egg products.Pt not taking any weight loss meds or using  O2 at home. 

## 2015-07-26 ENCOUNTER — Ambulatory Visit (AMBULATORY_SURGERY_CENTER): Payer: 59 | Admitting: Gastroenterology

## 2015-07-26 ENCOUNTER — Encounter: Payer: Self-pay | Admitting: Gastroenterology

## 2015-07-26 VITALS — BP 122/82 | HR 96 | Temp 99.1°F | Resp 10 | Ht 68.0 in | Wt 193.0 lb

## 2015-07-26 DIAGNOSIS — Z1211 Encounter for screening for malignant neoplasm of colon: Secondary | ICD-10-CM | POA: Diagnosis not present

## 2015-07-26 DIAGNOSIS — E039 Hypothyroidism, unspecified: Secondary | ICD-10-CM | POA: Diagnosis not present

## 2015-07-26 DIAGNOSIS — E669 Obesity, unspecified: Secondary | ICD-10-CM | POA: Diagnosis not present

## 2015-07-26 MED ORDER — SODIUM CHLORIDE 0.9 % IV SOLN: 500.0000 mL | INTRAVENOUS | Status: DC

## 2015-07-26 NOTE — Op Note (Signed)
[name] Patient Name: Erin [name] Procedure Date: 07/26/2015 9:41 AM MRN: [id number] Endoscopist: Ladene Artist , MD Age: 50 Referring MD:  Date of Birth: 05-Apr-1965 Gender: Female Procedure:                Colonoscopy Indications:              Screening for colorectal malignant neoplasm Medicines:                Monitored Anesthesia Care Procedure:                Pre-Anesthesia Assessment:                           - Prior to the procedure, a History and Physical                            was performed, and patient medications and                            allergies were reviewed. The patient's tolerance of                            previous anesthesia was also reviewed. The risks                            and benefits of the procedure and the sedation                            options and risks were discussed with the patient.                            All questions were answered, and informed consent                            was obtained. Prior Anticoagulants: The patient has                            taken no previous anticoagulant or antiplatelet                            agents. ASA Grade Assessment: II - A patient with                            mild systemic disease. After reviewing the risks                            and benefits, the patient was deemed in                            satisfactory condition to undergo the procedure.                           After obtaining informed consent, the colonoscope  was passed under direct vision. Throughout the                            procedure, the patient's blood pressure, pulse, and                            oxygen saturations were monitored continuously. The                            Model PCF-[id number]) scope was introduced                            through the anus and advanced to the the cecum,                            identified by appendiceal orifice and  ileocecal                            valve. The ileocecal valve, appendiceal orifice,                            and rectum were photographed. The quality of the                            bowel preparation was excellent. The colonoscopy                            was performed without difficulty. The patient                            tolerated the procedure well. Scope In: 9:47:48 AM Scope Out: 9:59:51 AM Scope Withdrawal Time: 0 hours 9 minutes 26 seconds  Total Procedure Duration: 0 hours 12 minutes 3 seconds  Findings:                 Internal hemorrhoids were found during                            retroflexion. The hemorrhoids were small and Grade                            I (internal hemorrhoids that do not prolapse).                           The entire examined colon otherwise appeared normal. Complications:            No immediate complications. Estimated blood loss:                            None. Estimated Blood Loss:     Estimated blood loss: none. Impression:               - Internal hemorrhoids.                           - The entire examined colon is  otherwise normal. Recommendation:           - Repeat colonoscopy in 10 years for screening                            purposes.                           - Patient has a contact number available for                            emergencies. The signs and symptoms of potential                            delayed complications were discussed with the                            patient. Return to normal activities tomorrow.                            Written discharge instructions were provided to the                            patient.                           - Resume previous diet.                           - Continue present medications. Ladene Artist, MD 07/26/2015 10:02:51 AM This report has been signed electronically.

## 2015-07-26 NOTE — Progress Notes (Signed)
Report to PACU, RN, vss, BBS= Clear.  

## 2015-07-26 NOTE — Patient Instructions (Signed)
YOU HAD AN ENDOSCOPIC PROCEDURE TODAY AT [facility]:   Refer to the procedure report that was given to you for any specific questions about what was found during the examination.  If the procedure report does not answer your questions, please call your gastroenterologist to clarify.  If you requested that your care partner not be given the details of your procedure findings, then the procedure report has been included in a sealed envelope for you to review at your convenience later.  YOU SHOULD EXPECT: Some feelings of bloating in the abdomen. Passage of more gas than usual.  Walking can help get rid of the air that was put into your GI tract during the procedure and reduce the bloating. If you had a lower endoscopy (such as a colonoscopy or flexible sigmoidoscopy) you may notice spotting of blood in your stool or on the toilet paper. If you underwent a bowel prep for your procedure, you may not have a normal bowel movement for a few days.  Please Note:  You might notice some irritation and congestion in your nose or some drainage.  This is from the oxygen used during your procedure.  There is no need for concern and it should clear up in a day or so.  SYMPTOMS TO REPORT IMMEDIATELY:   Following lower endoscopy (colonoscopy or flexible sigmoidoscopy):  Excessive amounts of blood in the stool  Significant tenderness or worsening of abdominal pains  Swelling of the abdomen that is new, acute  Fever of 100F or higher  For urgent or emergent issues, a gastroenterologist can be reached at any hour by calling [phone].  DIET: Your first meal following the procedure should be a small meal and then it is ok to progress to your normal diet. Heavy or fried foods are harder to digest and may make you feel nauseous or bloated.  Likewise, meals heavy in dairy and vegetables can increase bloating.  Drink plenty of fluids but you should avoid alcoholic beverages for 24 hours.  ACTIVITY:   You should plan to take it easy for the rest of today and you should NOT DRIVE or use heavy machinery until tomorrow (because of the sedation medicines used during the test).    FOLLOW UP: Our staff will call the number listed on your records the next business day following your procedure to check on you and address any questions or concerns that you may have regarding the information given to you following your procedure. If we do not reach you, we will leave a message.  However, if you are feeling well and you are not experiencing any problems, there is no need to return our call.  We will assume that you have returned to your regular daily activities without incident.  SIGNATURES/CONFIDENTIALITY: You and/or your care partner have signed paperwork which will be entered into your electronic medical record.  These signatures attest to the fact that that the information above on your After Visit Summary has been reviewed and is understood.  Full responsibility of the confidentiality of this discharge information lies with you and/or your care-partner.  Continue your normal medications  Please read over handouts about hemorrhoids and high fiber diets  Next colonoscopy- 10 years

## 2015-07-29 ENCOUNTER — Telehealth: Payer: Self-pay

## 2015-07-29 NOTE — Telephone Encounter (Signed)
  Follow up Call-  Call back number 07/26/2015  Post procedure Call Back phone  # [phone]  Permission to leave phone message Yes     Patient was called for follow up after her procedure on 07/26/2015. The number given for follow up phone call was her work number and she had not arrived yet. No message was left for the patient.

## 2015-09-13 ENCOUNTER — Other Ambulatory Visit: Payer: Self-pay | Admitting: Internal Medicine

## 2015-09-13 DIAGNOSIS — Z1231 Encounter for screening mammogram for malignant neoplasm of breast: Secondary | ICD-10-CM

## 2015-10-21 MED FILL — LEVOTHYROXINE 50 MCG TABLET: 50 | 90 days supply | Qty: 90 | Fill #3

## 2015-11-21 DIAGNOSIS — Z3040 Encounter for surveillance of contraceptives, unspecified: Secondary | ICD-10-CM | POA: Diagnosis not present

## 2015-11-21 DIAGNOSIS — Z6829 Body mass index (BMI) 29.0-29.9, adult: Secondary | ICD-10-CM | POA: Diagnosis not present

## 2015-11-21 DIAGNOSIS — N951 Menopausal and female climacteric states: Secondary | ICD-10-CM | POA: Diagnosis not present

## 2015-11-21 DIAGNOSIS — Z01419 Encounter for gynecological examination (general) (routine) without abnormal findings: Secondary | ICD-10-CM | POA: Diagnosis not present

## 2015-11-21 MED FILL — ESTRACE 0.01% CREAM: 0.1 | 90 days supply | Qty: 43 | Fill #0

## 2015-11-24 ENCOUNTER — Encounter: Payer: Self-pay | Admitting: Emergency Medicine

## 2015-11-24 ENCOUNTER — Emergency Department
Admission: EM | Admit: 2015-11-24 | Discharge: 2015-11-24 | Disposition: A | Discharge status: Home / Self Care | Payer: 59 | Source: Home / Self Care | Attending: Family Medicine | Admitting: Family Medicine

## 2015-11-24 DIAGNOSIS — J069 Acute upper respiratory infection, unspecified: Secondary | ICD-10-CM

## 2015-11-24 DIAGNOSIS — T783XXA Angioneurotic edema, initial encounter: Secondary | ICD-10-CM

## 2015-11-24 LAB — POCT RAPID STREP A (OFFICE): RAPID STREP A SCREEN: NEGATIVE

## 2015-11-24 MED ORDER — PREDNISONE 20 MG PO TABS: 20.0000 mg | ORAL_TABLET | Freq: Two times a day (BID) | ORAL | 0 refills | Status: DC

## 2015-11-24 NOTE — Discharge Instructions (Signed)
Take plain guaifenesin (1200mg  extended release tabs such as Mucinex) twice daily, with plenty of water, for cough and congestion.    Try warm salt water gargles for sore throat.  Continue antihistamine for swelling.

## 2015-11-24 NOTE — ED Provider Notes (Signed)
[name]    CSN: [id number] Arrival date & time: 11/24/15  1226  First Provider Contact:  First MD Initiated Contact with Patient 11/24/15 1318        History   Chief Complaint Chief Complaint  Patient presents with  . Eye Problem    HPI Erin [name] is a 50 y.o. female.   Patient noticed some mild swelling in her fingers yesterday without pain.  This morning she awoke with mild swelling in her right upper eyelid and upper lip.  No swelling of tongue or throat.  No difficulty swallowing.  No chest pain, wheezing, or shortness of breath.  No rash.  She states that she has had a mild "head cold" for about 6 days, with a cough during the past two days.  No fevers, chills, and sweats.      Past Medical History:  Diagnosis Date  . Allergy   . Anemia    no meds  . Asthma    uses inhaler prn  . Fibrocystic breast disease   . Hemorrhoids   . Thyroid disease     Patient Active Problem List   Diagnosis Date Noted  . Left knee pain 05/15/2014  . Left shoulder pain 09/19/2013  . Right knee pain 09/28/2012  . Migraine headache 01/11/2011  . Hemorrhoids 01/11/2011  . Fibrocystic breast disease 01/11/2011  . Goiter 01/11/2011  . ALLERGIC RHINITIS 04/20/2009  . PELVIC SPRAIN AND STRAINS 04/20/2009    Past Surgical History:  Procedure Laterality Date  . BREAST SURGERY     fibrocystic breast/Bil  . TUBAL LIGATION    . WISDOM TOOTH EXTRACTION      OB History    No data available       Home Medications    Prior to Admission medications   Medication Sig Start Date End Date Taking? Authorizing Provider  ANUCORT-HC 25 MG suppository UNWRAP AND INSERT 1 SUPPOSITORY RECTALLY TWICE DAILY Patient taking differently: UNWRAP AND INSERT 1 SUPPOSITORY RECTALLY prn 06/11/15   Elby Showers, MD  benzonatate (TESSALON) 200 MG capsule Take 1 capsule (200 mg total) by mouth 3 (three) times daily as needed for cough. Patient not taking: Reported on 07/23/2015  05/06/15   Elby Showers, MD  butalbital-acetaminophen-caffeine (FIORICET, ESGIC) 50-325-40 MG tablet TAKE 1 TO 2 TABLETS BY MOUTH EVERY 4 TO 6 HOURS AS NEEDED FOR HEADACHE *NO MORE THAN 6 TABS IN 24 HRS* 05/09/15   Elby Showers, MD  Cholecalciferol (VITAMIN D-3 PO) Take 1 tablet by mouth. Vit d 3 1000 units gummies-Take 2 daily    Historical Provider, MD  diphenhydrAMINE (BENADRYL) 50 MG capsule Take 50 mg by mouth every 6 (six) hours as needed. For sleep    Historical Provider, MD  EPINEPHrine 0.3 mg/0.3 mL IJ SOAJ injection Inject 0.3 mLs (0.3 mg total) into the muscle once. Patient not taking: Reported on 07/23/2015 09/04/14   Elby Showers, MD  fluticasone [facility]) 50 MCG/ACT nasal spray USE 2 SPRAYS IN [facility] NOSTRIL DAILY 07/01/15   Elby Showers, MD  HYDROcodone-homatropine [facility]) 5-1.5 MG/5ML syrup Take 5 mLs by mouth every 8 (eight) hours as needed for cough. Patient not taking: Reported on 07/23/2015 05/06/15   Elby Showers, MD  levothyroxine (SYNTHROID, LEVOTHROID) 50 MCG tablet Take 50 mcg by mouth daily before breakfast.    Historical Provider, MD  mupirocin ointment (BACTROBAN) 2 % APPLY TO AFFECTED AREA 3 TIMES DAILY Patient not taking: Reported on 07/23/2015 12/13/14  Elby Showers, MD  predniSONE (DELTASONE) 20 MG tablet Take 1 tablet (20 mg total) by mouth 2 (two) times daily. Take with food. 11/24/15   Kandra Nicolas, MD  PROCTOSOL HC 2.5 % rectal cream APPLY RECTALLY 4 TIMES DAILY Patient taking differently: APPLY RECTALLY 4 TIMES PRN 06/11/15   Elby Showers, MD  promethazine (PHENERGAN) 25 MG tablet Take 1 tablet (25 mg total) by mouth every 6 (six) hours as needed for nausea or vomiting. 03/27/13   Elby Showers, MD  VENTOLIN HFA 108 (90 BASE) MCG/ACT inhaler INHALE 2 PUFFS BY MOUTH 4 TIMES A DAY Patient not taking: Reported on 07/23/2015 12/02/12   Elby Showers, MD  zolmitriptan (ZOMIG-ZMT) 5 MG disintegrating tablet Take 1 tablet (5 mg total) by mouth as needed for migraine.  01/08/15   Elby Showers, MD    Family History Family History  Problem Relation Age of Onset  . Diabetes Mother   . Hypertension Mother   . Hypertension Father   . Diabetes Father   . Prostate cancer Maternal Uncle   . Hyperlipidemia Other     Social History Social History  Substance Use Topics  . Smoking status: Never Smoker  . Smokeless tobacco: Never Used  . Alcohol use No     Allergies   Aspirin; Nsaids; and Shellfish allergy   Review of Systems Review of Systems No sore throat + cough No pleuritic pain No wheezing + nasal congestion + post-nasal drainage No sinus pain/pressure No itchy/red eyes, but swelling right upper eyelid No earache No hemoptysis No SOB No fever/chills No nausea No vomiting No abdominal pain No diarrhea No urinary symptoms No skin rash No fatigue No myalgias No headache Used OTC meds without relief   Physical Exam Triage Vital Signs ED Triage Vitals  Enc Vitals Group     BP 11/24/15 1300 136/87     Pulse Rate 11/24/15 1300 74     Resp 11/24/15 1300 16     Temp 11/24/15 1300 98.2 F (36.8 C)     Temp Source 11/24/15 1300 Oral     SpO2 11/24/15 1300 99 %     Weight 11/24/15 1301 197 lb (89.4 kg)     Height 11/24/15 1301 5\' 9"  (1.753 m)     Head Circumference --      Peak Flow --      Pain Score 11/24/15 1309 0     Pain Loc --      Pain Edu? --      Excl. in [place]? --    No data found.   Updated Vital Signs BP 136/87 (BP Location: Left Arm)   Pulse 74   Temp 98.2 F (36.8 C) (Oral)   Resp 16   Ht 5\' 9"  (1.753 m)   Wt 197 lb (89.4 kg)   LMP 11/04/2015 (Exact Date)   SpO2 99%   BMI 29.09 kg/m   Visual Acuity Right Eye Distance:   Left Eye Distance:   Bilateral Distance:    Right Eye Near:   Left Eye Near:    Bilateral Near:     Physical Exam Nursing notes and Vital Signs reviewed. Appearance:  Patient appears stated age, and in no acute distress Eyes:  Pupils are equal, round, and reactive to light  and accomodation.  Extraocular movement is intact.  Conjunctivae are not inflamed.  Right upper eyelid slightly swollen but without erythema or tenderness.  Ears:  Canals normal.  Tympanic membranes normal.  Nose:  Mildly congested turbinates.  No sinus tenderness.   Mouth:  Upper lip slightly swollen but without erythema or tenderness Pharynx:  Normal Neck:  Supple.  No adenopathy Lungs:  Clear to auscultation.  Breath sounds are equal.  Moving air well. Heart:  Regular rate and rhythm without murmurs, rubs, or gallops.  Abdomen:  Nontender without masses or hepatosplenomegaly.  Bowel sounds are present.  No CVA or flank tenderness.  Extremities:  No edema.  Skin:  No rash present.    UC Treatments / Results  Labs (all labs ordered are listed, but only abnormal results are displayed) Labs Reviewed  POCT RAPID STREP A (OFFICE) negative.    EKG  EKG Interpretation None       Radiology No results found.  Procedures Procedures (including critical care time)  Medications Ordered in UC Medications - No data to display   Initial Impression / Assessment and Plan / UC Course  I have reviewed the triage vital signs and the nursing notes.  Pertinent labs & imaging results that were available during my care of the patient were reviewed by me and considered in my medical decision making (see chart for details).  Clinical Course  Begin prednisone burst for 3 days. Take plain guaifenesin (1200mg  extended release tabs such as Mucinex) twice daily, with plenty of water, for cough and congestion.    Try warm salt water gargles for sore throat.  Continue antihistamine for swelling. Followup with Family Doctor if not improved in 3 to 4 days.     Final Clinical Impressions(s) / UC Diagnoses   Final diagnoses:  Angioedema of lips, initial encounter  Viral URI    New Prescriptions New Prescriptions   PREDNISONE (DELTASONE) 20 MG TABLET    Take 1 tablet (20 mg total) by mouth 2  (two) times daily. Take with food.     Kandra Nicolas, MD 12/04/15 [phone]

## 2015-11-24 NOTE — ED Triage Notes (Signed)
Patient has had URI for about a week; taking OTCs. Yesterday she noticed some finger edema bilaterally. This morning she noticed right eyelid edema. No dyspnea, no rash, no itching.

## 2015-11-28 ENCOUNTER — Telehealth: Payer: Self-pay | Admitting: Emergency Medicine

## 2015-11-28 NOTE — Telephone Encounter (Signed)
Patient states she is doing much better.pak

## 2015-11-30 ENCOUNTER — Encounter: Payer: Self-pay | Admitting: Internal Medicine

## 2015-11-30 ENCOUNTER — Other Ambulatory Visit: Payer: Self-pay | Admitting: Internal Medicine

## 2015-12-12 MED FILL — ANUCORT-HC 25 MG SUPPOSITOR: 25 | 6 days supply | Qty: 12 | Fill #1

## 2015-12-18 ENCOUNTER — Other Ambulatory Visit: Payer: Self-pay | Admitting: Internal Medicine

## 2015-12-18 ENCOUNTER — Telehealth: Payer: Self-pay | Admitting: Internal Medicine

## 2015-12-18 MED FILL — MUPIROCIN 2% OINTMENT: 2 | 10 days supply | Qty: 22 | Fill #0

## 2015-12-18 NOTE — Telephone Encounter (Signed)
Verbal order by Dr. Renold Genta; ok to refill Bactroban 2%.  Disp 22g, 1 refill.    Verbal order for Tessalon Perles 200mg .  Take 1 by mouth three times daily as needed for cough.  Disp #60, 0 refill.  To [facility] to e-scribe.

## 2015-12-18 NOTE — Telephone Encounter (Signed)
Spoke with Dr. Renold Genta and took verbal order for Tessalon Perles 200mg .  Take 1 by mouth three times day as needed for cough.  Dispense #60, no refill.    Spoke with patient and advised that this would be sent to her pharmacy.  Patient is grateful for this.  Patient will call back if she is not better by next week.

## 2015-12-18 NOTE — Telephone Encounter (Signed)
Took Prednisone x 3 days.  Has persistent cough.  She has been using Hycodan at night prn.  No fever, not productive.  She said it may be allergies.  Was seen in Urgent Care but the cough is just hanging on.  Wants to know if you will call in Ladona Ridgel for her?  She has used them in the past and they have been helpful.  Today she has been using Dayquil.  Wanted to make an appointment for Friday to come in and see you, but we are pretty booked; advised that I would take a message and ask you first.    Please advise.

## 2015-12-20 DIAGNOSIS — E049 Nontoxic goiter, unspecified: Secondary | ICD-10-CM | POA: Diagnosis not present

## 2015-12-20 MED FILL — BENZONATATE 200 MG CAPSULE: 200 | 20 days supply | Qty: 60 | Fill #0

## 2015-12-24 ENCOUNTER — Ambulatory Visit (HOSPITAL_BASED_OUTPATIENT_CLINIC_OR_DEPARTMENT_OTHER)
Admission: RE | Admit: 2015-12-24 | Discharge: 2015-12-24 | Disposition: A | Discharge status: Home / Self Care | Payer: 59 | Source: Ambulatory Visit | Attending: Internal Medicine | Admitting: Internal Medicine

## 2015-12-24 DIAGNOSIS — Z1231 Encounter for screening mammogram for malignant neoplasm of breast: Secondary | ICD-10-CM | POA: Diagnosis not present

## 2015-12-26 DIAGNOSIS — E049 Nontoxic goiter, unspecified: Secondary | ICD-10-CM | POA: Diagnosis not present

## 2016-01-09 ENCOUNTER — Other Ambulatory Visit: Payer: Self-pay | Admitting: Internal Medicine

## 2016-01-09 ENCOUNTER — Other Ambulatory Visit: Payer: 59 | Admitting: Internal Medicine

## 2016-01-09 DIAGNOSIS — Z0000 Encounter for general adult medical examination without abnormal findings: Secondary | ICD-10-CM

## 2016-01-09 DIAGNOSIS — D72819 Decreased white blood cell count, unspecified: Secondary | ICD-10-CM | POA: Diagnosis not present

## 2016-01-09 LAB — CBC WITH DIFFERENTIAL/PLATELET
BASOS ABS: 0 {cells}/uL (ref 0–200)
Basophils Relative: 0 %
EOS PCT: 4 %
Eosinophils Absolute: 116 cells/uL (ref 15–500)
HCT: 38.9 % (ref 35.0–45.0)
Hemoglobin: 12.7 g/dL (ref 11.7–15.5)
LYMPHS PCT: 38 %
Lymphs Abs: 1102 cells/uL (ref 850–3900)
MCH: 28.5 pg (ref 27.0–33.0)
MCHC: 32.6 g/dL (ref 32.0–36.0)
MCV: 87.2 fL (ref 80.0–100.0)
MONOS PCT: 12 %
MPV: 9.8 fL (ref 7.5–12.5)
Monocytes Absolute: 348 cells/uL (ref 200–950)
NEUTROS ABS: 1334 {cells}/uL — AB (ref 1500–7800)
NEUTROS PCT: 46 %
PLATELETS: 177 10*3/uL (ref 140–400)
RBC: 4.46 MIL/uL (ref 3.80–5.10)
RDW: 14.1 % (ref 11.0–15.0)
WBC: 2.9 10*3/uL — ABNORMAL LOW (ref 3.8–10.8)

## 2016-01-09 LAB — LIPID PANEL
CHOLESTEROL: 193 mg/dL (ref 125–200)
HDL: 53 mg/dL (ref >=46)
LDL CALC: 122 mg/dL (ref <130)
TRIGLYCERIDES: 91 mg/dL (ref <150)
Total CHOL/HDL Ratio: 3.6 Ratio (ref <=5.0)
VLDL: 18 mg/dL (ref <30)

## 2016-01-09 LAB — COMPREHENSIVE METABOLIC PANEL
ALT: 12 U/L (ref 6–29)
AST: 14 U/L (ref 10–35)
Albumin: 4.1 g/dL (ref 3.6–5.1)
Alkaline Phosphatase: 67 U/L (ref 33–130)
BUN: 11 mg/dL (ref 7–25)
CHLORIDE: 106 mmol/L (ref 98–110)
CO2: 28 mmol/L (ref 20–31)
CREATININE: 0.75 mg/dL (ref 0.50–1.05)
Calcium: 9.2 mg/dL (ref 8.6–10.4)
Glucose, Bld: 86 mg/dL (ref 65–99)
POTASSIUM: 4 mmol/L (ref 3.5–5.3)
SODIUM: 142 mmol/L (ref 135–146)
Total Bilirubin: 0.5 mg/dL (ref 0.2–1.2)
Total Protein: 7 g/dL (ref 6.1–8.1)

## 2016-01-09 LAB — LDL CHOLESTEROL, DIRECT: Direct LDL: 126 mg/dL (ref <130)

## 2016-01-09 LAB — TSH: TSH: 0.93 m[IU]/L

## 2016-01-10 LAB — VITAMIN D 25 HYDROXY (VIT D DEFICIENCY, FRACTURES): Vit D, 25-Hydroxy: 39 ng/mL (ref 30–100)

## 2016-01-14 ENCOUNTER — Encounter: Payer: Self-pay | Admitting: Internal Medicine

## 2016-01-14 ENCOUNTER — Ambulatory Visit (INDEPENDENT_AMBULATORY_CARE_PROVIDER_SITE_OTHER): Payer: 59 | Admitting: Internal Medicine

## 2016-01-14 ENCOUNTER — Telehealth: Payer: Self-pay | Admitting: Internal Medicine

## 2016-01-14 VITALS — BP 120/80 | HR 82 | Temp 97.2°F | Ht 68.0 in | Wt 197.5 lb

## 2016-01-14 DIAGNOSIS — K648 Other hemorrhoids: Secondary | ICD-10-CM | POA: Diagnosis not present

## 2016-01-14 DIAGNOSIS — E049 Nontoxic goiter, unspecified: Secondary | ICD-10-CM

## 2016-01-14 DIAGNOSIS — D708 Other neutropenia: Secondary | ICD-10-CM

## 2016-01-14 DIAGNOSIS — Z62810 Personal history of physical and sexual abuse in childhood: Secondary | ICD-10-CM | POA: Diagnosis not present

## 2016-01-14 DIAGNOSIS — J069 Acute upper respiratory infection, unspecified: Secondary | ICD-10-CM | POA: Diagnosis not present

## 2016-01-14 DIAGNOSIS — Z8669 Personal history of other diseases of the nervous system and sense organs: Secondary | ICD-10-CM

## 2016-01-14 DIAGNOSIS — Z0000 Encounter for general adult medical examination without abnormal findings: Secondary | ICD-10-CM

## 2016-01-14 DIAGNOSIS — Z86018 Personal history of other benign neoplasm: Secondary | ICD-10-CM

## 2016-01-14 LAB — POCT URINALYSIS DIPSTICK
Bilirubin, UA: NEGATIVE
Blood, UA: NEGATIVE
GLUCOSE UA: NEGATIVE
Ketones, UA: NEGATIVE
LEUKOCYTES UA: NEGATIVE
NITRITE UA: NEGATIVE
Protein, UA: NEGATIVE
Spec Grav, UA: <=1.005
Urobilinogen, UA: 0.2
pH, UA: 7.5

## 2016-01-14 MED ORDER — HYDROCODONE-HOMATROPINE 5-1.5 MG/5ML PO SYRP: 5.0000 mL | ORAL_SOLUTION | Freq: Three times a day (TID) | ORAL | 0 refills | Status: DC | PRN

## 2016-01-14 MED ORDER — AZITHROMYCIN 250 MG PO TABS: ORAL_TABLET | ORAL | 0 refills | Status: DC

## 2016-01-14 MED FILL — AZITHROMYCIN 250 MG TABLET: 250 | 5 days supply | Qty: 6 | Fill #0
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
## 2016-01-14 NOTE — Telephone Encounter (Signed)
Patient called and states she will get her Tetanus booster through the hospital.  This will prevent you from having to order this for her.    Thank you.

## 2016-01-15 LAB — PATHOLOGIST SMEAR REVIEW

## 2016-01-15 MED FILL — HYDROCODONE-HOMATROPINE SOL: 5-1.5 | 8 days supply | Qty: 120 | Fill #0

## 2016-01-19 ENCOUNTER — Encounter: Payer: Self-pay | Admitting: Internal Medicine

## 2016-01-27 MED FILL — LEVOTHYROXINE 50 MCG TABLET: 50 | 90 days supply | Qty: 90 | Fill #0

## 2016-01-27 MED FILL — FLUTICASONE PROP 50 MCG SPR: 50 | 90 days supply | Qty: 48 | Fill #1

## 2016-02-05 NOTE — Patient Instructions (Signed)
Take Zithromax Z-Pak as directed and Hycodan as needed for respiratory infection symptoms. Continue other medications as previously prescribed. It was pleasure to see you today. Return in one year or as needed.

## 2016-02-12 MED FILL — ESTRACE 0.01% CREAM: 0.1 | 90 days supply | Qty: 43 | Fill #1

## 2016-03-30 MED FILL — BUTALB-ACETAMIN-CAFF 50-325: 50-325-40 | 10 days supply | Qty: 60 | Fill #1

## 2016-04-21 ENCOUNTER — Telehealth: Payer: Self-pay

## 2016-04-21 MED ORDER — FLUCONAZOLE 150 MG PO TABS: 150.0000 mg | ORAL_TABLET | Freq: Once | ORAL | 1 refills | Status: AC

## 2016-04-21 MED FILL — FLUCONAZOLE 150 MG TABLET: 150 | 1 days supply | Qty: 1 | Fill #0

## 2016-04-21 NOTE — Telephone Encounter (Signed)
Pt would like to know if you can prescribe diflucan for a "yeast infection" she said she's had vaginal discharge for a week now. I offered her an appt for Thursday and she declined she wanted to see if you would prescribe it since she's been a patient here for so many years. She said we can call her back tomorrow since this isn't urgent.

## 2016-04-21 NOTE — Telephone Encounter (Signed)
Prescribe Diflucan 150 mg tablet with one refill

## 2016-04-21 NOTE — Telephone Encounter (Signed)
Sent to med center as requested by pt.

## 2016-04-24 MED FILL — LEVOTHYROXINE 50 MCG TABLET: 50 | 90 days supply | Qty: 90 | Fill #1

## 2016-06-04 MED FILL — ESTRACE 0.01% CREAM: 0.1 | 90 days supply | Qty: 43 | Fill #2

## 2016-07-02 DIAGNOSIS — H524 Presbyopia: Secondary | ICD-10-CM | POA: Diagnosis not present

## 2016-07-02 DIAGNOSIS — H5213 Myopia, bilateral: Secondary | ICD-10-CM | POA: Diagnosis not present

## 2016-07-02 DIAGNOSIS — H52223 Regular astigmatism, bilateral: Secondary | ICD-10-CM | POA: Diagnosis not present

## 2016-07-02 DIAGNOSIS — H47322 Drusen of optic disc, left eye: Secondary | ICD-10-CM | POA: Diagnosis not present

## 2016-07-06 ENCOUNTER — Other Ambulatory Visit: Payer: 59 | Admitting: Internal Medicine

## 2016-07-06 DIAGNOSIS — D72819 Decreased white blood cell count, unspecified: Secondary | ICD-10-CM

## 2016-07-06 LAB — CBC WITH DIFFERENTIAL/PLATELET
BASOS ABS: 0 {cells}/uL (ref 0–200)
Basophils Relative: 0 %
EOS PCT: 3 %
Eosinophils Absolute: 93 cells/uL (ref 15–500)
HEMATOCRIT: 37.5 % (ref 35.0–45.0)
HEMOGLOBIN: 11.9 g/dL (ref 11.7–15.5)
LYMPHS ABS: 1116 {cells}/uL (ref 850–3900)
LYMPHS PCT: 36 %
MCH: 28.1 pg (ref 27.0–33.0)
MCHC: 31.7 g/dL — AB (ref 32.0–36.0)
MCV: 88.4 fL (ref 80.0–100.0)
MPV: 9.7 fL (ref 7.5–12.5)
Monocytes Absolute: 372 cells/uL (ref 200–950)
Monocytes Relative: 12 %
NEUTROS PCT: 49 %
Neutro Abs: 1519 cells/uL (ref 1500–7800)
Platelets: 180 10*3/uL (ref 140–400)
RBC: 4.24 MIL/uL (ref 3.80–5.10)
RDW: 14.2 % (ref 11.0–15.0)
WBC: 3.1 10*3/uL — AB (ref 3.8–10.8)

## 2016-07-07 ENCOUNTER — Encounter: Payer: Self-pay | Admitting: Internal Medicine

## 2016-07-07 ENCOUNTER — Ambulatory Visit (INDEPENDENT_AMBULATORY_CARE_PROVIDER_SITE_OTHER): Payer: 59 | Admitting: Internal Medicine

## 2016-07-07 VITALS — BP 128/88 | HR 78 | Temp 97.7°F | Wt 198.0 lb

## 2016-07-07 DIAGNOSIS — D708 Other neutropenia: Secondary | ICD-10-CM | POA: Diagnosis not present

## 2016-07-07 DIAGNOSIS — R0989 Other specified symptoms and signs involving the circulatory and respiratory systems: Secondary | ICD-10-CM

## 2016-07-14 ENCOUNTER — Encounter: Payer: Self-pay | Admitting: Internal Medicine

## 2016-07-14 ENCOUNTER — Other Ambulatory Visit: Payer: 59 | Admitting: Internal Medicine

## 2016-07-16 ENCOUNTER — Ambulatory Visit: Payer: 59 | Admitting: Internal Medicine

## 2016-08-03 NOTE — Progress Notes (Signed)
   Subjective:    Patient ID: Erin [name], female    DOB: [birth year], 51 y.o.   MRN: [id number]  HPI Patient in today to follow-up on low white blood cell count. Recent CBC showed white blood cell count of 3106 months ago was 2900. In October 2016 white blood cell count was 3000 and in 2014 was 3300. It is not uncommon to see low white blood cell counts and black individuals. Suspect this is the case. In November 2017 she had path review of smear. No immature cells were identified.  She feels well. Continues to monitor her blood pressure.  Has elevated BMI. Has been trying to diet and exercise.  History of hemorrhoids-ProctoCream refilled    Review of Systems see above     Objective:   Physical Exam  Not examined. Spent 15 minutes speaking with her about these issues today.      Assessment & Plan:  Mild neutropenia-likely due to inherited condition. Continue to monitor every 6 months.  Elevated BMI--encouraged diet exercise and weight loss  Labile blood pressure readings-continue to monitor  Plan: Return in 6 months for physical examination he around November 2018.

## 2016-08-03 NOTE — Patient Instructions (Signed)
Continue to monitor blood pressure. Encouraged diet exercise and weight loss. Return in 6 months for physical examination at which time CBC will be repeated

## 2016-08-06 MED FILL — LEVOTHYROXINE 50 MCG TABLET: 50 | 90 days supply | Qty: 90 | Fill #2

## 2016-08-11 ENCOUNTER — Other Ambulatory Visit: Payer: 59 | Admitting: Internal Medicine

## 2016-08-11 DIAGNOSIS — R791 Abnormal coagulation profile: Secondary | ICD-10-CM | POA: Diagnosis not present

## 2016-08-11 LAB — PROTIME-INR
INR: 1
Prothrombin Time: 11 s (ref 9.0–11.5)

## 2016-08-11 LAB — APTT: APTT: 28 s (ref 22–34)

## 2016-08-13 ENCOUNTER — Other Ambulatory Visit: Payer: 59 | Admitting: Internal Medicine

## 2016-08-14 ENCOUNTER — Telehealth: Payer: Self-pay

## 2016-08-14 NOTE — Telephone Encounter (Signed)
Per Dr. Lauree Chandler request I called and informed patient of normal labs results. Pt is aware and has no questions or concerns at this time

## 2016-09-14 MED FILL — MUPIROCIN 2% OINTMENT: 2 | 10 days supply | Qty: 22 | Fill #1

## 2016-10-20 ENCOUNTER — Other Ambulatory Visit: Payer: Self-pay | Admitting: Internal Medicine

## 2016-10-21 MED FILL — MUPIROCIN 2% OINTMENT: 2 | 10 days supply | Qty: 22 | Fill #0

## 2016-10-21 MED FILL — FLUTICASONE PROP 50 MCG SPR: 50 | 90 days supply | Qty: 48 | Fill #0

## 2016-10-21 MED FILL — ANUCORT-HC 25 MG SUPPOSITOR: 25 | 6 days supply | Qty: 12 | Fill #0

## 2016-11-02 ENCOUNTER — Other Ambulatory Visit (HOSPITAL_BASED_OUTPATIENT_CLINIC_OR_DEPARTMENT_OTHER): Payer: Self-pay | Admitting: Obstetrics and Gynecology

## 2016-11-02 DIAGNOSIS — Z1231 Encounter for screening mammogram for malignant neoplasm of breast: Secondary | ICD-10-CM

## 2016-11-03 MED FILL — LEVOTHYROXINE 50 MCG TABLET: 50 | 90 days supply | Qty: 90 | Fill #3

## 2016-11-30 ENCOUNTER — Encounter: Payer: Self-pay | Admitting: Internal Medicine

## 2016-12-01 DIAGNOSIS — N952 Postmenopausal atrophic vaginitis: Secondary | ICD-10-CM | POA: Diagnosis not present

## 2016-12-01 DIAGNOSIS — Z124 Encounter for screening for malignant neoplasm of cervix: Secondary | ICD-10-CM | POA: Diagnosis not present

## 2016-12-01 DIAGNOSIS — Z6829 Body mass index (BMI) 29.0-29.9, adult: Secondary | ICD-10-CM | POA: Diagnosis not present

## 2016-12-01 DIAGNOSIS — Z01419 Encounter for gynecological examination (general) (routine) without abnormal findings: Secondary | ICD-10-CM | POA: Diagnosis not present

## 2016-12-10 ENCOUNTER — Other Ambulatory Visit: Payer: Self-pay | Admitting: Internal Medicine

## 2016-12-10 MED FILL — MUPIROCIN 2% OINTMENT: 2 | 10 days supply | Qty: 22 | Fill #1

## 2016-12-10 MED FILL — ESTRADIOL 0.1 MG/GM CRM: 0.1 | 84 days supply | Qty: 43 | Fill #0

## 2016-12-10 MED FILL — BUTALB-ACETAMIN-CAFF 50-325: 50-325-40 | 5 days supply | Qty: 60 | Fill #0

## 2016-12-10 NOTE — Telephone Encounter (Signed)
Refill for 90 days

## 2016-12-17 ENCOUNTER — Ambulatory Visit (INDEPENDENT_AMBULATORY_CARE_PROVIDER_SITE_OTHER): Payer: 59 | Admitting: Internal Medicine

## 2016-12-17 ENCOUNTER — Encounter: Payer: Self-pay | Admitting: Internal Medicine

## 2016-12-17 VITALS — BP 110/78 | HR 71 | Temp 97.8°F | Wt 194.5 lb

## 2016-12-17 DIAGNOSIS — R350 Frequency of micturition: Secondary | ICD-10-CM

## 2016-12-17 LAB — POCT URINALYSIS DIPSTICK
Bilirubin, UA: NEGATIVE
Blood, UA: NEGATIVE
Glucose, UA: NEGATIVE
LEUKOCYTES UA: NEGATIVE
Nitrite, UA: NEGATIVE
PROTEIN UA: NEGATIVE
SPEC GRAV UA: 1.025 (ref 1.010–1.025)
UROBILINOGEN UA: 0.2 U/dL
pH, UA: 7 (ref 5.0–8.0)

## 2016-12-17 MED ORDER — VENTOLIN HFA 108 (90 BASE) MCG/ACT IN AERS: INHALATION_SPRAY | RESPIRATORY_TRACT | 11 refills | Status: DC

## 2016-12-17 MED ORDER — CIPROFLOXACIN HCL 500 MG PO TABS: 500.0000 mg | ORAL_TABLET | Freq: Two times a day (BID) | ORAL | 0 refills | Status: DC

## 2016-12-17 MED FILL — VENTOLIN HFA 90 MCG INHALER: 108 (90 BAS | 25 days supply | Qty: 18 | Fill #0

## 2016-12-17 MED FILL — CIPROFLOXACIN HCL 500 MG TA: 500 | 3 days supply | Qty: 6 | Fill #0

## 2016-12-17 NOTE — Patient Instructions (Signed)
Cipro 500 mg twice a day for 3 days.

## 2016-12-17 NOTE — Progress Notes (Signed)
   Subjective:    Patient ID: Erin [name], female    DOB: 07-13-1965, 51 y.o.   MRN: [id number]  HPI Patient complaining of urinary frequency.  No fever or chills.  No dysuria.  Dipstick UA shows only trace ketones but no nitrite and no leukocyte esterase.  Culture was sent.    Review of Systems see above     Objective:   Physical Exam No CVA tenderness.  See dipstick UA       Assessment & Plan:  Urethritis  Plan: Cipro 500 mg twice a day for 3 days pending urine culture  Addendum: No significant growth.  Multiple species on culture.

## 2016-12-18 LAB — URINE CULTURE
MICRO NUMBER:: 81134781
SPECIMEN QUALITY: ADEQUATE

## 2016-12-22 DIAGNOSIS — E049 Nontoxic goiter, unspecified: Secondary | ICD-10-CM | POA: Diagnosis not present

## 2016-12-24 ENCOUNTER — Encounter (HOSPITAL_BASED_OUTPATIENT_CLINIC_OR_DEPARTMENT_OTHER): Payer: Self-pay

## 2016-12-24 ENCOUNTER — Ambulatory Visit (HOSPITAL_BASED_OUTPATIENT_CLINIC_OR_DEPARTMENT_OTHER)
Admission: RE | Admit: 2016-12-24 | Discharge: 2016-12-24 | Disposition: A | Discharge status: Home / Self Care | Payer: 59 | Source: Ambulatory Visit | Attending: Obstetrics and Gynecology | Admitting: Obstetrics and Gynecology

## 2016-12-24 DIAGNOSIS — Z1231 Encounter for screening mammogram for malignant neoplasm of breast: Secondary | ICD-10-CM | POA: Insufficient documentation

## 2017-01-12 ENCOUNTER — Other Ambulatory Visit: Payer: 59 | Admitting: Internal Medicine

## 2017-01-12 DIAGNOSIS — Z0000 Encounter for general adult medical examination without abnormal findings: Secondary | ICD-10-CM

## 2017-01-12 DIAGNOSIS — Z13220 Encounter for screening for lipoid disorders: Secondary | ICD-10-CM

## 2017-01-12 DIAGNOSIS — E039 Hypothyroidism, unspecified: Secondary | ICD-10-CM | POA: Diagnosis not present

## 2017-01-12 DIAGNOSIS — J309 Allergic rhinitis, unspecified: Secondary | ICD-10-CM

## 2017-01-12 DIAGNOSIS — Z130 Encounter for screening for diseases of the blood and blood-forming organs and certain disorders involving the immune mechanism: Secondary | ICD-10-CM

## 2017-01-12 DIAGNOSIS — Z1321 Encounter for screening for nutritional disorder: Secondary | ICD-10-CM

## 2017-01-13 LAB — COMPLETE METABOLIC PANEL WITH GFR
AG RATIO: 1.4 (calc) (ref 1.0–2.5)
ALBUMIN MSPROF: 3.9 g/dL (ref 3.6–5.1)
ALKALINE PHOSPHATASE (APISO): 55 U/L (ref 33–130)
ALT: 7 U/L (ref 6–29)
AST: 12 U/L (ref 10–35)
BILIRUBIN TOTAL: 0.5 mg/dL (ref 0.2–1.2)
BUN: 11 mg/dL (ref 7–25)
CHLORIDE: 108 mmol/L (ref 98–110)
CO2: 26 mmol/L (ref 20–32)
Calcium: 9.1 mg/dL (ref 8.6–10.4)
Creat: 0.67 mg/dL (ref 0.50–1.05)
GFR, EST AFRICAN AMERICAN: 117 mL/min/{1.73_m2} (ref >=60)
GFR, Est Non African American: 101 mL/min/{1.73_m2} (ref >=60)
GLOBULIN: 2.7 g/dL (ref 1.9–3.7)
GLUCOSE: 86 mg/dL (ref 65–99)
POTASSIUM: 3.9 mmol/L (ref 3.5–5.3)
SODIUM: 141 mmol/L (ref 135–146)
TOTAL PROTEIN: 6.6 g/dL (ref 6.1–8.1)

## 2017-01-13 LAB — LIPID PANEL
CHOLESTEROL: 166 mg/dL (ref <200)
HDL: 55 mg/dL (ref >50)
LDL Cholesterol (Calc): 93 mg/dL (calc)
Non-HDL Cholesterol (Calc): 111 mg/dL (calc) (ref <130)
Total CHOL/HDL Ratio: 3 (calc) (ref <5.0)
Triglycerides: 85 mg/dL (ref <150)

## 2017-01-13 LAB — CBC WITH DIFFERENTIAL/PLATELET
BASOS ABS: 31 {cells}/uL (ref 0–200)
Basophils Relative: 1.1 %
EOS ABS: 132 {cells}/uL (ref 15–500)
Eosinophils Relative: 4.7 %
HEMATOCRIT: 36.8 % (ref 35.0–45.0)
HEMOGLOBIN: 12 g/dL (ref 11.7–15.5)
Lymphs Abs: 1140 cells/uL (ref 850–3900)
MCH: 28.1 pg (ref 27.0–33.0)
MCHC: 32.6 g/dL (ref 32.0–36.0)
MCV: 86.2 fL (ref 80.0–100.0)
MONOS PCT: 10.9 %
MPV: 10.2 fL (ref 7.5–12.5)
NEUTROS ABS: 1193 {cells}/uL — AB (ref 1500–7800)
Neutrophils Relative %: 42.6 %
Platelets: 154 10*3/uL (ref 140–400)
RBC: 4.27 10*6/uL (ref 3.80–5.10)
RDW: 12.6 % (ref 11.0–15.0)
Total Lymphocyte: 40.7 %
WBC: 2.8 10*3/uL — ABNORMAL LOW (ref 3.8–10.8)
WBCMIX: 305 {cells}/uL (ref 200–950)

## 2017-01-13 LAB — TSH: TSH: 0.61 m[IU]/L

## 2017-01-13 LAB — VITAMIN D 25 HYDROXY (VIT D DEFICIENCY, FRACTURES): VIT D 25 HYDROXY: 34 ng/mL (ref 30–100)

## 2017-01-14 ENCOUNTER — Encounter: Payer: Self-pay | Admitting: Internal Medicine

## 2017-01-14 ENCOUNTER — Ambulatory Visit (INDEPENDENT_AMBULATORY_CARE_PROVIDER_SITE_OTHER): Payer: 59 | Admitting: Internal Medicine

## 2017-01-14 VITALS — BP 108/62 | HR 79 | Temp 97.8°F | Ht 68.0 in | Wt 194.0 lb

## 2017-01-14 DIAGNOSIS — J069 Acute upper respiratory infection, unspecified: Secondary | ICD-10-CM

## 2017-01-14 DIAGNOSIS — D708 Other neutropenia: Secondary | ICD-10-CM | POA: Diagnosis not present

## 2017-01-14 DIAGNOSIS — Z8669 Personal history of other diseases of the nervous system and sense organs: Secondary | ICD-10-CM

## 2017-01-14 DIAGNOSIS — Z86018 Personal history of other benign neoplasm: Secondary | ICD-10-CM

## 2017-01-14 DIAGNOSIS — R0989 Other specified symptoms and signs involving the circulatory and respiratory systems: Secondary | ICD-10-CM | POA: Diagnosis not present

## 2017-01-14 DIAGNOSIS — K648 Other hemorrhoids: Secondary | ICD-10-CM | POA: Diagnosis not present

## 2017-01-14 DIAGNOSIS — E049 Nontoxic goiter, unspecified: Secondary | ICD-10-CM | POA: Diagnosis not present

## 2017-01-14 DIAGNOSIS — Z0000 Encounter for general adult medical examination without abnormal findings: Secondary | ICD-10-CM

## 2017-01-14 LAB — POCT URINALYSIS DIPSTICK
Bilirubin, UA: NEGATIVE
Blood, UA: NEGATIVE
Glucose, UA: NEGATIVE
KETONES UA: NEGATIVE
LEUKOCYTES UA: NEGATIVE
Nitrite, UA: NEGATIVE
PROTEIN UA: NEGATIVE
Spec Grav, UA: 1.01 (ref 1.010–1.025)
Urobilinogen, UA: 0.2 E.U./dL
pH, UA: 6 (ref 5.0–8.0)

## 2017-01-21 ENCOUNTER — Encounter: Payer: Self-pay | Admitting: Internal Medicine

## 2017-01-21 ENCOUNTER — Ambulatory Visit (INDEPENDENT_AMBULATORY_CARE_PROVIDER_SITE_OTHER): Payer: 59 | Admitting: Internal Medicine

## 2017-01-21 VITALS — BP 104/78 | HR 75 | Temp 97.6°F | Wt 193.0 lb

## 2017-01-21 DIAGNOSIS — J069 Acute upper respiratory infection, unspecified: Secondary | ICD-10-CM | POA: Diagnosis not present

## 2017-01-21 MED ORDER — AZITHROMYCIN 250 MG PO TABS: ORAL_TABLET | ORAL | 1 refills | Status: DC

## 2017-01-21 MED ORDER — FLUCONAZOLE 150 MG PO TABS: 150.0000 mg | ORAL_TABLET | Freq: Once | ORAL | 1 refills | Status: AC

## 2017-01-21 MED ORDER — BENZONATATE 100 MG PO CAPS: 100.0000 mg | ORAL_CAPSULE | Freq: Three times a day (TID) | ORAL | 2 refills | Status: DC

## 2017-01-21 MED ORDER — HYDROCODONE-HOMATROPINE 5-1.5 MG/5ML PO SYRP: 5.0000 mL | ORAL_SOLUTION | Freq: Three times a day (TID) | ORAL | 0 refills | Status: DC | PRN

## 2017-01-21 MED FILL — BENZONATATE 100 MG CAPSULE: 100 | 10 days supply | Qty: 30 | Fill #0

## 2017-01-21 MED FILL — AZITHROMYCIN 250 MG TABLET: 250 | 5 days supply | Qty: 6 | Fill #0

## 2017-01-21 MED FILL — HYDROCODONE-HOMATROPINE SOL: 5-1.5 | 8 days supply | Qty: 120 | Fill #0

## 2017-01-21 MED FILL — FLUCONAZOLE 150 MG TABLET: 150 | 1 days supply | Qty: 1 | Fill #0

## 2017-01-21 NOTE — Progress Notes (Signed)
   Subjective:    Patient ID: Erin [name], female    DOB: 06-17-1965, 51 y.o.   MRN: [id number]  HPI 51 year old Female has come down with URI symptoms.  His cough and congestion but no wheezing.  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  Has had flu vaccine.  No fever or shaking chills.    Review of Systems not much of a productive cough.  Mainly head congestion     Objective:   Physical Exam Skin warm and dry.  Nodes none.  Neck is supple.  She has a goiter that is being followed by Dr. Chalmers [name].  Is on thyroid suppression therapy chest clear to auscultation without rales or wheezing.  Pharynx very slightly injected.  TMs are okay.       Assessment & Plan:  Acute URI  Plan: Hycodan 1 teaspoon p.o. every 8 hours as needed cough.  Tessalon Perles 100 mg 3 times daily as needed for cough during the daytime hours.  Zithromax Z-Pak take 2 tablets day 1 followed by 1 tablet days 2 through 5 with 1 refill.  To have on hand for wheezing and congestion prednisone 10 mg going from 60 mg to 0 mg over 7 days if symptoms do not improve.  Refill albuterol inhaler for 1 year.

## 2017-01-22 ENCOUNTER — Telehealth: Payer: Self-pay | Admitting: Internal Medicine

## 2017-01-22 MED ORDER — PREDNISONE 10 MG PO TABS: ORAL_TABLET | ORAL | 0 refills | Status: DC

## 2017-01-22 MED FILL — predniSONE 10 MG TABS: 10 | 7 days supply | Qty: 21 | Fill #0

## 2017-01-22 NOTE — Telephone Encounter (Signed)
Patient advised that when she got to the pharmacy, there wasn't any Prednisone.  Wanted to know if you would please send that to her pharmacy please.    Pharmacy:  [facility]  Thank you.   Best # for contact:  [phone]

## 2017-01-22 NOTE — Telephone Encounter (Signed)
Sent to pharmacy 

## 2017-01-22 NOTE — Telephone Encounter (Signed)
Please call in Prednisone 10 mg #21 to take in tapering coourse as directed for wheezing 6-5-4-3-2-1

## 2017-01-24 NOTE — Patient Instructions (Addendum)
It was a pleasure to see you today.  Take Zithromax as directed.  Take prednisone if needed.  Ventolin inhaler refilled. Monitor blood pressure at work and home.
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
## 2017-01-31 NOTE — Patient Instructions (Signed)
Take either Hycodan or Tessalon Perles for cough.  Zithromax Z-Pak as directed with 1 refill.  Rest and drink plenty of fluids.  Albuterol inhaler refilled.  Diflucan prescribed to have on hand should you develop Candida vaginitis while on antibiotic therapy.  Prednisone if needed for wheezing.

## 2017-02-08 MED FILL — LEVOTHYROXINE 50 MCG TABLET: 50 | 90 days supply | Qty: 90 | Fill #0

## 2017-02-11 ENCOUNTER — Other Ambulatory Visit: Payer: Self-pay | Admitting: Internal Medicine

## 2017-02-11 MED FILL — ANUCORT-HC 25 MG SUPPOSITOR: 25 | 6 days supply | Qty: 12 | Fill #1

## 2017-02-11 MED FILL — AZITHROMYCIN 250 MG TABLET: 250 | 5 days supply | Qty: 6 | Fill #1

## 2017-02-12 ENCOUNTER — Ambulatory Visit: Payer: Self-pay | Admitting: Internal Medicine

## 2017-02-23 DIAGNOSIS — E049 Nontoxic goiter, unspecified: Secondary | ICD-10-CM | POA: Diagnosis not present

## 2017-03-01 ENCOUNTER — Encounter: Payer: Self-pay | Admitting: Internal Medicine

## 2017-03-01 ENCOUNTER — Ambulatory Visit (INDEPENDENT_AMBULATORY_CARE_PROVIDER_SITE_OTHER): Payer: 59 | Admitting: Internal Medicine

## 2017-03-01 ENCOUNTER — Ambulatory Visit (HOSPITAL_BASED_OUTPATIENT_CLINIC_OR_DEPARTMENT_OTHER)
Admission: RE | Admit: 2017-03-01 | Discharge: 2017-03-01 | Disposition: A | Discharge status: Home / Self Care | Payer: 59 | Source: Ambulatory Visit | Attending: Internal Medicine | Admitting: Internal Medicine

## 2017-03-01 VITALS — Ht 68.0 in

## 2017-03-01 DIAGNOSIS — R05 Cough: Secondary | ICD-10-CM | POA: Diagnosis not present

## 2017-03-01 DIAGNOSIS — R059 Cough, unspecified: Secondary | ICD-10-CM

## 2017-03-01 MED ORDER — FLUCONAZOLE 150 MG PO TABS: 150.0000 mg | ORAL_TABLET | Freq: Once | ORAL | 1 refills | Status: AC

## 2017-03-01 MED ORDER — METHYLPREDNISOLONE ACETATE 80 MG/ML IJ SUSP
80.0000 mg | Freq: Once | INTRAMUSCULAR | Status: AC
  Administered 2017-03-01: 80 mg via INTRAMUSCULAR

## 2017-03-01 MED ORDER — DOXYCYCLINE HYCLATE 100 MG PO TABS: 100.0000 mg | ORAL_TABLET | Freq: Two times a day (BID) | ORAL | 0 refills | Status: DC

## 2017-03-01 MED FILL — FLUCONAZOLE 150 MG TABLET: 150 | 1 days supply | Qty: 1 | Fill #0

## 2017-03-01 MED FILL — DOXYCYCLINE HYCLATE 100 MG: 100 | 10 days supply | Qty: 20 | Fill #0

## 2017-03-01 NOTE — Progress Notes (Signed)
   Subjective:    Patient ID: Magda Kiel, female    DOB: 1965/09/06, 51 y.o.   MRN: [id number]  HPI Took Z pak in September for URI and was seen in November for cough. She was given Z pak with refill which she took along with Prednisone, inhaler, Hycodan and Tessalon perles. Still has cough.  Has seen Dr. Shaune Leeks years ago. No wheezing currently, just cough. Works as a Insurance account manager. No recent CXR. Hx goiter.    Review of Systems see above- no fever or flu symptoms     Objective:   Physical Exam  Constitutional: She appears well-developed and well-nourished. No distress.  HENT:  Right Ear: External ear normal.  Left Ear: External ear normal.  Mouth/Throat: Oropharynx is clear and moist.  Neck: Neck supple.  Goiter stable  Pulmonary/Chest: No respiratory distress. She has no wheezes. She has no rales.  Lymphadenopathy:    She has no cervical adenopathy.  Skin: No rash noted. She is not diaphoretic.  Vitals reviewed.         Assessment & Plan:  Hx bronchospasm- no wheezing currently Protracted cough- needs to see allergist for evaluation. May have allergic rhinitis and/ or GERD component. Depomedrol 80 mg IM. Doxycycline 100 mg twice a day x 10 days.

## 2017-03-03 NOTE — Patient Instructions (Signed)
To see allergist in the near future. Have CXR. Depomedrol 80 mg IM

## 2017-05-04 MED FILL — LEVOTHYROXINE 50 MCG TABLET: 50 | 90 days supply | Qty: 90 | Fill #1

## 2017-07-16 ENCOUNTER — Encounter: Payer: Self-pay | Admitting: Internal Medicine

## 2017-07-16 ENCOUNTER — Ambulatory Visit (INDEPENDENT_AMBULATORY_CARE_PROVIDER_SITE_OTHER): Payer: 59 | Admitting: Internal Medicine

## 2017-07-16 ENCOUNTER — Ambulatory Visit: Payer: 59 | Admitting: Internal Medicine

## 2017-07-16 VITALS — BP 130/90 | HR 73 | Temp 97.9°F | Ht 68.0 in | Wt 193.0 lb

## 2017-07-16 DIAGNOSIS — M25549 Pain in joints of unspecified hand: Secondary | ICD-10-CM | POA: Diagnosis not present

## 2017-07-16 DIAGNOSIS — M19049 Primary osteoarthritis, unspecified hand: Secondary | ICD-10-CM | POA: Diagnosis not present

## 2017-07-16 DIAGNOSIS — H5213 Myopia, bilateral: Secondary | ICD-10-CM | POA: Diagnosis not present

## 2017-07-16 DIAGNOSIS — H52223 Regular astigmatism, bilateral: Secondary | ICD-10-CM | POA: Diagnosis not present

## 2017-07-16 DIAGNOSIS — H524 Presbyopia: Secondary | ICD-10-CM | POA: Diagnosis not present

## 2017-07-19 ENCOUNTER — Telehealth: Payer: Self-pay | Admitting: Internal Medicine

## 2017-07-19 LAB — SEDIMENTATION RATE: SED RATE: 6 mm/h (ref 0–30)

## 2017-07-19 LAB — CYCLIC CITRUL PEPTIDE ANTIBODY, IGG: Cyclic Citrullin Peptide Ab: <16 UNITS

## 2017-07-19 NOTE — Patient Instructions (Addendum)
CCP and sed rate pending.  Refer to Rheumatologist.  Has aspirin and NSAID allergies.

## 2017-07-19 NOTE — Telephone Encounter (Signed)
Faxed 17 pages to [facility] [phone] for referral

## 2017-07-19 NOTE — Progress Notes (Signed)
   Subjective:    Patient ID: Erin [name], female    DOB: 1965/03/24, 52 y.o.   MRN: [id number]  HPI 52 year old Female in today to discuss bilateral hand pain.  She is a cardiac rehab nurse.  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  Has noticed pain in the joints of both hands.  No redness or increased warmth.  Unfortunately she is allergic to aspirin and NSAIDs both of which cause hives and wheezing.  She has a shellfish allergy.    Review of Systems see above     Objective:   Physical Exam  She has prominent Heberden's and Bouchard's nodes bilaterally.  These are tender to touch but are not red.  They do not have increased warmth.      Assessment & Plan:  Osteoarthritis of hands  NSAID and aspirin allergy  Plan: Sed rate and CCP drawn.  Results are pending.  Refer to rheumatologist for discussion of treatment

## 2017-07-21 ENCOUNTER — Telehealth: Payer: Self-pay | Admitting: Internal Medicine

## 2017-07-21 NOTE — Telephone Encounter (Signed)
Appointment scheduled with Gavin Pound, MD for 09/07/17 @ 8:30am

## 2017-07-21 NOTE — Telephone Encounter (Signed)
Faxed referral to [facility]. 

## 2017-08-09 MED FILL — LEVOTHYROXINE 50 MCG TABLET: 50 | 90 days supply | Qty: 90 | Fill #2

## 2017-09-07 ENCOUNTER — Other Ambulatory Visit (HOSPITAL_BASED_OUTPATIENT_CLINIC_OR_DEPARTMENT_OTHER): Payer: Self-pay | Admitting: Obstetrics and Gynecology

## 2017-09-07 DIAGNOSIS — M79642 Pain in left hand: Secondary | ICD-10-CM | POA: Diagnosis not present

## 2017-09-07 DIAGNOSIS — M150 Primary generalized (osteo)arthritis: Secondary | ICD-10-CM | POA: Diagnosis not present

## 2017-09-07 DIAGNOSIS — Z6829 Body mass index (BMI) 29.0-29.9, adult: Secondary | ICD-10-CM | POA: Diagnosis not present

## 2017-09-07 DIAGNOSIS — E663 Overweight: Secondary | ICD-10-CM | POA: Diagnosis not present

## 2017-09-07 DIAGNOSIS — Z1231 Encounter for screening mammogram for malignant neoplasm of breast: Secondary | ICD-10-CM

## 2017-09-07 DIAGNOSIS — M79641 Pain in right hand: Secondary | ICD-10-CM | POA: Diagnosis not present

## 2017-09-08 MED FILL — MUPIROCIN 2% OINTMENT: 2 | 15 days supply | Qty: 22 | Fill #0

## 2017-10-19 MED FILL — MUPIROCIN 2% OINTMENT: 2 | 15 days supply | Qty: 22 | Fill #1

## 2017-11-01 MED FILL — LEVOTHYROXINE 50 MCG TABLET: 50 | 90 days supply | Qty: 90 | Fill #3

## 2017-11-15 ENCOUNTER — Other Ambulatory Visit: Payer: Self-pay | Admitting: Internal Medicine

## 2017-11-16 MED FILL — HEMMOREX-HC 25 MG SUPP: 25 | 6 days supply | Qty: 12 | Fill #0

## 2017-11-26 ENCOUNTER — Other Ambulatory Visit: Payer: Self-pay | Admitting: Internal Medicine

## 2017-11-29 MED FILL — BUTALB-ACETAMIN-CAFF 50-325: 50-325-40 | 10 days supply | Qty: 60 | Fill #1

## 2017-11-29 MED FILL — PROCTO-MED HC 2.5% CREAM: 2.5 | 10 days supply | Qty: 30 | Fill #0

## 2017-12-10 DIAGNOSIS — Z6828 Body mass index (BMI) 28.0-28.9, adult: Secondary | ICD-10-CM | POA: Diagnosis not present

## 2017-12-10 DIAGNOSIS — Z01419 Encounter for gynecological examination (general) (routine) without abnormal findings: Secondary | ICD-10-CM | POA: Diagnosis not present

## 2017-12-10 DIAGNOSIS — D259 Leiomyoma of uterus, unspecified: Secondary | ICD-10-CM | POA: Diagnosis not present

## 2017-12-24 ENCOUNTER — Ambulatory Visit (HOSPITAL_BASED_OUTPATIENT_CLINIC_OR_DEPARTMENT_OTHER)
Admission: RE | Admit: 2017-12-24 | Discharge: 2017-12-24 | Disposition: A | Discharge status: Home / Self Care | Payer: 59 | Source: Ambulatory Visit | Attending: Obstetrics and Gynecology | Admitting: Obstetrics and Gynecology

## 2017-12-24 DIAGNOSIS — Z1231 Encounter for screening mammogram for malignant neoplasm of breast: Secondary | ICD-10-CM | POA: Insufficient documentation

## 2017-12-27 ENCOUNTER — Other Ambulatory Visit: Payer: Self-pay | Admitting: Obstetrics and Gynecology

## 2017-12-27 DIAGNOSIS — R928 Other abnormal and inconclusive findings on diagnostic imaging of breast: Secondary | ICD-10-CM

## 2017-12-28 ENCOUNTER — Ambulatory Visit: Payer: 59

## 2017-12-28 ENCOUNTER — Ambulatory Visit
Admission: RE | Admit: 2017-12-28 | Discharge: 2017-12-28 | Disposition: A | Discharge status: Home / Self Care | Payer: 59 | Source: Ambulatory Visit | Attending: Obstetrics and Gynecology | Admitting: Obstetrics and Gynecology

## 2017-12-28 DIAGNOSIS — R928 Other abnormal and inconclusive findings on diagnostic imaging of breast: Secondary | ICD-10-CM

## 2017-12-28 DIAGNOSIS — R922 Inconclusive mammogram: Secondary | ICD-10-CM | POA: Diagnosis not present

## 2017-12-31 ENCOUNTER — Ambulatory Visit (HOSPITAL_BASED_OUTPATIENT_CLINIC_OR_DEPARTMENT_OTHER): Payer: 59

## 2018-01-05 ENCOUNTER — Other Ambulatory Visit: Payer: Self-pay | Admitting: Internal Medicine

## 2018-01-05 MED FILL — FLUTICASONE PROP 50 MCG SPR: 50 | 90 days supply | Qty: 48 | Fill #0

## 2018-01-17 ENCOUNTER — Other Ambulatory Visit: Payer: Self-pay | Admitting: Internal Medicine

## 2018-01-17 DIAGNOSIS — Z8669 Personal history of other diseases of the nervous system and sense organs: Secondary | ICD-10-CM

## 2018-01-17 DIAGNOSIS — E049 Nontoxic goiter, unspecified: Secondary | ICD-10-CM

## 2018-01-17 DIAGNOSIS — D708 Other neutropenia: Secondary | ICD-10-CM

## 2018-01-17 DIAGNOSIS — Z0000 Encounter for general adult medical examination without abnormal findings: Secondary | ICD-10-CM

## 2018-01-17 DIAGNOSIS — K648 Other hemorrhoids: Secondary | ICD-10-CM

## 2018-01-17 DIAGNOSIS — R0989 Other specified symptoms and signs involving the circulatory and respiratory systems: Secondary | ICD-10-CM

## 2018-01-17 DIAGNOSIS — Z86018 Personal history of other benign neoplasm: Secondary | ICD-10-CM

## 2018-01-18 ENCOUNTER — Other Ambulatory Visit: Payer: 59 | Admitting: Internal Medicine

## 2018-01-18 DIAGNOSIS — Z8669 Personal history of other diseases of the nervous system and sense organs: Secondary | ICD-10-CM | POA: Diagnosis not present

## 2018-01-18 DIAGNOSIS — Z0000 Encounter for general adult medical examination without abnormal findings: Secondary | ICD-10-CM

## 2018-01-18 DIAGNOSIS — Z86018 Personal history of other benign neoplasm: Secondary | ICD-10-CM | POA: Diagnosis not present

## 2018-01-18 DIAGNOSIS — E049 Nontoxic goiter, unspecified: Secondary | ICD-10-CM

## 2018-01-18 DIAGNOSIS — K648 Other hemorrhoids: Secondary | ICD-10-CM

## 2018-01-18 DIAGNOSIS — D708 Other neutropenia: Secondary | ICD-10-CM

## 2018-01-18 DIAGNOSIS — R0989 Other specified symptoms and signs involving the circulatory and respiratory systems: Secondary | ICD-10-CM | POA: Diagnosis not present

## 2018-01-19 LAB — COMPLETE METABOLIC PANEL WITH GFR
AG Ratio: 1.5 (calc) (ref 1.0–2.5)
ALT: 9 U/L (ref 6–29)
AST: 13 U/L (ref 10–35)
Albumin: 4.1 g/dL (ref 3.6–5.1)
Alkaline phosphatase (APISO): 58 U/L (ref 33–130)
BUN: 10 mg/dL (ref 7–25)
CALCIUM: 9.2 mg/dL (ref 8.6–10.4)
CO2: 28 mmol/L (ref 20–32)
CREATININE: 0.69 mg/dL (ref 0.50–1.05)
Chloride: 107 mmol/L (ref 98–110)
GFR, EST AFRICAN AMERICAN: 115 mL/min/{1.73_m2} (ref >=60)
GFR, Est Non African American: 99 mL/min/{1.73_m2} (ref >=60)
GLOBULIN: 2.8 g/dL (ref 1.9–3.7)
Glucose, Bld: 86 mg/dL (ref 65–99)
Potassium: 3.9 mmol/L (ref 3.5–5.3)
Sodium: 140 mmol/L (ref 135–146)
TOTAL PROTEIN: 6.9 g/dL (ref 6.1–8.1)
Total Bilirubin: 0.5 mg/dL (ref 0.2–1.2)

## 2018-01-19 LAB — CBC WITH DIFFERENTIAL/PLATELET
BASOS ABS: 31 {cells}/uL (ref 0–200)
BASOS PCT: 1.1 %
Eosinophils Absolute: 118 cells/uL (ref 15–500)
Eosinophils Relative: 4.2 %
HEMATOCRIT: 36.3 % (ref 35.0–45.0)
HEMOGLOBIN: 12.1 g/dL (ref 11.7–15.5)
Lymphs Abs: 1176 cells/uL (ref 850–3900)
MCH: 28.8 pg (ref 27.0–33.0)
MCHC: 33.3 g/dL (ref 32.0–36.0)
MCV: 86.4 fL (ref 80.0–100.0)
MPV: 10.1 fL (ref 7.5–12.5)
Monocytes Relative: 11.3 %
NEUTROS ABS: 1159 {cells}/uL — AB (ref 1500–7800)
Neutrophils Relative %: 41.4 %
Platelets: 175 10*3/uL (ref 140–400)
RBC: 4.2 10*6/uL (ref 3.80–5.10)
RDW: 12.4 % (ref 11.0–15.0)
Total Lymphocyte: 42 %
WBC: 2.8 10*3/uL — ABNORMAL LOW (ref 3.8–10.8)
WBCMIX: 316 {cells}/uL (ref 200–950)

## 2018-01-19 LAB — VITAMIN D 25 HYDROXY (VIT D DEFICIENCY, FRACTURES): Vit D, 25-Hydroxy: 27 ng/mL — ABNORMAL LOW (ref 30–100)

## 2018-01-19 LAB — LIPID PANEL
CHOL/HDL RATIO: 3.7 (calc) (ref <5.0)
CHOLESTEROL: 188 mg/dL (ref <200)
HDL: 51 mg/dL (ref >50)
LDL Cholesterol (Calc): 120 mg/dL (calc) — ABNORMAL HIGH
NON-HDL CHOLESTEROL (CALC): 137 mg/dL — AB (ref <130)
Triglycerides: 71 mg/dL (ref <150)

## 2018-01-19 LAB — TSH: TSH: 0.85 mIU/L

## 2018-01-20 ENCOUNTER — Ambulatory Visit (INDEPENDENT_AMBULATORY_CARE_PROVIDER_SITE_OTHER): Payer: 59 | Admitting: Internal Medicine

## 2018-01-20 ENCOUNTER — Encounter: Payer: Self-pay | Admitting: Internal Medicine

## 2018-01-20 VITALS — BP 110/82 | HR 82 | Ht 68.0 in | Wt 194.0 lb

## 2018-01-20 DIAGNOSIS — D708 Other neutropenia: Secondary | ICD-10-CM | POA: Diagnosis not present

## 2018-01-20 DIAGNOSIS — Z86018 Personal history of other benign neoplasm: Secondary | ICD-10-CM

## 2018-01-20 DIAGNOSIS — K648 Other hemorrhoids: Secondary | ICD-10-CM

## 2018-01-20 DIAGNOSIS — Z8669 Personal history of other diseases of the nervous system and sense organs: Secondary | ICD-10-CM

## 2018-01-20 DIAGNOSIS — J309 Allergic rhinitis, unspecified: Secondary | ICD-10-CM

## 2018-01-20 DIAGNOSIS — Z0000 Encounter for general adult medical examination without abnormal findings: Secondary | ICD-10-CM | POA: Diagnosis not present

## 2018-01-20 DIAGNOSIS — E049 Nontoxic goiter, unspecified: Secondary | ICD-10-CM | POA: Diagnosis not present

## 2018-01-21 LAB — POCT URINALYSIS DIPSTICK
Appearance: NEGATIVE
BILIRUBIN UA: NEGATIVE
Blood, UA: NEGATIVE
GLUCOSE UA: NEGATIVE
Ketones, UA: NEGATIVE
Leukocytes, UA: NEGATIVE
Nitrite, UA: NEGATIVE
ODOR: NEGATIVE
Protein, UA: NEGATIVE
Spec Grav, UA: 1.01 (ref 1.010–1.025)
Urobilinogen, UA: 0.2 E.U./dL
pH, UA: 6.5 (ref 5.0–8.0)

## 2018-02-01 ENCOUNTER — Other Ambulatory Visit: Payer: Self-pay | Admitting: Internal Medicine

## 2018-02-01 MED FILL — LEVOTHYROXINE 50 MCG TABLET: 50 | 90 days supply | Qty: 90 | Fill #4

## 2018-02-02 MED FILL — BUTALBITAL/APAP/CAFFEINE TA: 50-325-40 | 10 days supply | Qty: 60 | Fill #0

## 2018-02-02 MED FILL — MUPIROCIN 2% OINTMENT: 2 | 15 days supply | Qty: 22 | Fill #0

## 2018-02-05 ENCOUNTER — Encounter: Payer: Self-pay | Admitting: Internal Medicine
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
## 2018-02-05 NOTE — Patient Instructions (Signed)
It was a pleasure to see you today.  Continue same medications and return in 1 year or as needed. 

## 2018-02-15 DIAGNOSIS — E049 Nontoxic goiter, unspecified: Secondary | ICD-10-CM | POA: Diagnosis not present

## 2018-02-22 ENCOUNTER — Other Ambulatory Visit (HOSPITAL_BASED_OUTPATIENT_CLINIC_OR_DEPARTMENT_OTHER): Payer: Self-pay | Admitting: Endocrinology

## 2018-02-22 DIAGNOSIS — E049 Nontoxic goiter, unspecified: Secondary | ICD-10-CM

## 2018-02-28 ENCOUNTER — Ambulatory Visit (HOSPITAL_BASED_OUTPATIENT_CLINIC_OR_DEPARTMENT_OTHER)
Admission: RE | Admit: 2018-02-28 | Discharge: 2018-02-28 | Disposition: A | Discharge status: Home / Self Care | Payer: 59 | Source: Ambulatory Visit | Attending: Endocrinology | Admitting: Endocrinology

## 2018-02-28 DIAGNOSIS — E049 Nontoxic goiter, unspecified: Secondary | ICD-10-CM | POA: Diagnosis not present

## 2018-02-28 DIAGNOSIS — E042 Nontoxic multinodular goiter: Secondary | ICD-10-CM | POA: Diagnosis not present

## 2018-02-28 MED FILL — ESTRADIOL 0.1 MG/GM CREA: 0.1 | 90 days supply | Qty: 43 | Fill #0

## 2018-03-04 MED FILL — HEMMOREX-HC 25 MG SUPP: 25 | 6 days supply | Qty: 12 | Fill #1

## 2018-03-07 MED FILL — MUPIROCIN 2% OINTMENT: 2 | 15 days supply | Qty: 22 | Fill #1

## 2018-03-25 ENCOUNTER — Encounter: Payer: Self-pay | Admitting: Internal Medicine

## 2018-03-25 ENCOUNTER — Ambulatory Visit: Payer: 59 | Admitting: Internal Medicine

## 2018-03-25 VITALS — BP 130/100 | HR 75 | Temp 98.2°F | Ht 68.0 in | Wt 190.0 lb

## 2018-03-25 DIAGNOSIS — R030 Elevated blood-pressure reading, without diagnosis of hypertension: Secondary | ICD-10-CM | POA: Diagnosis not present

## 2018-03-25 MED ORDER — AMLODIPINE BESYLATE 2.5 MG PO TABS: 2.5000 mg | ORAL_TABLET | Freq: Every day | ORAL | 3 refills | Status: DC

## 2018-03-25 MED FILL — AMLODIPINE 2.5 MG TABLET: 2.5 | 90 days supply | Qty: 90 | Fill #0

## 2018-03-25 NOTE — Patient Instructions (Signed)
If BP persistently elevated may try amlodipine 2.5 mg daily

## 2018-03-25 NOTE — Progress Notes (Signed)
   Subjective:    Patient ID: Erin [name], female    DOB: 1966/02/13, 53 y.o.   MRN: [id number]  HPI 53 year old Female with Family Hx HTN in both parents in to discuss fluctuations in BP recently. Has been dieting and exercising.  BP in November at CPE was 110/82. Weight was 194 pounds.  Hx PIH which was treated for brief period of time and resolved.  Says BP has been 140/90,130/80,130/90.112/84.  Both parents take amlodipine.  Review of Systems no increased headaches or visual disturbances- does have hx migraine headaches     Objective:   Physical Exam Weight 190 pounds. BP 130/100. Rechecked in each arm and was 130/90 with regular cuff. Pulse 75 and regular.       Assessment & Plan:  Pt is concerned about fluctuating BP readings. She will continue to monitor and in persistently elevated will try Norvasc 2.5 mg daily. Has followup here in May or sooner if needed.

## 2018-05-09 MED FILL — LEVOTHYROXINE 50 MCG TABLET: 50 | 90 days supply | Qty: 90 | Fill #0 | Status: TO

## 2018-05-19 MED FILL — HEMMOREX-HC 25 MG SUPP: 25 | 6 days supply | Qty: 12 | Fill #2

## 2018-05-24 MED ORDER — BENZONATATE 100 MG PO CAPS: 100.0000 mg | ORAL_CAPSULE | Freq: Three times a day (TID) | ORAL | 2 refills | Status: DC | PRN

## 2018-05-24 MED FILL — BENZONATATE 100 MG CAP: 100 | 10 days supply | Qty: 30 | Fill #0

## 2018-05-31 ENCOUNTER — Telehealth: Payer: Self-pay | Admitting: Internal Medicine

## 2018-05-31 NOTE — Telephone Encounter (Addendum)
Cancel appt for today. Spoke with pt by phone had temporal headache on Sunday and jaw soreness. Ear hurt but Tylenol helped as did butalibital. Better today. No fever. May have had ? Migraine and ? TMJ symptoms. Continue to monitor symptoms.

## 2018-05-31 NOTE — Telephone Encounter (Signed)
Maeve Debord [phone]  Abrar called to say that on Sunday she started having some pain in her Jaw and ear area, she took some Tylenol. Last night she put cold compress on it to fall asleep. Her tempeture was 98.6 yesterday and 98.5 today. She did state she had cold last week.

## 2018-06-01 ENCOUNTER — Encounter: Payer: Self-pay | Admitting: Internal Medicine

## 2018-06-01 ENCOUNTER — Ambulatory Visit: Payer: 59 | Admitting: Internal Medicine

## 2018-06-18 MED FILL — AMLODIPINE 2.5 MG TABLET: 2.5 | 90 days supply | Qty: 90 | Fill #0

## 2018-07-20 MED FILL — LEVOTHYROXINE 50 MCG TABLET: 50 | 90 days supply | Qty: 90 | Fill #0

## 2018-07-25 ENCOUNTER — Other Ambulatory Visit: Payer: 59 | Admitting: Internal Medicine

## 2018-07-25 ENCOUNTER — Other Ambulatory Visit: Payer: Self-pay

## 2018-07-25 VITALS — BP 120/80 | HR 82

## 2018-07-25 DIAGNOSIS — E559 Vitamin D deficiency, unspecified: Secondary | ICD-10-CM | POA: Diagnosis not present

## 2018-07-25 DIAGNOSIS — E7800 Pure hypercholesterolemia, unspecified: Secondary | ICD-10-CM

## 2018-07-26 ENCOUNTER — Other Ambulatory Visit: Payer: 59 | Admitting: Internal Medicine

## 2018-07-26 LAB — LIPID PANEL
Cholesterol: 197 mg/dL (ref <200)
HDL: 52 mg/dL (ref >=50)
LDL Cholesterol (Calc): 125 mg/dL (calc) — ABNORMAL HIGH
Non-HDL Cholesterol (Calc): 145 mg/dL (calc) — ABNORMAL HIGH (ref <130)
Total CHOL/HDL Ratio: 3.8 (calc) (ref <5.0)
Triglycerides: 96 mg/dL (ref <150)

## 2018-07-26 LAB — VITAMIN D 25 HYDROXY (VIT D DEFICIENCY, FRACTURES): Vit D, 25-Hydroxy: 26 ng/mL — ABNORMAL LOW (ref 30–100)

## 2018-07-28 ENCOUNTER — Ambulatory Visit (INDEPENDENT_AMBULATORY_CARE_PROVIDER_SITE_OTHER): Payer: 59 | Admitting: Internal Medicine

## 2018-07-28 DIAGNOSIS — I10 Essential (primary) hypertension: Secondary | ICD-10-CM | POA: Diagnosis not present

## 2018-07-28 DIAGNOSIS — D708 Other neutropenia: Secondary | ICD-10-CM | POA: Diagnosis not present

## 2018-07-28 DIAGNOSIS — E559 Vitamin D deficiency, unspecified: Secondary | ICD-10-CM

## 2018-07-28 DIAGNOSIS — E7800 Pure hypercholesterolemia, unspecified: Secondary | ICD-10-CM | POA: Diagnosis not present

## 2018-07-28 MED ORDER — ERGOCALCIFEROL 1.25 MG (50000 UT) PO CAPS: 50000.0000 [IU] | ORAL_CAPSULE | ORAL | 1 refills | Status: DC

## 2018-07-28 MED ORDER — ROSUVASTATIN CALCIUM 5 MG PO TABS: ORAL_TABLET | ORAL | 3 refills | Status: DC

## 2018-07-28 MED FILL — ROSUVASTATIN CALCIUM 5 MG T: 5 | 84 days supply | Qty: 36 | Fill #0

## 2018-07-28 MED FILL — VIT D2 1.25 MG (50,000 UNIT: 1.25 MG | 84 days supply | Qty: 12 | Fill #0

## 2018-07-28 NOTE — Progress Notes (Signed)
   Subjective:    Patient ID: Erin Erin [name], female    DOB: 1965-12-30, 53 y.o.   MRN: [id number]  HPI 53 year old Female seen today for follow-up on hyperlipidemia and vitamin D deficiency.  In November her vitamin D level was low at 27.  She has been on vitamin D supplementation since that time.  She has hypertension and that is stable at present time on medication.  LDL was 120 in November and is now 125.  This worries her due to her family history.  We have agreed she will start on Crestor.  Repeat vitamin D level is low at 26.  We are prescribing high-dose vitamin D for her 50,000 units weekly..  Having Crestor 5 mg 3 times weekly.  She can follow-up with [place] in August regarding lipid panel liver functions and vitamin D level.  Her physical exam which is done annually is due in November.  She is seen today by interactive audio [employer] video telecommunications.  She gives consent to visit in this format today.  She is identified using 2 identifiers as Erin Erin [name]. Erin [name], a longstanding patient in this practice.  Patient continues to work as an Therapist, sports at [employer].  Recently cardiac rehab is been closed and she has had other duties.  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    Review of Systems no new complaints     Objective:   Physical Exam Spent 20 minutes speaking with her about these issues.  She is worried about risk of coronary artery disease with elevated LDL and family history.  Says her blood pressure has been under good control recently.       Assessment & Plan:  Elevated LDL cholesterol-we will start Crestor 5 mg 3 times weekly  Essential hypertension-stable on current regimen  Vitamin D deficiency-we will start high-dose vitamin D and will follow-up in August.  In August she will have vitamin D level drawn and fasting lipid panel and liver functions drawn.  Her annual physical exam is due in November.  20 minutes spent with patient today on virtual visit.

## 2018-07-28 NOTE — Patient Instructions (Signed)
Start Crestor 5 mg 3 times a week.  Start high-dose vitamin D 50,000 units weekly.  Recheck lipid panel liver functions and vitamin D level without office visit in 3 months.  Physical exam due in November.

## 2018-08-12 ENCOUNTER — Telehealth: Payer: Self-pay

## 2018-08-12 NOTE — Telephone Encounter (Signed)
Patient called states she was having a ST and she went to get tested for COVID 19 and it was negative, but now she is having postnasal drip and she would like to know what you recommend she takes to get rid of this?  Call back: [phone]

## 2018-08-12 NOTE — Telephone Encounter (Signed)
Phone call to pt. May try Zyrtec for post nasal drip and Flonase nasal spray.

## 2018-08-13 ENCOUNTER — Other Ambulatory Visit: Payer: Self-pay

## 2018-08-13 ENCOUNTER — Ambulatory Visit: Payer: Self-pay | Admitting: Nurse Practitioner

## 2018-08-13 VITALS — BP 125/88 | HR 95 | Temp 99.1°F | Resp 18 | Wt 190.0 lb

## 2018-08-13 DIAGNOSIS — J358 Other chronic diseases of tonsils and adenoids: Secondary | ICD-10-CM

## 2018-08-13 MED ORDER — LIDOCAINE VISCOUS HCL 2 % MT SOLN: 5.0000 mL | Freq: Four times a day (QID) | OROMUCOSAL | 0 refills | Status: AC | PRN

## 2018-08-13 MED FILL — LIDOCAINE 2% VISCOUS SOLN: 2 | 5 days supply | Qty: 100 | Fill #0

## 2018-08-13 NOTE — Progress Notes (Addendum)
MRN: [id number] DOB: 1965/11/04  Subjective:   Erin [name] is a 53 y.o. female presenting for chief complaint of Sore Throat (3 days, red spot) .  Reports 3 day history of sore throat.  The patient has a history of tonsil stones and informed she "removed one from the right tonsil this morning" pta in our office. Patient informs stone was yellowish-brown and "smelled awful".  Now patient has an area on the right tonsil that is "irritated" post-stone removal. Has tried drinking warm liquids, saltwater gargles for relief. Patient denies throat pain at this time, admits to "scratchiness"  Informs pain has improved since the removal of the stone earlier today.  Denies fever, subjective fever, sinus headache, sinus congestion , sinus pain, rhinorrhea, itchy watery eyes, red eyes, ear fullness, ear drainage, difficulty swallowing, inability to swallow, voice change, dry cough, productive cough, wheezing, shortness of breath, chest tightness, chest pain and myalgia, chills, fatigue, malaise, decreased appetite, weight loss, nausea, vomiting, abdominal pain and diarrhea.  Denies exposure to any individual with strep or mono.  Has had sick contact with COVID-19.  Patient was tested and tested negative over the past 3-5 days. Admits to a history of seasonal allergies, denies history of asthma. Denies smoking. Patient denies history of recurrent strep throat. Denies recent travel.   No PMH of mono, DM.  Denies smoking.  Denies recent oral sex exposure.  Denies any other aggravating or relieving factors, no other questions or concerns.  Review of Systems  Constitutional: Negative.   HENT: Positive for sore throat.   Eyes: Negative.   Respiratory: Negative.   Cardiovascular: Negative.   Skin: Negative.   Neurological: Negative.   Endo/Heme/Allergies: Positive for environmental allergies.    Zaidy has a current medication list which includes the following prescription(s): amlodipine, anucort-hc,  butalbital-acetaminophen-caffeine, cholecalciferol, diphenhydramine, epinephrine, ergocalciferol, estradiol, fluticasone, levothyroxine, melatonin, proctosol hc, rosuvastatin, ventolin hfa, and benzonatate. Also is allergic to aspirin; nsaids; and shellfish allergy.  Dreana  has a past medical history of Allergy, Anemia, Asthma, Fibrocystic breast disease, Hemorrhoids, and Thyroid disease. Also  has a past surgical history that includes Tubal ligation; Breast surgery; Wisdom tooth extraction; Breast cyst excision (Left); and Breast excisional biopsy (Bilateral).   Objective:   Vitals: BP 125/88 (BP Location: Right Arm, Patient Position: Sitting, Cuff Size: Normal)    Pulse 95    Temp 99.1 F (37.3 C) (Oral)    Resp 18    Wt 190 lb (86.2 kg)    LMP 11/04/2015 (Exact Date)    SpO2 98%    BMI 28.89 kg/m   Physical Exam Vitals signs reviewed.  Constitutional:      General: She is not in acute distress. HENT:     Head: Normocephalic.     Right Ear: Tympanic membrane and ear canal normal.     Left Ear: Tympanic membrane and ear canal normal.     Nose: No congestion.     Mouth/Throat:     Lips: Pink.     Mouth: Mucous membranes are moist. No oral lesions.     Dentition: No gingival swelling or dental abscesses.     Tongue: No lesions.     Palate: No mass and lesions.     Pharynx: Uvula midline. Posterior oropharyngeal erythema ( Localized area of erythema to the right tonsil) present. No pharyngeal swelling, oropharyngeal exudate or uvula swelling.     Tonsils: No tonsillar exudate or tonsillar abscesses. 0 on the right. 0 on the left.  Comments: Localized area of erythema to the right tonsil.  No exudates or abscess present.  No cervical adenopathy with palpation bilaterally.  Uvula is midline.  Airway is patent, with no obvious risk for compromise. Eyes:     Conjunctiva/sclera: Conjunctivae normal.     Pupils: Pupils are equal, round, and reactive to light.  Neck:      Musculoskeletal: Normal range of motion.     Thyroid: No thyromegaly.  Cardiovascular:     Rate and Rhythm: Normal rate and regular rhythm.     Heart sounds: Normal heart sounds.  Pulmonary:     Effort: Pulmonary effort is normal. No respiratory distress.     Breath sounds: Normal breath sounds. No stridor. No wheezing, rhonchi or rales.  Abdominal:     General: Bowel sounds are normal.     Palpations: Abdomen is soft.     Tenderness: There is no abdominal tenderness.  Skin:    General: Skin is warm and dry.     Capillary Refill: Capillary refill takes less than 2 seconds.  Neurological:     General: No focal deficit present.     Mental Status: She is alert and oriented to person, place, and time.  Psychiatric:        Mood and Affect: Mood normal.        Behavior: Behavior normal.     No results found for this or any previous visit (from the past 24 hour(s)).  Centor Score Tonsillar Exudate: 0 Fever/Hx of fever >39 Degrees Celsius: 0 Tender anterior cervical lymphadenopathy: 0 Age > 45: -1 Total: -1  Assessment and Plan :   Exam findings, diagnosis etiology and medication use and indications reviewed with patient. Follow- Up and discharge instructions provided. No emergent/urgent issues found on exam.  I feel the patient's symptoms are largely due to the removal of the tonsil stone earlier today.  Has a small area of irritation or inflammation where the tonsil resided prior to the removal.  I think the removal of the stone did cause some mild trauma to the right tonsil.  The patient Centor score was -1, which is not indicative of the need for a rapid strep test.  The patient does not exhibit any symptoms of strep throat to include exudate, fever, or cervical lymphadenopathy.  Patient also does not display a hot potato voice, muffled speaking or risk for PTA.  Because the patient states her symptoms are improving, I will provide the patient with additional symptomatic relief to  include lidocaine mouthwash.  Patient was also instructed to continue the salt water gargles, along with drinking warm or cool fluids to help with discomfort.  The patient is well-appearing, is in no acute distress, and vital signs are stable.  Patient education was provided. Patient verbalized understanding of information provided and agrees with plan of care (POC), all questions answered. The patient is advised to call or return to clinic if condition does not see an improvement in symptoms, or to seek the care of the closest emergency department if condition worsens with the above plan.   1. Tonsil stone  - lidocaine (XYLOCAINE) 2 % solution; Use as directed 5 mLs in the mouth or throat every 6 (six) hours as needed for up to 5 days. Gargle and swallow.  Dispense: 100 mL; Refill: 0 -Take medication as prescribed. -Ibuprofen or Tylenol for pain, fever, or general discomfort. -Continue warm salt water gargles 3-4 times daily until symptoms improve. -May also use a teaspoon of  honey to help with throat discomfort. -Warm or cool liquids to help with throat pain or discomfort. -Increase fluids when eating.  Make sure to drink to ensure all foods are swallowed to discourage formation of future tonsil stones. -Follow-up with your PCP if symptoms recur or become recurrent. -Follow-up in our office as needed. -Work note provided to return to work on Monday, August 15, 2018.

## 2018-08-13 NOTE — Patient Instructions (Addendum)
Sore Throat -Take medication as prescribed. -Ibuprofen or Tylenol for pain, fever, or general discomfort. -Continue warm salt water gargles 3-4 times daily until symptoms improve. -May also use a teaspoon of honey to help with throat discomfort. -Warm or cool liquids to help with throat pain or discomfort. -Increase fluids when eating.  Make sure to drink to ensure all foods are swallowed to discourage formation of future tonsil stones. -Follow-up with your PCP if symptoms recur or become recurrent. -Follow-up in our office as needed. -Work note provided to return to work on Monday, August 15, 2018.   A sore throat is pain, burning, irritation, or scratchiness in the throat. When you have a sore throat, you may feel pain or tenderness in your throat when you swallow or talk. Many things can cause a sore throat, including:  An infection.  Seasonal allergies.  Dryness in the air.  Irritants, such as smoke or pollution.  Radiation treatment to the area.  Gastroesophageal reflux disease (GERD).  A tumor. A sore throat is often the first sign of another sickness. It may happen with other symptoms, such as coughing, sneezing, fever, and swollen neck glands. Most sore throats go away without medical treatment. Follow these instructions at home:      Take over-the-counter medicines only as told by your health care provider. ? If your child has a sore throat, do not give your child aspirin because of the association with Reye syndrome.  Drink enough fluids to keep your urine pale yellow.  Rest as needed.  To help with pain, try: ? Sipping warm liquids, such as broth, herbal tea, or warm water. ? Eating or drinking cold or frozen liquids, such as frozen ice pops. ? Gargling with a salt-water mixture 3-4 times a day or as needed. To make a salt-water mixture, completely dissolve -1 tsp (3-6 g) of salt in 1 cup (237 mL) of warm water. ? Sucking on hard candy or throat lozenges. ?  Putting a cool-mist humidifier in your bedroom at night to moisten the air. ? Sitting in the bathroom with the door closed for 5-10 minutes while you run hot water in the shower.  Do not use any products that contain nicotine or tobacco, such as cigarettes, e-cigarettes, and chewing tobacco. If you need help quitting, ask your health care provider.  Wash your hands well and often with soap and water. If soap and water are not available, use hand sanitizer. Contact a health care provider if:  You have a fever for more than 2-3 days.  You have symptoms that last (are persistent) for more than 2-3 days.  Your throat does not get better within 7 days.  You have a fever and your symptoms suddenly get worse.  Your child who is 3 months to 40 years old has a temperature of 102.29F (39C) or higher. Get help right away if:  You have difficulty breathing.  You cannot swallow fluids, soft foods, or your saliva.  You have increased swelling in your throat or neck.  You have persistent nausea and vomiting. Summary  A sore throat is pain, burning, irritation, or scratchiness in the throat. Many things can cause a sore throat.  Take over-the-counter medicines only as told by your health care provider. Do not give your child aspirin.  Drink plenty of fluids, and rest as needed.  Contact a health care provider if your symptoms worsen or your sore throat does not get better within 7 days. This information is not intended  to replace advice given to you by your health care provider. Make sure you discuss any questions you have with your health care provider. Document Released: 04/02/2004 Document Revised: 07/26/2017 Document Reviewed: 07/26/2017 Elsevier Interactive Patient Education  2019 [employer].

## 2018-08-16 ENCOUNTER — Telehealth: Payer: Self-pay

## 2018-08-16 NOTE — Telephone Encounter (Signed)
Patient states she is feeling better, she increased her water intake as the provider suggest her, she denies any problem at this moment.

## 2018-08-16 NOTE — Telephone Encounter (Signed)
Patient states she is feeling better and she increased her water intake.

## 2018-08-24 ENCOUNTER — Encounter: Payer: Self-pay | Admitting: Internal Medicine

## 2018-08-24 DIAGNOSIS — D708 Other neutropenia: Secondary | ICD-10-CM | POA: Insufficient documentation

## 2018-09-20 MED FILL — AMLODIPINE 2.5 MG TABLET: 2.5 | 90 days supply | Qty: 90 | Fill #0

## 2018-10-17 MED FILL — VIT D2 1.25 MG (50,000 UNIT: 1.25 MG | 84 days supply | Qty: 12 | Fill #0

## 2018-10-17 MED FILL — LEVOTHYROXINE 50 MCG TABLET: 50 | 90 days supply | Qty: 90 | Fill #0

## 2018-10-17 MED FILL — VIT D2 1.25 MG (50,000 UNIT: 1.25 MG | 84 days supply | Qty: 12 | Fill #1

## 2018-10-17 MED FILL — ROSUVASTATIN CALCIUM 5 MG T: 5 | 84 days supply | Qty: 36 | Fill #1

## 2018-10-18 ENCOUNTER — Other Ambulatory Visit: Payer: 59 | Admitting: Internal Medicine

## 2018-10-18 ENCOUNTER — Other Ambulatory Visit: Payer: Self-pay

## 2018-10-18 DIAGNOSIS — E7800 Pure hypercholesterolemia, unspecified: Secondary | ICD-10-CM

## 2018-10-18 DIAGNOSIS — E559 Vitamin D deficiency, unspecified: Secondary | ICD-10-CM

## 2018-10-18 DIAGNOSIS — Z79899 Other long term (current) drug therapy: Secondary | ICD-10-CM

## 2018-10-19 LAB — HEPATIC FUNCTION PANEL
AG Ratio: 1.6 (calc) (ref 1.0–2.5)
ALT: 9 U/L (ref 6–29)
AST: 14 U/L (ref 10–35)
Albumin: 4.2 g/dL (ref 3.6–5.1)
Alkaline phosphatase (APISO): 49 U/L (ref 37–153)
Bilirubin, Direct: 0.1 mg/dL (ref 0.0–0.2)
Globulin: 2.7 g/dL (calc) (ref 1.9–3.7)
Indirect Bilirubin: 0.4 mg/dL (calc) (ref 0.2–1.2)
Total Bilirubin: 0.5 mg/dL (ref 0.2–1.2)
Total Protein: 6.9 g/dL (ref 6.1–8.1)

## 2018-10-19 LAB — LIPID PANEL
Cholesterol: 168 mg/dL (ref <200)
HDL: 55 mg/dL (ref >=50)
LDL Cholesterol (Calc): 99 mg/dL (calc)
Non-HDL Cholesterol (Calc): 113 mg/dL (calc) (ref <130)
Total CHOL/HDL Ratio: 3.1 (calc) (ref <5.0)
Triglycerides: 46 mg/dL (ref <150)

## 2018-10-19 LAB — VITAMIN D 25 HYDROXY (VIT D DEFICIENCY, FRACTURES): Vit D, 25-Hydroxy: 49 ng/mL (ref 30–100)

## 2018-11-07 ENCOUNTER — Other Ambulatory Visit (HOSPITAL_BASED_OUTPATIENT_CLINIC_OR_DEPARTMENT_OTHER): Payer: Self-pay | Admitting: Obstetrics and Gynecology

## 2018-11-07 DIAGNOSIS — Z1239 Encounter for other screening for malignant neoplasm of breast: Secondary | ICD-10-CM

## 2018-11-17 ENCOUNTER — Ambulatory Visit: Payer: 59 | Admitting: Podiatry

## 2018-12-01 ENCOUNTER — Ambulatory Visit: Payer: 59 | Admitting: Podiatry

## 2018-12-01 ENCOUNTER — Other Ambulatory Visit: Payer: Self-pay

## 2018-12-01 ENCOUNTER — Ambulatory Visit (INDEPENDENT_AMBULATORY_CARE_PROVIDER_SITE_OTHER): Payer: 59 | Admitting: Podiatry

## 2018-12-01 ENCOUNTER — Ambulatory Visit (INDEPENDENT_AMBULATORY_CARE_PROVIDER_SITE_OTHER): Payer: 59

## 2018-12-01 ENCOUNTER — Encounter: Payer: Self-pay | Admitting: Podiatry

## 2018-12-01 VITALS — BP 126/77 | HR 82 | Resp 16

## 2018-12-01 DIAGNOSIS — M722 Plantar fascial fibromatosis: Secondary | ICD-10-CM

## 2018-12-01 MED ORDER — METHYLPREDNISOLONE 4 MG PO TBPK: ORAL_TABLET | ORAL | 0 refills | Status: DC

## 2018-12-01 MED FILL — METHYLPREDNISOLONE 4 MG TBP: 4 | 6 days supply | Qty: 21 | Fill #0

## 2018-12-01 NOTE — Patient Instructions (Signed)

## 2018-12-01 NOTE — Progress Notes (Signed)
Subjective:  Patient ID: Erin [name], female    DOB: [birth year],  MRN: [id number] HPI Chief Complaint  Patient presents with  . Foot Pain    Plantar heel right - aching x few months, AM pain, tried Tylenol and hemp oil  . New Patient (Initial Visit)    Est pt 2016    53 y.o. female presents with the above complaint.   ROS: Denies fever chills nausea vomiting muscle aches pains calf pain back pain chest pain shortness of breath headache.  Past Medical History:  Diagnosis Date  . Allergy   . Anemia    no meds  . Asthma    uses inhaler prn  . Fibrocystic breast disease   . Hemorrhoids   . Thyroid disease    Past Surgical History:  Procedure Laterality Date  . BREAST CYST EXCISION Left   . BREAST EXCISIONAL BIOPSY Bilateral   . BREAST SURGERY     fibrocystic breast/Bil  . TUBAL LIGATION    . WISDOM TOOTH EXTRACTION      Current Outpatient Medications:  .  amLODipine (NORVASC) 2.5 MG tablet, Take 1 tablet (2.5 mg total) by mouth daily., Disp: 90 tablet, Rfl: 3 .  Cholecalciferol (VITAMIN D-3 PO), Take 1 tablet by mouth. Vit d 3 1000 units gummies-Take 2 daily, Disp: , Rfl:  .  diphenhydrAMINE (BENADRYL) 50 MG capsule, Take 50 mg by mouth every 6 (six) hours as needed. For sleep, Disp: , Rfl:  .  levothyroxine (SYNTHROID, LEVOTHROID) 50 MCG tablet, Take 50 mcg by mouth daily before breakfast., Disp: , Rfl:  .  Melatonin 5 MG TABS, Take 6 mg daily by mouth., Disp: , Rfl:  .  methylPREDNISolone (MEDROL DOSEPAK) 4 MG TBPK tablet, 6 day dose pack - take as directed, Disp: 21 tablet, Rfl: 0 .  rosuvastatin (CRESTOR) 5 MG tablet, One po 3 times a week, Disp: 36 tablet, Rfl: 3  Allergies  Allergen Reactions  . Aspirin     REACTION: Hives, Wheezing  . Nsaids     REACTION: Hives, Wheezing  . Shellfish Allergy Other (See Comments)    hives   Review of Systems Objective:   Vitals:   12/01/18 0834  BP: 126/77  Pulse: 82  Resp: 16    General: Well developed,  nourished, in no acute distress, alert and oriented x3   Dermatological: Skin is warm, dry and supple bilateral. Nails x 10 are well maintained; remaining integument appears unremarkable at this time. There are no open sores, no preulcerative lesions, no rash or signs of infection present.  Vascular: Dorsalis Pedis artery and Posterior Tibial artery pedal pulses are 2/4 bilateral with immedate capillary fill time. Pedal hair growth present. No varicosities and no lower extremity edema present bilateral.   Neruologic: Grossly intact via light touch bilateral. Vibratory intact via tuning fork bilateral. Protective threshold with Semmes Wienstein monofilament intact to all pedal sites bilateral. Patellar and Achilles deep tendon reflexes 2+ bilateral. No Babinski or clonus noted bilateral.   Musculoskeletal: No gross boney pedal deformities bilateral. No pain, crepitus, or limitation noted with foot and ankle range of motion bilateral. Muscular strength 5/5 in all groups tested bilateral.  Gait: Unassisted, Nonantalgic.    Radiographs:  Radiographs taken today demonstrate a plantar distally oriented calcaneal heel spur on the soft tissue increase in density plantar fashion calcaneal insertion site.  Otherwise osseously mature individual is identified with no acute findings.  Assessment & Plan:   Assessment: Plantar fasciitis right foot.  Plan: Discussed etiology pathology conservative versus surgical therapies.  After sterile Betadine skin prep I injected 20 mg Kenalog 5 mg Marcaine point of maximal tenderness of her right heel.  Tolerated procedure well.  Start her on a Medrol Dosepak no NSAIDs because of allergy plantar fascial brace and a night splint which she already has at home.  Discussed appropriate shoe gear stretching exercise ice therapy sugar modifications.  Follow-up with her in 1 month     [place] Freyre T. [place], [place]

## 2018-12-08 ENCOUNTER — Ambulatory Visit: Payer: 59 | Admitting: Podiatry

## 2018-12-26 ENCOUNTER — Other Ambulatory Visit: Payer: Self-pay | Admitting: Internal Medicine

## 2018-12-26 MED FILL — AMLODIPINE 2.5 MG TABLET: 2.5 | 90 days supply | Qty: 90 | Fill #1

## 2018-12-26 MED FILL — VIT D2 1.25 MG (50,000 UNIT: 1.25 MG | 84 days supply | Qty: 12 | Fill #0

## 2019-01-02 MED FILL — ROSUVASTATIN CALCIUM 5 MG T: 5 | 84 days supply | Qty: 36 | Fill #2

## 2019-01-03 ENCOUNTER — Ambulatory Visit (HOSPITAL_BASED_OUTPATIENT_CLINIC_OR_DEPARTMENT_OTHER)
Admission: RE | Admit: 2019-01-03 | Discharge: 2019-01-03 | Disposition: A | Discharge status: Home / Self Care | Payer: 59 | Source: Ambulatory Visit | Attending: Obstetrics and Gynecology | Admitting: Obstetrics and Gynecology

## 2019-01-03 ENCOUNTER — Encounter (HOSPITAL_BASED_OUTPATIENT_CLINIC_OR_DEPARTMENT_OTHER): Payer: Self-pay

## 2019-01-03 ENCOUNTER — Other Ambulatory Visit: Payer: Self-pay

## 2019-01-03 DIAGNOSIS — Z1231 Encounter for screening mammogram for malignant neoplasm of breast: Secondary | ICD-10-CM | POA: Insufficient documentation

## 2019-01-03 DIAGNOSIS — Z1239 Encounter for other screening for malignant neoplasm of breast: Secondary | ICD-10-CM

## 2019-01-03 DIAGNOSIS — H524 Presbyopia: Secondary | ICD-10-CM | POA: Diagnosis not present

## 2019-01-03 DIAGNOSIS — H5213 Myopia, bilateral: Secondary | ICD-10-CM | POA: Diagnosis not present

## 2019-01-03 DIAGNOSIS — H52223 Regular astigmatism, bilateral: Secondary | ICD-10-CM | POA: Diagnosis not present

## 2019-01-05 DIAGNOSIS — Z01419 Encounter for gynecological examination (general) (routine) without abnormal findings: Secondary | ICD-10-CM | POA: Diagnosis not present

## 2019-01-05 DIAGNOSIS — Z6828 Body mass index (BMI) 28.0-28.9, adult: Secondary | ICD-10-CM | POA: Diagnosis not present

## 2019-01-17 ENCOUNTER — Ambulatory Visit: Payer: 59 | Admitting: Podiatry

## 2019-01-19 ENCOUNTER — Other Ambulatory Visit: Payer: 59 | Admitting: Internal Medicine

## 2019-01-19 ENCOUNTER — Other Ambulatory Visit: Payer: Self-pay

## 2019-01-19 DIAGNOSIS — E049 Nontoxic goiter, unspecified: Secondary | ICD-10-CM | POA: Diagnosis not present

## 2019-01-19 DIAGNOSIS — E7800 Pure hypercholesterolemia, unspecified: Secondary | ICD-10-CM | POA: Diagnosis not present

## 2019-01-19 DIAGNOSIS — Z1329 Encounter for screening for other suspected endocrine disorder: Secondary | ICD-10-CM

## 2019-01-19 DIAGNOSIS — D708 Other neutropenia: Secondary | ICD-10-CM

## 2019-01-19 DIAGNOSIS — J309 Allergic rhinitis, unspecified: Secondary | ICD-10-CM

## 2019-01-19 DIAGNOSIS — I10 Essential (primary) hypertension: Secondary | ICD-10-CM

## 2019-01-19 DIAGNOSIS — Z0000 Encounter for general adult medical examination without abnormal findings: Secondary | ICD-10-CM | POA: Diagnosis not present

## 2019-01-19 DIAGNOSIS — Z8639 Personal history of other endocrine, nutritional and metabolic disease: Secondary | ICD-10-CM | POA: Diagnosis not present

## 2019-01-19 DIAGNOSIS — M19049 Primary osteoarthritis, unspecified hand: Secondary | ICD-10-CM

## 2019-01-19 DIAGNOSIS — Z8669 Personal history of other diseases of the nervous system and sense organs: Secondary | ICD-10-CM

## 2019-01-19 DIAGNOSIS — Z1321 Encounter for screening for nutritional disorder: Secondary | ICD-10-CM

## 2019-01-19 MED FILL — MUPIROCIN 2% OINTMENT: 2 | 15 days supply | Qty: 22 | Fill #2

## 2019-01-19 MED FILL — LEVOTHYROXINE 50 MCG TABLET: 50 | 90 days supply | Qty: 90 | Fill #1

## 2019-01-20 LAB — COMPLETE METABOLIC PANEL WITH GFR
AG Ratio: 1.6 (calc) (ref 1.0–2.5)
ALT: 10 U/L (ref 6–29)
AST: 14 U/L (ref 10–35)
Albumin: 4.1 g/dL (ref 3.6–5.1)
Alkaline phosphatase (APISO): 49 U/L (ref 37–153)
BUN: 12 mg/dL (ref 7–25)
CO2: 25 mmol/L (ref 20–32)
Calcium: 9.3 mg/dL (ref 8.6–10.4)
Chloride: 106 mmol/L (ref 98–110)
Creat: 0.79 mg/dL (ref 0.50–1.05)
GFR, Est African American: 98 mL/min/{1.73_m2} (ref >=60)
GFR, Est Non African American: 85 mL/min/{1.73_m2} (ref >=60)
Globulin: 2.5 g/dL (calc) (ref 1.9–3.7)
Glucose, Bld: 91 mg/dL (ref 65–99)
Potassium: 3.9 mmol/L (ref 3.5–5.3)
Sodium: 141 mmol/L (ref 135–146)
Total Bilirubin: 0.4 mg/dL (ref 0.2–1.2)
Total Protein: 6.6 g/dL (ref 6.1–8.1)

## 2019-01-20 LAB — LIPID PANEL
Cholesterol: 160 mg/dL (ref <200)
HDL: 61 mg/dL (ref >=50)
LDL Cholesterol (Calc): 85 mg/dL (calc)
Non-HDL Cholesterol (Calc): 99 mg/dL (calc) (ref <130)
Total CHOL/HDL Ratio: 2.6 (calc) (ref <5.0)
Triglycerides: 49 mg/dL (ref <150)

## 2019-01-20 LAB — CBC WITH DIFFERENTIAL/PLATELET
Absolute Monocytes: 311 cells/uL (ref 200–950)
Basophils Absolute: 20 cells/uL (ref 0–200)
Basophils Relative: 0.7 %
Eosinophils Absolute: 39 cells/uL (ref 15–500)
Eosinophils Relative: 1.4 %
HCT: 35.4 % (ref 35.0–45.0)
Hemoglobin: 11.8 g/dL (ref 11.7–15.5)
Lymphs Abs: 1011 cells/uL (ref 850–3900)
MCH: 28.9 pg (ref 27.0–33.0)
MCHC: 33.3 g/dL (ref 32.0–36.0)
MCV: 86.6 fL (ref 80.0–100.0)
MPV: 10.2 fL (ref 7.5–12.5)
Monocytes Relative: 11.1 %
Neutro Abs: 1420 cells/uL — ABNORMAL LOW (ref 1500–7800)
Neutrophils Relative %: 50.7 %
Platelets: 174 10*3/uL (ref 140–400)
RBC: 4.09 10*6/uL (ref 3.80–5.10)
RDW: 12.5 % (ref 11.0–15.0)
Total Lymphocyte: 36.1 %
WBC: 2.8 10*3/uL — ABNORMAL LOW (ref 3.8–10.8)

## 2019-01-20 LAB — TSH: TSH: 0.61 mIU/L

## 2019-01-20 LAB — VITAMIN D 25 HYDROXY (VIT D DEFICIENCY, FRACTURES): Vit D, 25-Hydroxy: 82 ng/mL (ref 30–100)

## 2019-01-24 ENCOUNTER — Telehealth: Payer: Self-pay | Admitting: Internal Medicine

## 2019-01-24 ENCOUNTER — Ambulatory Visit (INDEPENDENT_AMBULATORY_CARE_PROVIDER_SITE_OTHER): Payer: 59 | Admitting: Internal Medicine

## 2019-01-24 ENCOUNTER — Other Ambulatory Visit: Payer: Self-pay

## 2019-01-24 ENCOUNTER — Encounter: Payer: Self-pay | Admitting: Internal Medicine

## 2019-01-24 VITALS — BP 100/80 | HR 77 | Ht 68.0 in | Wt 190.0 lb

## 2019-01-24 DIAGNOSIS — I10 Essential (primary) hypertension: Secondary | ICD-10-CM

## 2019-01-24 DIAGNOSIS — Z86018 Personal history of other benign neoplasm: Secondary | ICD-10-CM

## 2019-01-24 DIAGNOSIS — Z8669 Personal history of other diseases of the nervous system and sense organs: Secondary | ICD-10-CM | POA: Diagnosis not present

## 2019-01-24 DIAGNOSIS — E7800 Pure hypercholesterolemia, unspecified: Secondary | ICD-10-CM | POA: Diagnosis not present

## 2019-01-24 DIAGNOSIS — D708 Other neutropenia: Secondary | ICD-10-CM | POA: Diagnosis not present

## 2019-01-24 DIAGNOSIS — J309 Allergic rhinitis, unspecified: Secondary | ICD-10-CM

## 2019-01-24 DIAGNOSIS — Z0000 Encounter for general adult medical examination without abnormal findings: Secondary | ICD-10-CM | POA: Diagnosis not present

## 2019-01-24 DIAGNOSIS — E049 Nontoxic goiter, unspecified: Secondary | ICD-10-CM

## 2019-01-24 DIAGNOSIS — K648 Other hemorrhoids: Secondary | ICD-10-CM | POA: Diagnosis not present

## 2019-01-24 LAB — POCT URINALYSIS DIPSTICK
Appearance: NEGATIVE
Bilirubin, UA: NEGATIVE
Blood, UA: NEGATIVE
Glucose, UA: NEGATIVE
Ketones, UA: NEGATIVE
Leukocytes, UA: NEGATIVE
Nitrite, UA: NEGATIVE
Odor: NEGATIVE
Protein, UA: NEGATIVE
Spec Grav, UA: 1.015 (ref 1.010–1.025)
Urobilinogen, UA: 0.2 E.U./dL
pH, UA: 7.5 (ref 5.0–8.0)

## 2019-01-24 MED ORDER — EPINEPHRINE 0.3 MG/0.3ML IJ SOAJ: 0.3000 mg | INTRAMUSCULAR | 0 refills | Status: DC | PRN

## 2019-01-24 MED FILL — EPINEPHRINE 0.3 MG AUTO-INJ: 0.3 | 2 days supply | Qty: 2 | Fill #0
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
## 2019-01-24 NOTE — Telephone Encounter (Signed)
Erin [name] would like to know if she can give blood twice a year instead of once a year.

## 2019-01-24 NOTE — Telephone Encounter (Signed)
No more than twice a year and no double donations at one time

## 2019-01-30 ENCOUNTER — Telehealth: Payer: Self-pay | Admitting: Internal Medicine

## 2019-01-30 NOTE — Telephone Encounter (Signed)
Erin [name] [phone]  Elyzah called to say that her father had passed away and she is in [place] and did not take her medication. She wanted to see if we could call in Crestor and her thyroid medication. She will be back on Wednesday.

## 2019-01-30 NOTE — Telephone Encounter (Addendum)
Called patient back after speaking with Dr Renold Genta and she said patient would be okay without these 2 medications until she arrives back home on Wednesday. Patient verbalized understanding.

## 2019-02-09 ENCOUNTER — Ambulatory Visit (INDEPENDENT_AMBULATORY_CARE_PROVIDER_SITE_OTHER): Payer: 59 | Admitting: Podiatry

## 2019-02-09 ENCOUNTER — Other Ambulatory Visit: Payer: Self-pay

## 2019-02-09 DIAGNOSIS — M722 Plantar fascial fibromatosis: Secondary | ICD-10-CM

## 2019-02-11 NOTE — Progress Notes (Signed)
She presents today for follow-up of plantar fasciitis to her right foot.  States that is been a little better I still have pain sometimes most of the pain is at nighttime after been on my foot all day.  She still tries to use a plantar fascial brace and a night splint and icing stretching states that the shoe has helped some  Objective: Vital signs are stable alert oriented x3.  Pulses are palpable.  Still has pain on palpation medial calcaneal tubercle of the right heel.  Assessment: Plantar fasciitis resolving.  Plan: Encouraged her to continue use of all of the conservative therapies and I reinjected the heel today 20 mg Kenalog 5 mg Marcaine point of maximal tenderness for follow-up with her in about 6 weeks if she is doing better she can cancel.

## 2019-02-13 ENCOUNTER — Other Ambulatory Visit: Payer: Self-pay | Admitting: Internal Medicine

## 2019-02-13 MED FILL — BUTALBITAL-APAP-CAFFEINE 50: 50-325-40 | 10 days supply | Qty: 60 | Fill #0

## 2019-02-21 DIAGNOSIS — E049 Nontoxic goiter, unspecified: Secondary | ICD-10-CM | POA: Diagnosis not present

## 2019-02-23 ENCOUNTER — Other Ambulatory Visit: Payer: Self-pay

## 2019-02-23 ENCOUNTER — Telehealth: Payer: Self-pay | Admitting: Internal Medicine

## 2019-02-23 ENCOUNTER — Ambulatory Visit (INDEPENDENT_AMBULATORY_CARE_PROVIDER_SITE_OTHER): Payer: 59 | Admitting: Internal Medicine

## 2019-02-23 ENCOUNTER — Encounter: Payer: Self-pay | Admitting: Internal Medicine

## 2019-02-23 VITALS — BP 120/80 | HR 74 | Ht 68.0 in

## 2019-02-23 DIAGNOSIS — E039 Hypothyroidism, unspecified: Secondary | ICD-10-CM | POA: Diagnosis not present

## 2019-02-23 DIAGNOSIS — R2232 Localized swelling, mass and lump, left upper limb: Secondary | ICD-10-CM

## 2019-02-23 DIAGNOSIS — E049 Nontoxic goiter, unspecified: Secondary | ICD-10-CM | POA: Diagnosis not present

## 2019-02-23 NOTE — Telephone Encounter (Signed)
Erin [name] [phone]  Soren called to say she has a cyst or something that has come up on her left pinky finger and would like for you to look at it. Would like to come in this afternoon.

## 2019-02-23 NOTE — Progress Notes (Signed)
   Subjective:    Patient ID: Erin [name], female    DOB: 13-Jul-1965, 53 y.o.   MRN: [id number]  HPI Patient called regarding left 5th finger nodule that is tender and just developed over past few days. No known trauma. No drainage from lesion. Says that it is tender to touch and appeared rather suddenly.    Review of Systems see above     Objective:   Physical Exam Afebrile, BP 120/80, Weight  192 pounds  Volar aspect left fifth finger DIP joint there is a small tender nodule with bluish discoloration. Full ROM PIP joint. No surrounding erythema.      Assessment & Plan:  Probable cysti left fifth PIP joint. May try soaking in warm water. If persists will refer to hand surgeon.

## 2019-02-23 NOTE — Telephone Encounter (Signed)
scheduled

## 2019-02-23 NOTE — Telephone Encounter (Signed)
Can see today

## 2019-02-23 NOTE — Patient Instructions (Signed)
May soak left fifth finger in warm water 20 minutes daily. If cyst persits can refer to hand surgeon.

## 2019-02-24 ENCOUNTER — Other Ambulatory Visit (HOSPITAL_BASED_OUTPATIENT_CLINIC_OR_DEPARTMENT_OTHER): Payer: Self-pay | Admitting: Endocrinology

## 2019-02-24 DIAGNOSIS — E049 Nontoxic goiter, unspecified: Secondary | ICD-10-CM

## 2019-02-27 ENCOUNTER — Telehealth: Payer: Self-pay

## 2019-02-27 NOTE — Telephone Encounter (Signed)
Called and schedule patient to see Dr Daryll Brod at [facility] [phone] 03/06/19 at 9:30 to arrive 9:15. Called patient to let her know and she is going to need to reschedule. Gave patient phone number to call and reschedule when is convenient for her.

## 2019-02-27 NOTE — Telephone Encounter (Signed)
Patient called the cyst on her finger is not getting any better, she would like to go see the hand specialist and she wants to go ASAP bc she has met her deductible.

## 2019-02-27 NOTE — Telephone Encounter (Signed)
Referral sent 

## 2019-02-27 NOTE — Telephone Encounter (Signed)
Please refer to Dr. 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

## 2019-02-28 ENCOUNTER — Other Ambulatory Visit: Payer: Self-pay

## 2019-02-28 ENCOUNTER — Telehealth: Payer: Self-pay | Admitting: Internal Medicine

## 2019-02-28 ENCOUNTER — Ambulatory Visit (HOSPITAL_BASED_OUTPATIENT_CLINIC_OR_DEPARTMENT_OTHER)
Admission: RE | Admit: 2019-02-28 | Discharge: 2019-02-28 | Disposition: A | Discharge status: Home / Self Care | Payer: 59 | Source: Ambulatory Visit | Attending: Endocrinology | Admitting: Endocrinology

## 2019-02-28 DIAGNOSIS — E049 Nontoxic goiter, unspecified: Secondary | ICD-10-CM | POA: Insufficient documentation

## 2019-02-28 NOTE — Telephone Encounter (Signed)
I think it will be OK

## 2019-02-28 NOTE — Telephone Encounter (Signed)
Called patient, to let her know what Dr Lauree Chandler response was, she verbalized understanding.

## 2019-02-28 NOTE — Telephone Encounter (Signed)
Erin [name] [phone]  [name] called to see if you think it would be okay for her to get the Vaccine in the morning and go to Hand doctor in the afternoon. She stated she would take her Epen with her, just incase.

## 2019-03-01 DIAGNOSIS — R2231 Localized swelling, mass and lump, right upper limb: Secondary | ICD-10-CM | POA: Insufficient documentation

## 2019-03-01 DIAGNOSIS — M19042 Primary osteoarthritis, left hand: Secondary | ICD-10-CM | POA: Diagnosis not present

## 2019-03-05 ENCOUNTER — Encounter: Payer: Self-pay | Admitting: Internal Medicine

## 2019-03-05 ENCOUNTER — Telehealth: Payer: Self-pay | Admitting: Internal Medicine

## 2019-03-05 NOTE — Telephone Encounter (Signed)
Patient called answering service regarding finger lesion seen recently by Dr. Fredna Dow. An ultrasound was ordered in the near future to see if lesion is cystic or solid. Patient reports finger lesion is much more tender and she is concerned. Feels it has changed. Advised patient to contact hand surgeon on call for advice.

## 2019-03-06 ENCOUNTER — Other Ambulatory Visit: Payer: Self-pay | Admitting: Orthopedic Surgery

## 2019-03-06 DIAGNOSIS — R2231 Localized swelling, mass and lump, right upper limb: Secondary | ICD-10-CM

## 2019-03-07 ENCOUNTER — Other Ambulatory Visit: Payer: Self-pay | Admitting: Internal Medicine

## 2019-03-07 MED FILL — MUPIROCIN 2% OINTMENT: 2 | 15 days supply | Qty: 22 | Fill #0

## 2019-03-07 MED FILL — HYDROCORTISONE AC 25 MG SUP: 25 | 6 days supply | Qty: 12 | Fill #0

## 2019-03-07 MED FILL — ESTRADIOL 0.1 MG/GM CREA: 0.1 | 90 days supply | Qty: 43 | Fill #0

## 2019-03-07 MED FILL — FLUTICASONE PROP 50 MCG SPR: 50 | 90 days supply | Qty: 48 | Fill #0

## 2019-03-08 NOTE — Patient Instructions (Addendum)
It was a pleasure to see you today.  Lab work is within normal limits.  Continue current medications and follow-up in May 2021 or as needed.  Flu vaccine given through employment.  COVID-19 vaccine to be given in the near future.

## 2019-03-17 ENCOUNTER — Ambulatory Visit
Admission: RE | Admit: 2019-03-17 | Discharge: 2019-03-17 | Disposition: A | Discharge status: Home / Self Care | Payer: 59 | Source: Ambulatory Visit | Attending: Orthopedic Surgery | Admitting: Orthopedic Surgery

## 2019-03-17 DIAGNOSIS — R2231 Localized swelling, mass and lump, right upper limb: Secondary | ICD-10-CM | POA: Diagnosis not present

## 2019-03-20 ENCOUNTER — Other Ambulatory Visit: Payer: Self-pay | Admitting: Internal Medicine

## 2019-03-20 MED FILL — VIT D2 1.25 MG (50,000 UNIT: 1.25 MG | 84 days supply | Qty: 12 | Fill #1

## 2019-03-20 MED FILL — AMLODIPINE 2.5 MG TABLET: 2.5 | 90 days supply | Qty: 90 | Fill #0

## 2019-03-22 DIAGNOSIS — R2231 Localized swelling, mass and lump, right upper limb: Secondary | ICD-10-CM | POA: Diagnosis not present

## 2019-03-23 ENCOUNTER — Ambulatory Visit: Payer: 59 | Admitting: Podiatry

## 2019-03-23 ENCOUNTER — Other Ambulatory Visit: Payer: Self-pay | Admitting: Orthopedic Surgery

## 2019-03-23 ENCOUNTER — Telehealth: Payer: Self-pay | Admitting: Internal Medicine

## 2019-03-23 NOTE — Telephone Encounter (Signed)
Faxed Surgery Clearance to Dr Daryll Brod. Fax [phone]

## 2019-03-29 MED FILL — ROSUVASTATIN CALCIUM 5 MG T: 5 | 84 days supply | Qty: 36 | Fill #3

## 2019-04-18 ENCOUNTER — Other Ambulatory Visit: Payer: Self-pay

## 2019-04-18 ENCOUNTER — Encounter (HOSPITAL_BASED_OUTPATIENT_CLINIC_OR_DEPARTMENT_OTHER): Payer: Self-pay | Admitting: Orthopedic Surgery

## 2019-04-21 ENCOUNTER — Other Ambulatory Visit (HOSPITAL_COMMUNITY)
Admission: RE | Admit: 2019-04-21 | Discharge: 2019-04-21 | Disposition: A | Discharge status: Home / Self Care | Payer: 59 | Source: Ambulatory Visit | Attending: Orthopedic Surgery | Admitting: Orthopedic Surgery

## 2019-04-21 ENCOUNTER — Other Ambulatory Visit: Payer: Self-pay

## 2019-04-21 ENCOUNTER — Encounter (HOSPITAL_BASED_OUTPATIENT_CLINIC_OR_DEPARTMENT_OTHER)
Admission: RE | Admit: 2019-04-21 | Discharge: 2019-04-21 | Disposition: A | Discharge status: Home / Self Care | Payer: 59 | Source: Ambulatory Visit | Attending: Orthopedic Surgery | Admitting: Orthopedic Surgery

## 2019-04-21 DIAGNOSIS — Z20822 Contact with and (suspected) exposure to covid-19: Secondary | ICD-10-CM | POA: Insufficient documentation

## 2019-04-21 DIAGNOSIS — Z01812 Encounter for preprocedural laboratory examination: Secondary | ICD-10-CM | POA: Diagnosis not present

## 2019-04-21 LAB — SARS CORONAVIRUS 2 (TAT 6-24 HRS): SARS Coronavirus 2: NEGATIVE

## 2019-04-21 MED FILL — LEVOTHYROXINE 50 MCG TABLET: 50 | 90 days supply | Qty: 90 | Fill #2

## 2019-04-25 ENCOUNTER — Other Ambulatory Visit: Payer: Self-pay

## 2019-04-25 ENCOUNTER — Ambulatory Visit (HOSPITAL_BASED_OUTPATIENT_CLINIC_OR_DEPARTMENT_OTHER): Payer: 59 | Admitting: Anesthesiology

## 2019-04-25 ENCOUNTER — Ambulatory Visit (HOSPITAL_BASED_OUTPATIENT_CLINIC_OR_DEPARTMENT_OTHER)
Admission: RE | Admit: 2019-04-25 | Discharge: 2019-04-25 | Disposition: A | Discharge status: Home / Self Care | Payer: 59 | Attending: Orthopedic Surgery | Admitting: Orthopedic Surgery

## 2019-04-25 ENCOUNTER — Encounter (HOSPITAL_BASED_OUTPATIENT_CLINIC_OR_DEPARTMENT_OTHER)
Admission: RE | Disposition: A | Discharge status: Home / Self Care | Payer: Self-pay | Source: Home / Self Care | Attending: Orthopedic Surgery

## 2019-04-25 ENCOUNTER — Encounter (HOSPITAL_BASED_OUTPATIENT_CLINIC_OR_DEPARTMENT_OTHER): Payer: Self-pay | Admitting: Orthopedic Surgery

## 2019-04-25 DIAGNOSIS — I10 Essential (primary) hypertension: Secondary | ICD-10-CM | POA: Diagnosis not present

## 2019-04-25 DIAGNOSIS — Z8349 Family history of other endocrine, nutritional and metabolic diseases: Secondary | ICD-10-CM | POA: Insufficient documentation

## 2019-04-25 DIAGNOSIS — R2231 Localized swelling, mass and lump, right upper limb: Secondary | ICD-10-CM | POA: Diagnosis not present

## 2019-04-25 DIAGNOSIS — D700 Congenital agranulocytosis: Secondary | ICD-10-CM | POA: Insufficient documentation

## 2019-04-25 DIAGNOSIS — E039 Hypothyroidism, unspecified: Secondary | ICD-10-CM | POA: Diagnosis not present

## 2019-04-25 DIAGNOSIS — D1809 Hemangioma of other sites: Secondary | ICD-10-CM | POA: Diagnosis not present

## 2019-04-25 DIAGNOSIS — D1801 Hemangioma of skin and subcutaneous tissue: Secondary | ICD-10-CM | POA: Diagnosis not present

## 2019-04-25 DIAGNOSIS — Z886 Allergy status to analgesic agent status: Secondary | ICD-10-CM | POA: Insufficient documentation

## 2019-04-25 DIAGNOSIS — J45909 Unspecified asthma, uncomplicated: Secondary | ICD-10-CM | POA: Diagnosis not present

## 2019-04-25 HISTORY — DX: Essential (primary) hypertension: I10

## 2019-04-25 HISTORY — DX: Other specified postprocedural states: Z98.890

## 2019-04-25 HISTORY — PX: MASS EXCISION: SHX2000

## 2019-04-25 HISTORY — DX: Other complications of anesthesia, initial encounter: T88.59XA

## 2019-04-25 HISTORY — DX: Localized swelling, mass and lump, right upper limb: R22.31

## 2019-04-25 HISTORY — DX: Hypothyroidism, unspecified: E03.9

## 2019-04-25 HISTORY — DX: Nausea with vomiting, unspecified: R11.2

## 2019-04-25 SURGERY — EXCISION MASS
Anesthesia: Monitor Anesthesia Care | Site: Finger | Laterality: Right

## 2019-04-25 MED ORDER — TRANEXAMIC ACID-NACL 1000-0.7 MG/100ML-% IV SOLN
INTRAVENOUS | Status: AC
  Filled 2019-04-25: qty 100

## 2019-04-25 MED ORDER — BUPIVACAINE HCL (PF) 0.25 % IJ SOLN
INTRAMUSCULAR | Status: AC
  Filled 2019-04-25: qty 30

## 2019-04-25 MED ORDER — FENTANYL CITRATE (PF) 100 MCG/2ML IJ SOLN: 25.0000 ug | INTRAMUSCULAR | Status: DC | PRN

## 2019-04-25 MED ORDER — CHLORHEXIDINE GLUCONATE 4 % EX LIQD: 60.0000 mL | Freq: Once | CUTANEOUS | Status: DC

## 2019-04-25 MED ORDER — LIDOCAINE HCL (PF) 0.5 % IJ SOLN
INTRAMUSCULAR | Status: DC | PRN
  Administered 2019-04-25: 30 mL via INTRAVENOUS

## 2019-04-25 MED ORDER — BUPIVACAINE HCL (PF) 0.25 % IJ SOLN
INTRAMUSCULAR | Status: DC | PRN
  Administered 2019-04-25: 5 mL

## 2019-04-25 MED ORDER — CEFAZOLIN SODIUM-DEXTROSE 2-4 GM/100ML-% IV SOLN
INTRAVENOUS | Status: AC
  Filled 2019-04-25: qty 100

## 2019-04-25 MED ORDER — FENTANYL CITRATE (PF) 100 MCG/2ML IJ SOLN
50.0000 ug | INTRAMUSCULAR | Status: DC | PRN
  Administered 2019-04-25: 100 ug via INTRAVENOUS

## 2019-04-25 MED ORDER — FENTANYL CITRATE (PF) 100 MCG/2ML IJ SOLN
INTRAMUSCULAR | Status: AC
  Filled 2019-04-25: qty 2

## 2019-04-25 MED ORDER — DOXYCYCLINE HYCLATE 50 MG PO CAPS: 50.0000 mg | ORAL_CAPSULE | Freq: Two times a day (BID) | ORAL | 0 refills | Status: DC

## 2019-04-25 MED ORDER — LACTATED RINGERS IV SOLN: INTRAVENOUS | Status: DC

## 2019-04-25 MED ORDER — TRAMADOL HCL 50 MG PO TABS: 50.0000 mg | ORAL_TABLET | Freq: Four times a day (QID) | ORAL | 0 refills | Status: DC | PRN

## 2019-04-25 MED ORDER — ACETAMINOPHEN 500 MG PO TABS
1000.0000 mg | ORAL_TABLET | Freq: Once | ORAL | Status: AC
  Administered 2019-04-25: 09:00:00 1000 mg via ORAL

## 2019-04-25 MED ORDER — SCOPOLAMINE 1 MG/3DAYS TD PT72
MEDICATED_PATCH | TRANSDERMAL | Status: AC
  Filled 2019-04-25: qty 1

## 2019-04-25 MED ORDER — MIDAZOLAM HCL 2 MG/2ML IJ SOLN
INTRAMUSCULAR | Status: AC
  Filled 2019-04-25: qty 2

## 2019-04-25 MED ORDER — PROPOFOL 10 MG/ML IV BOLUS
INTRAVENOUS | Status: DC | PRN
  Administered 2019-04-25 (×2): 20 mg via INTRAVENOUS

## 2019-04-25 MED ORDER — MIDAZOLAM HCL 2 MG/2ML IJ SOLN
1.0000 mg | INTRAMUSCULAR | Status: DC | PRN
  Administered 2019-04-25: 10:00:00 2 mg via INTRAVENOUS

## 2019-04-25 MED ORDER — ONDANSETRON HCL 4 MG/2ML IJ SOLN
INTRAMUSCULAR | Status: DC | PRN
  Administered 2019-04-25: 4 mg via INTRAVENOUS

## 2019-04-25 MED ORDER — CEFAZOLIN SODIUM-DEXTROSE 2-4 GM/100ML-% IV SOLN
2.0000 g | INTRAVENOUS | Status: AC
  Administered 2019-04-25: 10:00:00 2 g via INTRAVENOUS

## 2019-04-25 MED ORDER — ACETAMINOPHEN 500 MG PO TABS
ORAL_TABLET | ORAL | Status: AC
  Filled 2019-04-25: qty 2

## 2019-04-25 MED FILL — traMADol HCL 50 MG TABS: 50 | 5 days supply | Qty: 20 | Fill #0

## 2019-04-25 MED FILL — DOXYCYCLINE HYC 50 MG CAP: 50 | 7 days supply | Qty: 14 | Fill #0

## 2019-04-25 SURGICAL SUPPLY — 44 items
APL PRP STRL LF DISP 70% ISPRP (MISCELLANEOUS) ×1
BLADE SURG 15 STRL LF DISP TIS (BLADE) ×1 IMPLANT
BLADE SURG 15 STRL SS (BLADE) ×3
BNDG CMPR 9X4 STRL LF SNTH (GAUZE/BANDAGES/DRESSINGS)
BNDG COHESIVE 1X5 TAN STRL LF (GAUZE/BANDAGES/DRESSINGS) ×3 IMPLANT
BNDG COHESIVE 2X5 TAN STRL LF (GAUZE/BANDAGES/DRESSINGS) IMPLANT
BNDG COHESIVE 3X5 TAN STRL LF (GAUZE/BANDAGES/DRESSINGS) IMPLANT
BNDG ESMARK 4X9 LF (GAUZE/BANDAGES/DRESSINGS) IMPLANT
BNDG GAUZE ELAST 4 BULKY (GAUZE/BANDAGES/DRESSINGS) IMPLANT
CHLORAPREP W/TINT 26 (MISCELLANEOUS) ×3 IMPLANT
CORD BIPOLAR FORCEPS 12FT (ELECTRODE) ×3 IMPLANT
COVER BACK TABLE 60X90IN (DRAPES) ×3 IMPLANT
COVER MAYO STAND STRL (DRAPES) ×3 IMPLANT
COVER WAND RF STERILE (DRAPES) IMPLANT
CUFF TOURN SGL QUICK 18X4 (TOURNIQUET CUFF) ×2 IMPLANT
DECANTER SPIKE VIAL GLASS SM (MISCELLANEOUS) IMPLANT
DRAIN PENROSE 1/2X12 LTX STRL (WOUND CARE) IMPLANT
DRAPE EXTREMITY T 121X128X90 (DISPOSABLE) ×3 IMPLANT
DRAPE SURG 17X23 STRL (DRAPES) ×3 IMPLANT
GAUZE SPONGE 4X4 12PLY STRL (GAUZE/BANDAGES/DRESSINGS) ×3 IMPLANT
GAUZE XEROFORM 1X8 LF (GAUZE/BANDAGES/DRESSINGS) ×3 IMPLANT
GLOVE BIOGEL PI IND STRL 8.5 (GLOVE) ×1 IMPLANT
GLOVE BIOGEL PI INDICATOR 8.5 (GLOVE) ×2
GLOVE SURG ORTHO 8.0 STRL STRW (GLOVE) ×3 IMPLANT
GOWN STRL REUS W/ TWL LRG LVL3 (GOWN DISPOSABLE) ×1 IMPLANT
GOWN STRL REUS W/TWL LRG LVL3 (GOWN DISPOSABLE) ×6
GOWN STRL REUS W/TWL XL LVL3 (GOWN DISPOSABLE) ×3 IMPLANT
NDL PRECISIONGLIDE 27X1.5 (NEEDLE) ×1 IMPLANT
NDL SAFETY ECLIPSE 18X1.5 (NEEDLE) IMPLANT
NEEDLE HYPO 18GX1.5 SHARP (NEEDLE)
NEEDLE PRECISIONGLIDE 27X1.5 (NEEDLE) ×3 IMPLANT
NS IRRIG 1000ML POUR BTL (IV SOLUTION) ×3 IMPLANT
PACK BASIN DAY SURGERY FS (CUSTOM PROCEDURE TRAY) ×3 IMPLANT
PAD CAST 3X4 CTTN HI CHSV (CAST SUPPLIES) IMPLANT
PADDING CAST COTTON 3X4 STRL (CAST SUPPLIES)
SPLINT PLASTER CAST XFAST 3X15 (CAST SUPPLIES) IMPLANT
SPLINT PLASTER XTRA FASTSET 3X (CAST SUPPLIES)
STOCKINETTE 4X48 STRL (DRAPES) ×3 IMPLANT
SUT ETHILON 4 0 PS 2 18 (SUTURE) ×3 IMPLANT
SUT VIC AB 4-0 P2 18 (SUTURE) IMPLANT
SYR BULB 3OZ (MISCELLANEOUS) ×3 IMPLANT
SYR CONTROL 10ML LL (SYRINGE) ×3 IMPLANT
TOWEL GREEN STERILE FF (TOWEL DISPOSABLE) ×3 IMPLANT
UNDERPAD 30X36 HEAVY ABSORB (UNDERPADS AND DIAPERS) ×3 IMPLANT

## 2019-04-25 NOTE — Op Note (Signed)
NAME: Erin [name] RECORD NO: [id number] DATE OF BIRTH: 1965-07-31 FACILITY: [facility] LOCATION: [facility] PHYSICIAN: Wynonia Sours, MD   OPERATIVE REPORT   DATE OF PROCEDURE: 04/25/19    PREOPERATIVE DIAGNOSIS:   Mass right small finger distal phalanx   POSTOPERATIVE DIAGNOSIS:   Same   PROCEDURE:   Excision mass right small finger distal phalanx   SURGEON: Daryll Brod, M.D.   ASSISTANT: Leverne Humbles, [facility]   ANESTHESIA:  Bier block with sedation and Local   INTRAVENOUS FLUIDS:  Per anesthesia flow sheet.   ESTIMATED BLOOD LOSS:  Minimal.   COMPLICATIONS:  None.   SPECIMENS:   Excision   TOURNIQUET TIME:   For anesthesia  Total Tourniquet Time Documented: Forearm (Right) - 15 minutes Total: Forearm (Right) - 15 minutes    DISPOSITION:  Stable to PACU.   INDICATIONS: Patient is a 54 year old female with a history of a mass of the distal phalanx of her small finger right hand.  She is desirous having this excised.  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  It does have a bluish discoloration.  She has elected to have this excised.  Preperi-and postoperative course been discussed along with risks and complications.  She is aware there is no guarantee to the surgery possibility of infection recurrence injury to arteries nerves tendons complete relief symptoms dystrophy.  Preoperative area the patient is seen the extremity marked by both patient and surgeon antibiotic given  OPERATIVE COURSE: Patient is brought to the operating room from [facility] IV regional anesthetic was carried out without difficulty under the direction the anesthesia department.  She was prepped using ChloraPrep in a supine position with the right arm free.  A 3-minute dry time was allowed and timeout taken to confirm patient procedure.  A metacarpal block was given with quarter percent bupivacaine without epinephrine approximately 6 cc was used.   An oblique incision was made over the distal phalanx ulnar aspect carried down through subcutaneous tissue a blue mass was immediately encountered neurovascular structures were identified and protected the mass appeared to be blood-filled.  This appeared to be coming from a small volar vein.  This was isolated and measured approximately 1.3 mm in size.  The feeding vessels were coagulated with bipolar.  The mass was sent to pathology.  Was copious irrigated with saline.  Skin was closed erupted 4 nylon sutures.  Sterile compressive dressing for splint to the finger was applied after deflation of the tourniquet which confirmed normal circulation to the pulp of the finger.  Patient tolerated procedure well was taken to the recovery room for observation in satisfactory condition.  She will be discharged home to return [place] in 1 week on Tylenol ibuprofen for pain with tramadol as a backup and doxycycline for possible infection.   Daryll Brod, MD Electronically signed, 04/25/19

## 2019-04-25 NOTE — H&P (Signed)
Erin [name] is an 54 y.o. female.   Chief Complaint:mass right small finger [id number] is a 54 year old left-hand-dominant female referred by Dr. Renold Genta for consultation regarding tender mass on the volar aspect DIP joint of her right small finger. This began approximately a week to 10 days ago. She recalls no history of injury. It is mildly tender to palpation. He has no discrete history of trauma. She states she had a similar problem on her left thumb which are resolved on its own. She has no history of diabetes thyroid problems arthritis or gout. She has a history of neutropenia. She has not taken anything for it.She was referred for ultrasound. This is been done by Dr. 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 Allergy   . Anemia    no meds  . Asthma    as child  . Complication of anesthesia   . Fibrocystic breast disease   . Finger mass, right    small finger  . Hemorrhoids   . Hypertension   . Hypothyroidism   . PONV (postoperative nausea and vomiting)   . Thyroid disease     Past Surgical History:  Procedure Laterality Date  . BREAST CYST EXCISION Left   . BREAST EXCISIONAL BIOPSY Bilateral   . BREAST SURGERY     fibrocystic breast/Bil  . TUBAL LIGATION    . WISDOM TOOTH EXTRACTION      Family History  Problem Relation Age of Onset  . Diabetes Mother   . Hypertension Mother   . Hypertension Father   . Diabetes Father   . Prostate cancer Maternal Uncle   . Hyperlipidemia Other   . Breast cancer Neg Hx    Social History:  reports that she has never smoked. She has never used smokeless tobacco. She reports current alcohol use. She reports that she does not use drugs.  Allergies:  Allergies  Allergen Reactions  . Aspirin     REACTION: Hives, Wheezing  . Nsaids     REACTION: Hives, Wheezing  . Shellfish Allergy Other (See Comments)   hives    No medications prior to admission.    No results found for this or any previous visit (from the past 48 hour(s)).  No results found.   Pertinent items are noted in HPI.  Height 5\' 9"  (1.753 m), weight 85 kg, last menstrual period 11/04/2015.  General appearance: alert, cooperative and appears stated age Head: Normocephalic, without obvious abnormality Neck: no JVD Resp: clear to auscultation bilaterally Cardio: regular rate and rhythm, S1, S2 normal, no murmur, click, rub or gallop GI: soft, non-tender; bowel sounds normal; no masses,  no organomegaly Extremities: mass right small finger Pulses: 2+ and symmetric Skin: Skin color, texture, turgor normal. 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 She would like to have this excised. Preperi-and postoperative course are discussed along with risk and complications. She is aware there is no guarantee to the surgery the possibility of infection recurrence injury to arteries nerves tendons complete relief symptoms dystrophy. Would recommend place her on an antibiotic postoperatively she will contact Dr. Renold Genta who takes care of her neutropenia. Letting her know that the surgery is anticipated. This is scheduled as an outpatient under regional anesthesia.   Dominica Severin  Abdullah Rizzi 04/25/2019, 5:03 AM

## 2019-04-25 NOTE — Anesthesia Postprocedure Evaluation (Signed)
Anesthesia Post Note  Patient: Erin [name]  Procedure(s) Performed: EXCISION MASS DISTAL PHALANX RIGHT SMALL FINGER (Right Finger)     Patient location during evaluation: PACU Anesthesia Type: MAC and Bier Block Level of consciousness: awake and alert Pain management: pain level controlled Vital Signs Assessment: post-procedure vital signs reviewed and stable Respiratory status: spontaneous breathing, nonlabored ventilation, respiratory function stable and patient connected to nasal cannula oxygen Cardiovascular status: stable and blood pressure returned to baseline Postop Assessment: no apparent nausea or vomiting Anesthetic complications: no    Last Vitals:  Vitals:   04/25/19 1045 04/25/19 1100  BP: 131/88 133/90  Pulse: (!) 57 64  Resp: (!) 9 17  Temp:    SpO2: 97% 99%    Last Pain:  Vitals:   04/25/19 1115  TempSrc:   PainSc: 0-No pain                 Kristy Catoe L Verdene Creson

## 2019-04-25 NOTE — Discharge Instructions (Addendum)
Hand Center Instructions °Hand Surgery ° °Wound Care: °Keep your hand elevated above the level of your heart.  Do not allow it to dangle by your side.  Keep the dressing dry and do not remove it unless your doctor advises you to do so.  He will usually change it at the time of your post-op visit.  Moving your fingers is advised to stimulate circulation but will depend on the site of your surgery.  If you have a splint applied, your doctor will advise you regarding movement. ° °Activity: °Do not drive or operate machinery today.  Rest today and then you may return to your normal activity and work as indicated by your physician. ° °Diet:  °Drink liquids today or eat a light diet.  You may resume a regular diet tomorrow.   ° °General expectations: °Pain for two to three days. °Fingers may become slightly swollen. ° °Call your doctor if any of the following occur: °Severe pain not relieved by pain medication. °Elevated temperature. °Dressing soaked with blood. °Inability to move fingers. °White or bluish color to fingers. ° ° ° ° °Post Anesthesia Home Care Instructions ° °Activity: °Get plenty of rest for the remainder of the day. A responsible individual must stay with you for 24 hours following the procedure.  °For the next 24 hours, DO NOT: °-Drive a car °-Operate machinery °-Drink alcoholic beverages °-Take any medication unless instructed by your physician °-Make any legal decisions or sign important papers. ° °Meals: °Start with liquid foods such as gelatin or soup. Progress to regular foods as tolerated. Avoid greasy, spicy, heavy foods. If nausea and/or vomiting occur, drink only clear liquids until the nausea and/or vomiting subsides. Call your physician if vomiting continues. ° °Special Instructions/Symptoms: °Your throat may feel dry or sore from the anesthesia or the breathing tube placed in your throat during surgery. If this causes discomfort, gargle with warm salt water. The discomfort should disappear  within 24 hours. ° °If you had a scopolamine patch placed behind your ear for the management of post- operative nausea and/or vomiting: ° °1. The medication in the patch is effective for 72 hours, after which it should be removed.  Wrap patch in a tissue and discard in the trash. Wash hands thoroughly with soap and water. °2. You may remove the patch earlier than 72 hours if you experience unpleasant side effects which may include dry mouth, dizziness or visual disturbances. °3. Avoid touching the patch. Wash your hands with soap and water after contact with the patch. °   °Regional Anesthesia Blocks ° °1. Numbness or the inability to move the "blocked" extremity may last from 3-48 hours after placement. The length of time depends on the medication injected and your individual response to the medication. If the numbness is not going away after 48 hours, call your surgeon. ° °2. The extremity that is blocked will need to be protected until the numbness is gone and the  Strength has returned. Because you cannot feel it, you will need to take extra care to avoid injury. Because it may be weak, you may have difficulty moving it or using it. You may not know what position it is in without looking at it while the block is in effect. ° °3. For blocks in the legs and feet, returning to weight bearing and walking needs to be done carefully. You will need to wait until the numbness is entirely gone and the strength has returned. You should be able to move   your leg and foot normally before you try and bear weight or walk. You will need someone to be with you when you first try to ensure you do not fall and possibly risk injury. ° °4. Bruising and tenderness at the needle site are common side effects and will resolve in a few days. ° °5. Persistent numbness or new problems with movement should be communicated to the surgeon or the [facility] ([phone])/ [facility] ([phone]).Call your surgeon  if you experience:  ° °1.  Fever over 101.0. °2.  Inability to urinate. °3.  Nausea and/or vomiting. °4.  Extreme swelling or bruising at the surgical site. °5.  Continued bleeding from the incision. °6.  Increased pain, redness or drainage from the incision. °7.  Problems related to your pain medication. °8.  Any problems and/or concerns °

## 2019-04-25 NOTE — Brief Op Note (Signed)
04/25/2019  10:35 AM  PATIENT:  Erin [name]  54 y.o. female  PRE-OPERATIVE DIAGNOSIS:  MASS DISTAL PHALANX RIGHT SMALL FINGER  POST-OPERATIVE DIAGNOSIS:  MASS DISTAL PHALANX RIGHT SMALL FINGER  PROCEDURE:  Procedure(s) with comments: EXCISION MASS DISTAL PHALANX RIGHT SMALL FINGER (Right) - IV REGIONAL FOREARM BLOCK  SURGEON:  Surgeon(s) and Role:    * Daryll Brod, MD - Primary  PHYSICIAN ASSISTANT:   ASSISTANTS: Leverne Humbles, PAC   ANESTHESIA:   local, regional and IV sedation  EBL:  2 mL   BLOOD ADMINISTERED:none  DRAINS: none   LOCAL MEDICATIONS USED:  BUPIVICAINE   SPECIMEN:  Excision  DISPOSITION OF SPECIMEN:  PATHOLOGY  COUNTS:  YES  TOURNIQUET:   Total Tourniquet Time Documented: Forearm (Right) - 15 minutes Total: Forearm (Right) - 15 minutes   DICTATION: .Dragon Dictation  PLAN OF CARE: Discharge to home after PACU  PATIENT DISPOSITION:  PACU - hemodynamically stable.

## 2019-04-25 NOTE — Anesthesia Preprocedure Evaluation (Addendum)
Anesthesia Evaluation  Patient identified by MRN, date of birth, ID band Patient awake    Reviewed: Allergy & Precautions, Patient's Chart, lab work & pertinent test results  History of Anesthesia Complications (+) PONV and history of anesthetic complications  Airway Mallampati: I  TM Distance: >3 FB Neck ROM: Full    Dental no notable dental hx. (+) Teeth Intact, Dental Advisory Given   Pulmonary asthma ,    Pulmonary exam normal breath sounds clear to auscultation       Cardiovascular hypertension, Pt. on medications Normal cardiovascular exam Rhythm:Regular Rate:Normal  HLD   Neuro/Psych  Headaches, negative psych ROS   GI/Hepatic negative GI ROS, Neg liver ROS,   Endo/Other  Hypothyroidism   Renal/GU negative Renal ROS  negative genitourinary   Musculoskeletal negative musculoskeletal ROS (+)   Abdominal   Peds  Hematology negative hematology ROS (+)   Anesthesia Other Findings   Reproductive/Obstetrics                            Anesthesia Physical Anesthesia Plan  ASA: III  Anesthesia Plan: MAC and Bier Block and Bier Block-LIDOCAINE ONLY   Post-op Pain Management:    Induction: Intravenous  PONV Risk Score and Plan: 3 and Propofol infusion, Treatment may vary due to age or medical condition, Midazolam and Ondansetron  Airway Management Planned: Natural Airway  Additional Equipment:   Intra-op Plan:   Post-operative Plan:   Informed Consent: I have reviewed the patients History and Physical, chart, labs and discussed the procedure including the risks, benefits and alternatives for the proposed anesthesia with the patient or authorized representative who has indicated his/her understanding and acceptance.     Dental advisory given  Plan Discussed with: CRNA  Anesthesia Plan Comments:         Anesthesia Quick Evaluation

## 2019-04-25 NOTE — Transfer of Care (Signed)
Immediate Anesthesia Transfer of Care Note  Patient: Erin [name]  Procedure(s) Performed: EXCISION MASS DISTAL PHALANX RIGHT SMALL FINGER (Right Finger)  Patient Location: PACU  Anesthesia Type:MAC and Bier block  Level of Consciousness: awake, alert  and oriented  Airway & Oxygen Therapy: Patient Spontanous Breathing and Patient connected to face mask oxygen  Post-op Assessment: Report given to RN and Post -op Vital signs reviewed and stable  Post vital signs: Reviewed and stable  Last Vitals:  Vitals Value Taken Time  BP 140/84 04/25/19 1040  Temp    Pulse 64 04/25/19 1042  Resp 13 04/25/19 1042  SpO2 99 % 04/25/19 1042  Vitals shown include unvalidated device data.  Last Pain:  Vitals:   04/25/19 0846  TempSrc: Oral  PainSc: 0-No pain         Complications: No apparent anesthesia complications

## 2019-04-26 ENCOUNTER — Encounter: Payer: Self-pay | Admitting: *Deleted

## 2019-04-26 ENCOUNTER — Other Ambulatory Visit: Payer: Self-pay

## 2019-04-27 LAB — SURGICAL PATHOLOGY

## 2019-05-24 ENCOUNTER — Other Ambulatory Visit: Payer: Self-pay | Admitting: Internal Medicine

## 2019-05-24 MED FILL — MUPIROCIN 2% OINTMENT: 2 | 15 days supply | Qty: 22 | Fill #0

## 2019-05-31 ENCOUNTER — Other Ambulatory Visit: Payer: Self-pay

## 2019-05-31 ENCOUNTER — Other Ambulatory Visit: Payer: Self-pay | Admitting: Internal Medicine

## 2019-05-31 MED ORDER — FLUTICASONE PROPIONATE 50 MCG/ACT NA SUSP: 2.0000 | Freq: Every day | NASAL | 11 refills | Status: DC

## 2019-05-31 MED ORDER — HYDROCORTISONE ACETATE 25 MG RE SUPP: RECTAL | 11 refills | Status: DC

## 2019-05-31 MED FILL — HYDROCORTISONE AC 25 MG SUP: 25 | 6 days supply | Qty: 12 | Fill #0

## 2019-05-31 MED FILL — FLUTICASONE PROP 50 MCG SPR: 50 | 90 days supply | Qty: 48 | Fill #0

## 2019-06-05 MED FILL — VIT D2 1.25 MG (50,000 UNIT: 1.25 MG | 90 days supply | Qty: 12 | Fill #0

## 2019-06-21 MED FILL — ROSUVASTATIN CALCIUM 5 MG T: 5 | 84 days supply | Qty: 36 | Fill #0

## 2019-06-21 MED FILL — AMLODIPINE 2.5 MG TABLET: 2.5 | 90 days supply | Qty: 90 | Fill #0

## 2019-06-21 MED FILL — HYDROCORTISONE AC 25 MG SUP: 25 | 6 days supply | Qty: 12 | Fill #0

## 2019-07-12 MED FILL — LEVOTHYROXINE SODIUM 50 MCG: 50 | 90 days supply | Qty: 90 | Fill #0

## 2019-07-12 MED FILL — HYDROCORTISONE AC 25 MG SUP: 25 | 6 days supply | Qty: 12 | Fill #1

## 2019-07-25 ENCOUNTER — Other Ambulatory Visit: Payer: Self-pay

## 2019-07-25 ENCOUNTER — Other Ambulatory Visit: Payer: 59 | Admitting: Internal Medicine

## 2019-07-25 DIAGNOSIS — E7800 Pure hypercholesterolemia, unspecified: Secondary | ICD-10-CM | POA: Diagnosis not present

## 2019-07-25 LAB — LIPID PANEL
Cholesterol: 172 mg/dL (ref <200)
HDL: 61 mg/dL (ref >=50)
LDL Cholesterol (Calc): 96 mg/dL (calc)
Non-HDL Cholesterol (Calc): 111 mg/dL (calc) (ref <130)
Total CHOL/HDL Ratio: 2.8 (calc) (ref <5.0)
Triglycerides: 66 mg/dL (ref <150)

## 2019-07-27 ENCOUNTER — Other Ambulatory Visit: Payer: Self-pay

## 2019-07-27 ENCOUNTER — Encounter: Payer: Self-pay | Admitting: Internal Medicine

## 2019-07-27 ENCOUNTER — Ambulatory Visit (INDEPENDENT_AMBULATORY_CARE_PROVIDER_SITE_OTHER): Payer: 59 | Admitting: Internal Medicine

## 2019-07-27 VITALS — BP 110/80 | HR 86 | Ht 68.0 in | Wt 187.0 lb

## 2019-07-27 DIAGNOSIS — Z8669 Personal history of other diseases of the nervous system and sense organs: Secondary | ICD-10-CM

## 2019-07-27 DIAGNOSIS — E049 Nontoxic goiter, unspecified: Secondary | ICD-10-CM

## 2019-07-27 DIAGNOSIS — D708 Other neutropenia: Secondary | ICD-10-CM

## 2019-07-27 DIAGNOSIS — J309 Allergic rhinitis, unspecified: Secondary | ICD-10-CM | POA: Diagnosis not present

## 2019-07-27 DIAGNOSIS — I10 Essential (primary) hypertension: Secondary | ICD-10-CM

## 2019-07-27 DIAGNOSIS — E7800 Pure hypercholesterolemia, unspecified: Secondary | ICD-10-CM

## 2019-07-27 DIAGNOSIS — E559 Vitamin D deficiency, unspecified: Secondary | ICD-10-CM | POA: Diagnosis not present

## 2019-07-28 MED ORDER — HYDROCORTISONE (PERIANAL) 2.5 % EX CREA: 1.0000 "application " | TOPICAL_CREAM | Freq: Two times a day (BID) | CUTANEOUS | 5 refills | Status: DC

## 2019-07-28 MED FILL — PROCTOZONE-HC 2.5 % CREA: 2.5 | 14 days supply | Qty: 30 | Fill #0

## 2019-08-06 ENCOUNTER — Encounter: Payer: Self-pay | Admitting: Internal Medicine

## 2019-08-06 NOTE — Progress Notes (Signed)
   Subjective:    Patient ID: Erin [name], female    DOB: 03-04-1966, 54 y.o.   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  She also has hypothyroidism and vitamin D deficiency.  Is maintained on high-dose vitamin D weekly.  History of allergic rhinitis treated with as needed Ventolin and also Flonase nasal spray.  No new complaints.  Has done well during the pandemic.  Has had 2 COVID-19 immunizations.  Had removal of mass on the volar aspect DIP joint of right fifth finger in February.  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  Review of Systems no new complaints-she saw Dr. Chalmers Cater for thyroid ultrasound December 2020.  Had heterogeneous and enlarged thyroid gland.  Several subcentimeter nodules bilaterally not requiring biopsy.     Objective:   Physical Exam Blood pressure 110/80 pulse 86, patient is afebrile, pulse oximetry is 97%.  Weight 187 pounds.  BMI 28.44  Neck is supple.  Thyroid mildly enlarged and symmetrical.  Chest clear.  Cardiac exam regular rate and rhythm normal S1 and S2.  No carotid bruits.       Assessment & Plan:  Essential hypertension stable on amlodipine 2.5 mg daily  Hyperlipidemia treated with Crestor 5 mg 3 times a week  Vitamin D deficiency treated with high-dose vitamin D weekly  History of migraine headaches treated with Fioricet as needed  Allergic rhinitis and bronchospasm treated with as needed Ventolin and Flonase nasal spray.  Small multinodular goiter-treated by Dr. 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  Lipid panel is normal on low-dose Crestor 3 times a week.  Continue with high-dose vitamin D supplement weekly.  Continue low-dose amlodipine for essential  hypertension.  Return for health maintenance exam and fasting labs in 6 months.

## 2019-08-06 NOTE — Patient Instructions (Signed)
It was a pleasure to see you today.  Labs are within normal limits and excellent control of blood pressure on current regimen.  Continue medications as previously prescribed and return in 6 months for health maintenance exam.

## 2019-08-23 MED FILL — HYDROCORTISONE AC 25 MG SUP: 25 | 6 days supply | Qty: 12 | Fill #2

## 2019-08-29 MED FILL — VIT D2 1.25 MG (50,000 UNIT: 1.25 MG | 90 days supply | Qty: 12 | Fill #1

## 2019-09-04 MED FILL — MUPIROCIN 2% OINTMENT: 2 | 15 days supply | Qty: 22 | Fill #0

## 2019-09-18 MED FILL — ROSUVASTATIN CALCIUM 5 MG T: 5 | 84 days supply | Qty: 36 | Fill #1

## 2019-09-18 MED FILL — AMLODIPINE 2.5 MG TABLET: 2.5 | 90 days supply | Qty: 90 | Fill #1

## 2019-10-05 DIAGNOSIS — L918 Other hypertrophic disorders of the skin: Secondary | ICD-10-CM | POA: Diagnosis not present

## 2019-10-05 DIAGNOSIS — L821 Other seborrheic keratosis: Secondary | ICD-10-CM | POA: Diagnosis not present

## 2019-10-06 ENCOUNTER — Other Ambulatory Visit (HOSPITAL_COMMUNITY): Payer: Self-pay | Admitting: Endocrinology

## 2019-10-06 MED FILL — LEVOTHYROXINE SODIUM 50 MCG: 50 | 90 days supply | Qty: 90 | Fill #0

## 2019-10-23 MED FILL — MUPIROCIN 2% OINTMENT: 2 | 15 days supply | Qty: 22 | Fill #1

## 2019-10-23 MED FILL — BUTALB-ACETAMIN-CAFF 50-325: 50-325-40 | 10 days supply | Qty: 60 | Fill #0

## 2019-11-17 ENCOUNTER — Other Ambulatory Visit: Payer: Self-pay | Admitting: Internal Medicine

## 2019-11-17 MED FILL — EPINEPHRINE 0.3 MG AUTO-INJ: 0.3 | 30 days supply | Qty: 2 | Fill #0

## 2019-11-21 ENCOUNTER — Other Ambulatory Visit: Payer: Self-pay | Admitting: Internal Medicine

## 2019-11-21 DIAGNOSIS — Z1231 Encounter for screening mammogram for malignant neoplasm of breast: Secondary | ICD-10-CM

## 2019-11-23 ENCOUNTER — Other Ambulatory Visit: Payer: Self-pay | Admitting: Internal Medicine

## 2019-11-23 MED FILL — VIT D2 1.25 MG (50,000 UNIT: 1.25 MG | 84 days supply | Qty: 12 | Fill #0

## 2019-12-04 MED FILL — FLUTICASONE PROP 50 MCG SPR: 50 | 90 days supply | Qty: 48 | Fill #1

## 2019-12-04 MED FILL — ROSUVASTATIN CALCIUM 5 MG T: 5 | 84 days supply | Qty: 36 | Fill #2

## 2019-12-21 ENCOUNTER — Encounter: Payer: Self-pay | Admitting: Podiatry

## 2019-12-21 ENCOUNTER — Ambulatory Visit (INDEPENDENT_AMBULATORY_CARE_PROVIDER_SITE_OTHER): Payer: 59 | Admitting: Podiatry

## 2019-12-21 ENCOUNTER — Other Ambulatory Visit: Payer: Self-pay | Admitting: Podiatry

## 2019-12-21 ENCOUNTER — Other Ambulatory Visit: Payer: Self-pay

## 2019-12-21 ENCOUNTER — Ambulatory Visit (INDEPENDENT_AMBULATORY_CARE_PROVIDER_SITE_OTHER): Payer: 59

## 2019-12-21 DIAGNOSIS — M778 Other enthesopathies, not elsewhere classified: Secondary | ICD-10-CM

## 2019-12-21 NOTE — Progress Notes (Signed)
She presents today chief complaint of pain to the forefoot left.  States is a puffy area of his back past 2 months denies any trauma states that she did fall going up stairs 1 time that caused some tenderness but now the area has become numb and tender.  Objective: Vital signs are stable alert and oriented x3 there is no erythema edema cellulitis drainage odor she has pain with palpation and end range of motion of the second metatarsophalangeal joint left foot.  Radiographs taken today do not demonstrate any type of osseous abnormalities to this area.  Toes in a rectus position.  Assessment: Capsulitis second metatarsophalangeal joint left.  Plan: Discussed etiology pathology conservative surgical therapies at this point time went ahead and performed a injection around the joint.  I will follow-up with her in about 1 month at which time we will discuss matrixectomy fibular border hallux right.

## 2019-12-25 MED FILL — AMLODIPINE BESYLATE 2.5 MG: 2.5 | 90 days supply | Qty: 90 | Fill #2

## 2019-12-26 MED FILL — HYDROCORTISONE AC 25 MG SUP: 25 | 6 days supply | Qty: 12 | Fill #3

## 2019-12-26 MED FILL — MUPIROCIN 2% OINTMENT: 2 | 15 days supply | Qty: 22 | Fill #2

## 2020-01-04 DIAGNOSIS — H5213 Myopia, bilateral: Secondary | ICD-10-CM | POA: Diagnosis not present

## 2020-01-04 DIAGNOSIS — H524 Presbyopia: Secondary | ICD-10-CM | POA: Diagnosis not present

## 2020-01-04 DIAGNOSIS — H52223 Regular astigmatism, bilateral: Secondary | ICD-10-CM | POA: Diagnosis not present

## 2020-01-04 DIAGNOSIS — H25813 Combined forms of age-related cataract, bilateral: Secondary | ICD-10-CM | POA: Diagnosis not present

## 2020-01-08 MED FILL — LEVOTHYROXINE SODIUM 50 MCG: 50 | 90 days supply | Qty: 90 | Fill #1

## 2020-01-09 ENCOUNTER — Other Ambulatory Visit: Payer: Self-pay

## 2020-01-09 ENCOUNTER — Ambulatory Visit
Admission: RE | Admit: 2020-01-09 | Discharge: 2020-01-09 | Disposition: A | Discharge status: Home / Self Care | Payer: 59 | Source: Ambulatory Visit | Attending: Internal Medicine | Admitting: Internal Medicine

## 2020-01-09 DIAGNOSIS — Z124 Encounter for screening for malignant neoplasm of cervix: Secondary | ICD-10-CM | POA: Diagnosis not present

## 2020-01-09 DIAGNOSIS — Z1231 Encounter for screening mammogram for malignant neoplasm of breast: Secondary | ICD-10-CM | POA: Diagnosis not present

## 2020-01-09 DIAGNOSIS — Z01419 Encounter for gynecological examination (general) (routine) without abnormal findings: Secondary | ICD-10-CM | POA: Diagnosis not present

## 2020-01-09 DIAGNOSIS — Z6828 Body mass index (BMI) 28.0-28.9, adult: Secondary | ICD-10-CM | POA: Diagnosis not present

## 2020-01-23 ENCOUNTER — Other Ambulatory Visit: Payer: Self-pay

## 2020-01-23 ENCOUNTER — Other Ambulatory Visit: Payer: 59 | Admitting: Internal Medicine

## 2020-01-23 DIAGNOSIS — J309 Allergic rhinitis, unspecified: Secondary | ICD-10-CM | POA: Diagnosis not present

## 2020-01-23 DIAGNOSIS — R030 Elevated blood-pressure reading, without diagnosis of hypertension: Secondary | ICD-10-CM

## 2020-01-23 DIAGNOSIS — K648 Other hemorrhoids: Secondary | ICD-10-CM

## 2020-01-23 DIAGNOSIS — Z1329 Encounter for screening for other suspected endocrine disorder: Secondary | ICD-10-CM

## 2020-01-23 DIAGNOSIS — E7800 Pure hypercholesterolemia, unspecified: Secondary | ICD-10-CM | POA: Diagnosis not present

## 2020-01-23 DIAGNOSIS — D708 Other neutropenia: Secondary | ICD-10-CM | POA: Diagnosis not present

## 2020-01-23 DIAGNOSIS — Z8669 Personal history of other diseases of the nervous system and sense organs: Secondary | ICD-10-CM | POA: Diagnosis not present

## 2020-01-23 DIAGNOSIS — I10 Essential (primary) hypertension: Secondary | ICD-10-CM | POA: Diagnosis not present

## 2020-01-23 DIAGNOSIS — R2232 Localized swelling, mass and lump, left upper limb: Secondary | ICD-10-CM | POA: Diagnosis not present

## 2020-01-23 DIAGNOSIS — Z1321 Encounter for screening for nutritional disorder: Secondary | ICD-10-CM

## 2020-01-24 LAB — COMPLETE METABOLIC PANEL WITH GFR
AG Ratio: 1.6 (calc) (ref 1.0–2.5)
ALT: 8 U/L (ref 6–29)
AST: 11 U/L (ref 10–35)
Albumin: 4.3 g/dL (ref 3.6–5.1)
Alkaline phosphatase (APISO): 51 U/L (ref 37–153)
BUN: 14 mg/dL (ref 7–25)
CO2: 27 mmol/L (ref 20–32)
Calcium: 9.4 mg/dL (ref 8.6–10.4)
Chloride: 107 mmol/L (ref 98–110)
Creat: 0.8 mg/dL (ref 0.50–1.05)
GFR, Est African American: 96 mL/min/{1.73_m2} (ref >=60)
GFR, Est Non African American: 83 mL/min/{1.73_m2} (ref >=60)
Globulin: 2.7 g/dL (calc) (ref 1.9–3.7)
Glucose, Bld: 90 mg/dL (ref 65–99)
Potassium: 4.1 mmol/L (ref 3.5–5.3)
Sodium: 143 mmol/L (ref 135–146)
Total Bilirubin: 0.5 mg/dL (ref 0.2–1.2)
Total Protein: 7 g/dL (ref 6.1–8.1)

## 2020-01-24 LAB — CBC WITH DIFFERENTIAL/PLATELET
Absolute Monocytes: 327 cells/uL (ref 200–950)
Basophils Absolute: 21 cells/uL (ref 0–200)
Basophils Relative: 0.7 %
Eosinophils Absolute: 108 cells/uL (ref 15–500)
Eosinophils Relative: 3.6 %
HCT: 36.8 % (ref 35.0–45.0)
Hemoglobin: 12.3 g/dL (ref 11.7–15.5)
Lymphs Abs: 1122 cells/uL (ref 850–3900)
MCH: 29.3 pg (ref 27.0–33.0)
MCHC: 33.4 g/dL (ref 32.0–36.0)
MCV: 87.6 fL (ref 80.0–100.0)
MPV: 10.1 fL (ref 7.5–12.5)
Monocytes Relative: 10.9 %
Neutro Abs: 1422 cells/uL — ABNORMAL LOW (ref 1500–7800)
Neutrophils Relative %: 47.4 %
Platelets: 179 10*3/uL (ref 140–400)
RBC: 4.2 10*6/uL (ref 3.80–5.10)
RDW: 12.2 % (ref 11.0–15.0)
Total Lymphocyte: 37.4 %
WBC: 3 10*3/uL — ABNORMAL LOW (ref 3.8–10.8)

## 2020-01-24 LAB — VITAMIN D 25 HYDROXY (VIT D DEFICIENCY, FRACTURES): Vit D, 25-Hydroxy: 85 ng/mL (ref 30–100)

## 2020-01-24 LAB — LIPID PANEL
Cholesterol: 177 mg/dL (ref <200)
HDL: 69 mg/dL (ref >=50)
LDL Cholesterol (Calc): 94 mg/dL (calc)
Non-HDL Cholesterol (Calc): 108 mg/dL (calc) (ref <130)
Total CHOL/HDL Ratio: 2.6 (calc) (ref <5.0)
Triglycerides: 55 mg/dL (ref <150)

## 2020-01-24 LAB — TSH: TSH: 0.7 mIU/L

## 2020-01-25 ENCOUNTER — Ambulatory Visit (INDEPENDENT_AMBULATORY_CARE_PROVIDER_SITE_OTHER): Payer: 59 | Admitting: Internal Medicine

## 2020-01-25 ENCOUNTER — Other Ambulatory Visit: Payer: Self-pay

## 2020-01-25 ENCOUNTER — Encounter: Payer: Self-pay | Admitting: Internal Medicine

## 2020-01-25 ENCOUNTER — Other Ambulatory Visit (HOSPITAL_COMMUNITY): Payer: Self-pay | Admitting: Podiatry

## 2020-01-25 ENCOUNTER — Ambulatory Visit (INDEPENDENT_AMBULATORY_CARE_PROVIDER_SITE_OTHER): Payer: 59 | Admitting: Podiatry

## 2020-01-25 VITALS — BP 120/88 | HR 77 | Ht 68.0 in | Wt 193.0 lb

## 2020-01-25 DIAGNOSIS — Z0000 Encounter for general adult medical examination without abnormal findings: Secondary | ICD-10-CM | POA: Diagnosis not present

## 2020-01-25 DIAGNOSIS — K648 Other hemorrhoids: Secondary | ICD-10-CM

## 2020-01-25 DIAGNOSIS — E7800 Pure hypercholesterolemia, unspecified: Secondary | ICD-10-CM | POA: Diagnosis not present

## 2020-01-25 DIAGNOSIS — Z86018 Personal history of other benign neoplasm: Secondary | ICD-10-CM

## 2020-01-25 DIAGNOSIS — J309 Allergic rhinitis, unspecified: Secondary | ICD-10-CM

## 2020-01-25 DIAGNOSIS — L600 Ingrowing nail: Secondary | ICD-10-CM

## 2020-01-25 DIAGNOSIS — I10 Essential (primary) hypertension: Secondary | ICD-10-CM | POA: Diagnosis not present

## 2020-01-25 DIAGNOSIS — D708 Other neutropenia: Secondary | ICD-10-CM

## 2020-01-25 DIAGNOSIS — Z8639 Personal history of other endocrine, nutritional and metabolic disease: Secondary | ICD-10-CM | POA: Diagnosis not present

## 2020-01-25 DIAGNOSIS — Z8669 Personal history of other diseases of the nervous system and sense organs: Secondary | ICD-10-CM

## 2020-01-25 DIAGNOSIS — L918 Other hypertrophic disorders of the skin: Secondary | ICD-10-CM

## 2020-01-25 DIAGNOSIS — E049 Nontoxic goiter, unspecified: Secondary | ICD-10-CM

## 2020-01-25 LAB — POCT URINALYSIS DIPSTICK
Appearance: NEGATIVE
Bilirubin, UA: NEGATIVE
Blood, UA: NEGATIVE
Glucose, UA: NEGATIVE
Ketones, UA: NEGATIVE
Leukocytes, UA: NEGATIVE
Nitrite, UA: NEGATIVE
Odor: NEGATIVE
Protein, UA: NEGATIVE
Spec Grav, UA: 1.01 (ref 1.010–1.025)
Urobilinogen, UA: 0.2 E.U./dL
pH, UA: 6.5 (ref 5.0–8.0)

## 2020-01-25 MED ORDER — NEOMYCIN-POLYMYXIN-HC 1 % OT SOLN: OTIC | 1 refills | Status: DC

## 2020-01-25 MED FILL — MUPIROCIN 2% OINTMENT: 2 | 15 days supply | Qty: 22 | Fill #3

## 2020-01-25 MED FILL — NEO/POLYMYXIN/HC EAR SOLN: 3.5-10000-1 | 10 days supply | Qty: 10 | Fill #0

## 2020-01-25 NOTE — Progress Notes (Signed)
Subjective:    Patient ID: Erin [name], female    DOB: Apr 09, 1965, 54 y.o.   MRN: [id number]  HPI 54 year old Female seen for health maintenance exam and evaluation of medical issues.   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  She is on statin medication 3 times a week.  Lipid panel is completely normal.  C-Met is normal and TSH is normal.  Vitamin D level is normal.  Urine dipstick is normal.  In May 2019 she was checked for rheumatoid arthritis.  Sed rate and CCP were drawn because she was having some arthralgias and the studies were normal.  She has a history of migraine headaches which are stable with treatment on a as needed basis with Fioricet.  History of goiter followed by Dr. Chalmers Cater, endocrinologist.  She is on low-dose thyroid suppression and and TSH is within normal limits.  [facility] does GYN exams.  History of uterine fibroids and menorrhagia.  Uses Estrace vaginal cream for atrophic vaginitis.  History of hemorrhoids treated with Anusol HC suppositories and rectal cream as needed.  Mild hypertension stable with amlodipine 2.5 mg daily.  Uses Flonase nasal spray for allergic rhinitis symptoms.  History of mild vitamin D deficiency in 2019 and 2020.  Levels were 27 and 26 respectively.  She is now on high-dose vitamin D weekly.  Social history: She is married with 2 children, son and a daughter in good health.  She works as a Insurance account manager at [employer].  Family history: Father deceased with history of hypertension and diabetes.  Mother healthy.  1 brother and 1 sister in good health.    Review of Systems she has small acrochordon right axilla which was removed today , sent for pathology and which proved to be benign acrochordon     Objective:   Physical Exam Blood pressure 120/88 pulse 77 pulse  oximetry 97% weight 193 pounds height 5 feet 8 inches BMI 29.35  Skin warm and dry.  Acrochordon right axilla which is being removed today.  Nodes none.  Neck is supple.  Symmetrically enlarged thyroid consistent with goiter.  Chest is clear to auscultation without rales or wheezing.  Breasts: No masses appreciated  Cardiac exam: Regular rate and rhythm normal S1 and S2 without murmurs or gallops.  Abdomen: Soft nondistended without hepatosplenomegaly masses or tenderness.  Extremities without pitting edema of the legs.  GYN exam deferred to gynecologist  Neurological exam: No gross focal deficits on brief neurological exam.  Psychiatric: Affect thought and judgment are normal.       Assessment & Plan:  Goiter treated with low-dose thyroid suppression by Dr. Chalmers Cater, endocrinologist with stable TSH  Acrochordon right axilla removed today after local anesthesia with 1% Xylocaine.  It was snipped off in total and sent for pathology which proved to be a benign acrochordon.  History of migraine headaches treated with as needed Fioricet  History of vitamin D deficiency-level is stable on weekly high-dose vitamin D therapy  History of benign ethnic neutropenia  History of hemorrhoids treated steroid cream and steroid suppositories as needed for flareups.  Hyperlipidemia-stable with 3 times a week statin medication.  History of asthma and allergic rhinitis.  Has inhaler as well as EpiPen.  History of uterine fibroids.  Mild essential hypertension treated with low-dose amlodipine.  Plan: Labs are stable on current medications.  Return in 1 year or as needed.  She will monitor her blood pressure at home and is welcome to call if she needs anything at all.  She has had 3 COVID-19 immunizations.  Tetanus immunization is up-to-date.  Will get Shingrix vaccine in the near future.  Has had flu vaccine through employment.  Had colonoscopy in 2017 which was normal except for internal  hemorrhoids with 10-year follow-up recommended.  Had mammogram November 2 which was normal.

## 2020-01-25 NOTE — Progress Notes (Signed)
She presents today for follow-up of her capsulitis left foot and her ingrown toenails to the tibiofibular border of the hallux right foot.  States that she would like to go ahead and have these nails removed the margins because they have been painful in the past and currently they are not infected but she would like to have them fixed.  She states that her left foot is doing perfectly no problems whatsoever.  Objective: Vital signs are stable alert oriented x3 there is no erythema edema cellulitis drainage or odor Sharp innervated nail margins tender on palpation.  She has great range of motion of the metatarsophalangeal joint.  Assessment: Ingrown nails hallux right resolved capsulitis left foot.  Plan: At this point I went ahead and performed chemical matrixectomy to the tibial border of the hallux right she tolerated procedure well she was provided with both oral and written home-going instructions as well as a prescription for Corticosporin otic I will follow-up with her in 2 weeks should she have questions or concerns she will notify us immediately.

## 2020-01-25 NOTE — Patient Instructions (Addendum)

## 2020-01-26 ENCOUNTER — Other Ambulatory Visit: Payer: Self-pay | Admitting: Internal Medicine

## 2020-01-26 DIAGNOSIS — L918 Other hypertrophic disorders of the skin: Secondary | ICD-10-CM | POA: Diagnosis not present

## 2020-01-31 ENCOUNTER — Telehealth: Payer: Self-pay | Admitting: *Deleted

## 2020-01-31 NOTE — Telephone Encounter (Signed)
Best to soak the full 2 weeks until seen for re-eval

## 2020-01-31 NOTE — Telephone Encounter (Signed)
Patient is wanting to know if she should continue to soak toe( partial nail precedure on 11/11) until follow up appt., not a lot of drainage today so she put bandage on it and has discontinued the epsom salts soaks, seemed a little irritated,started soaking  vinegar and water today.Please advise.

## 2020-02-05 ENCOUNTER — Other Ambulatory Visit (HOSPITAL_COMMUNITY): Payer: Self-pay | Admitting: Internal Medicine

## 2020-02-05 ENCOUNTER — Telehealth: Payer: Self-pay | Admitting: Internal Medicine

## 2020-02-05 MED FILL — BUTALB-ACETAMIN-CAFF 50-325: 50-325-40 | 10 days supply | Qty: 60 | Fill #1

## 2020-02-05 NOTE — Telephone Encounter (Signed)
Pt wants to know if you can send in a prescription for probiotic 4 billion count gummy to [facility] outpatient pharmacy. She is trying to get it mail ordered but they wont do it without it being a prescription. Please advise

## 2020-02-05 NOTE — Telephone Encounter (Signed)
No, this is something patient has self prescribed for herself

## 2020-02-05 NOTE — Telephone Encounter (Signed)
Informed patient thru VM per instructions in note.

## 2020-02-06 NOTE — Telephone Encounter (Signed)
completed

## 2020-02-13 ENCOUNTER — Ambulatory Visit (INDEPENDENT_AMBULATORY_CARE_PROVIDER_SITE_OTHER): Payer: 59 | Admitting: Podiatry

## 2020-02-13 ENCOUNTER — Encounter: Payer: Self-pay | Admitting: Podiatry

## 2020-02-13 ENCOUNTER — Other Ambulatory Visit: Payer: Self-pay

## 2020-02-13 DIAGNOSIS — L600 Ingrowing nail: Secondary | ICD-10-CM

## 2020-02-13 DIAGNOSIS — Z9889 Other specified postprocedural states: Secondary | ICD-10-CM

## 2020-02-13 NOTE — Progress Notes (Signed)
She presents today for follow-up of her matrixectomy to the right hallux.  States that it seems to be doing very well he says a little bit sore but other than that she is doing pretty well.  She continues to take it on a regular basis.  Objective: Vital signs are stable she is alert oriented x3.  There is no erythema edema cellulitis drainage or odor  Assessment: Well-healing surgical toe.  Plan: Recommend she continue soak at least every other day until no tenderness.  Follow-up with [place] as needed.

## 2020-02-15 DIAGNOSIS — E049 Nontoxic goiter, unspecified: Secondary | ICD-10-CM | POA: Diagnosis not present

## 2020-02-19 MED FILL — VIT D2 1.25 MG (50,000 UNIT: 1.25 MG | 84 days supply | Qty: 12 | Fill #1

## 2020-02-22 ENCOUNTER — Other Ambulatory Visit (HOSPITAL_COMMUNITY): Payer: Self-pay | Admitting: Endocrinology

## 2020-02-22 DIAGNOSIS — I10 Essential (primary) hypertension: Secondary | ICD-10-CM | POA: Diagnosis not present

## 2020-02-22 DIAGNOSIS — E039 Hypothyroidism, unspecified: Secondary | ICD-10-CM | POA: Diagnosis not present

## 2020-02-22 DIAGNOSIS — E049 Nontoxic goiter, unspecified: Secondary | ICD-10-CM | POA: Diagnosis not present

## 2020-02-22 MED FILL — LEVOTHYROXINE SODIUM 25 MCG: 25 | 90 days supply | Qty: 90 | Fill #0

## 2020-02-29 MED FILL — HYDROCORTISONE AC 25 MG SUP: 25 | 6 days supply | Qty: 12 | Fill #4

## 2020-02-29 MED FILL — ROSUVASTATIN CALCIUM 5 MG T: 5 | 84 days supply | Qty: 36 | Fill #3

## 2020-02-29 MED FILL — FLUTICASONE PROP 50 MCG SPR: 50 | 90 days supply | Qty: 48 | Fill #2

## 2020-02-29 MED FILL — SHINGRIX 50 MCG SUS: 50 | 1 days supply | Qty: 1 | Fill #0

## 2020-03-08 ENCOUNTER — Other Ambulatory Visit: Payer: Self-pay | Admitting: Internal Medicine

## 2020-03-08 MED FILL — MUPIROCIN 2% OINTMENT: 2 | 10 days supply | Qty: 22 | Fill #0

## 2020-03-17 NOTE — Patient Instructions (Signed)
It was a pleasure to see you today.  Thank you for coming in for your visit.  Continue current medications and return in 1 year or as needed.  Continue diet and exercise efforts.  Have annual mammogram.  Colonoscopy is up-to-date.  Immunizations are up-to-date.  Second Shingrix vaccine is due after the first of the year.

## 2020-03-27 ENCOUNTER — Other Ambulatory Visit: Payer: Self-pay | Admitting: Internal Medicine

## 2020-03-27 MED FILL — AMLODIPINE BESYLATE 2.5 MG: 2.5 | 90 days supply | Qty: 90 | Fill #0

## 2020-05-06 ENCOUNTER — Other Ambulatory Visit: Payer: Self-pay | Admitting: Internal Medicine

## 2020-05-06 MED FILL — VIT D2 1.25 MG (50,000 UNIT: 1.25 MG | 84 days supply | Qty: 12 | Fill #0

## 2020-05-17 ENCOUNTER — Other Ambulatory Visit: Payer: Self-pay | Admitting: Internal Medicine

## 2020-05-17 MED FILL — LEVOTHYROXINE SODIUM 25 MCG: 25 | 90 days supply | Qty: 90 | Fill #1

## 2020-05-17 MED FILL — ROSUVASTATIN CALCIUM 5 MG T: 5 | 84 days supply | Qty: 36 | Fill #0

## 2020-05-21 DIAGNOSIS — E049 Nontoxic goiter, unspecified: Secondary | ICD-10-CM | POA: Diagnosis not present

## 2020-05-26 IMAGING — US US THYROID
1 series · 14 of 25 positions shown · non-contrast
Comparison: Prior thyroid ultrasound 02/28/2018

CLINICAL DATA: Goiter.

EXAM:
THYROID ULTRASOUND
TECHNIQUE: Ultrasound examination of the thyroid gland and adjacent soft
tissues was performed.

[Series 1: us thyroid · 26 acquisitions, 14 frames shown]
[im 1/26]
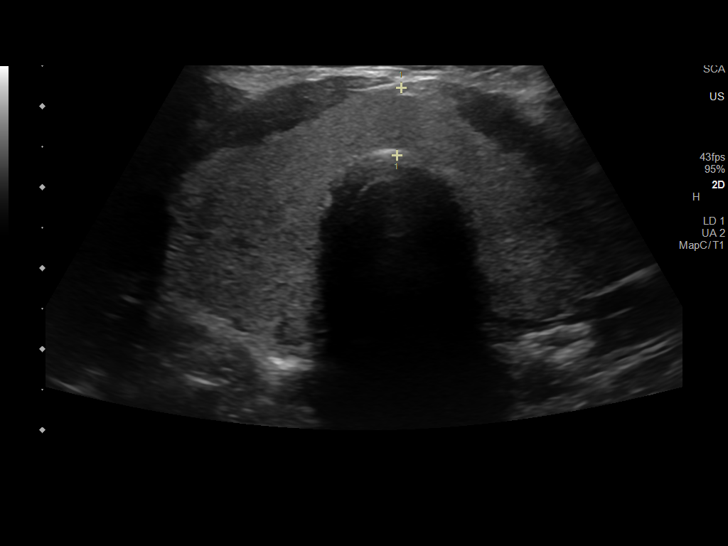
[im 3/26]
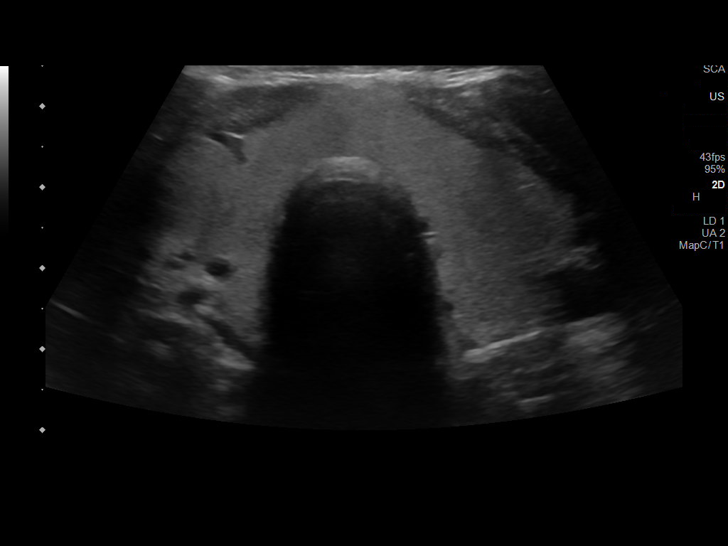
[im 5/26]
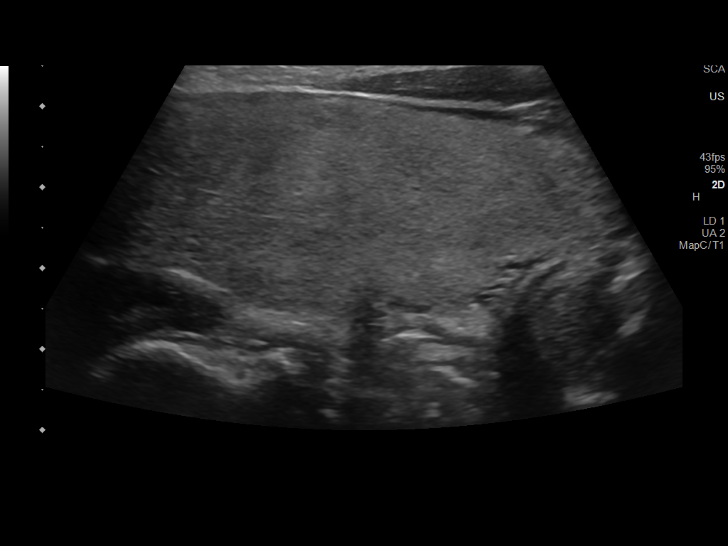
[im 7/26]
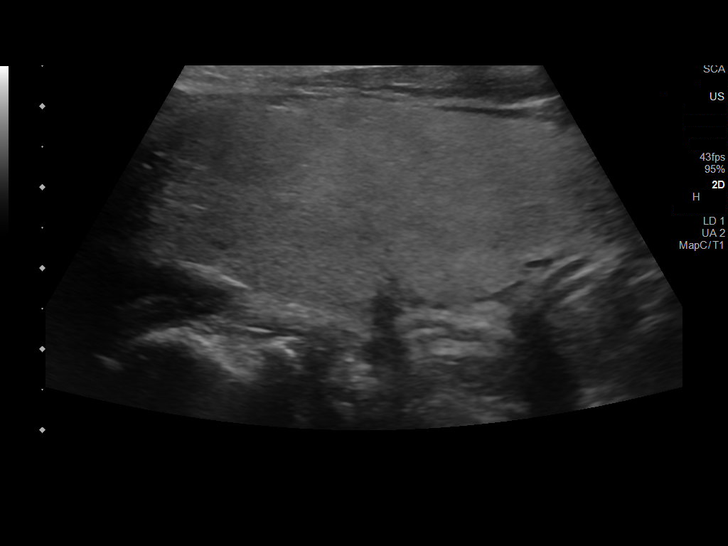
[im 9/26]
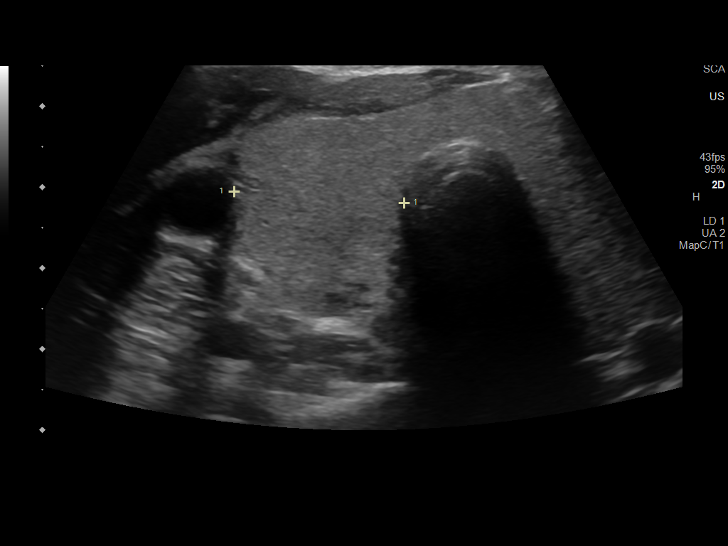
[im 10/26]
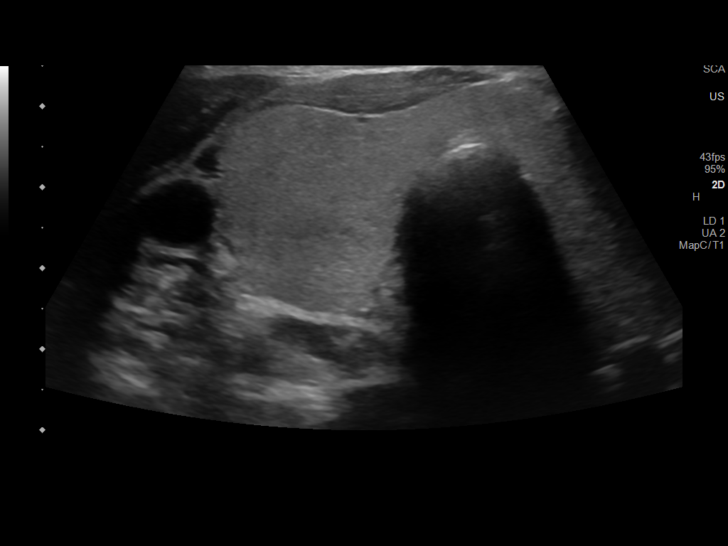
[im 12/26]
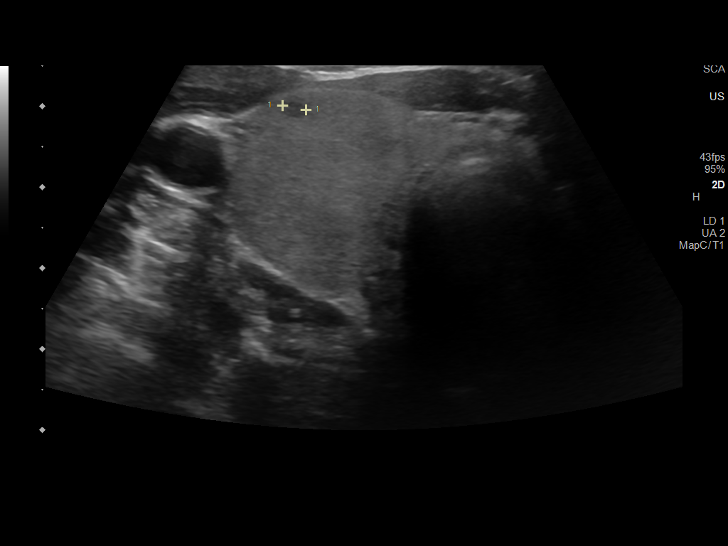
[im 14/26]
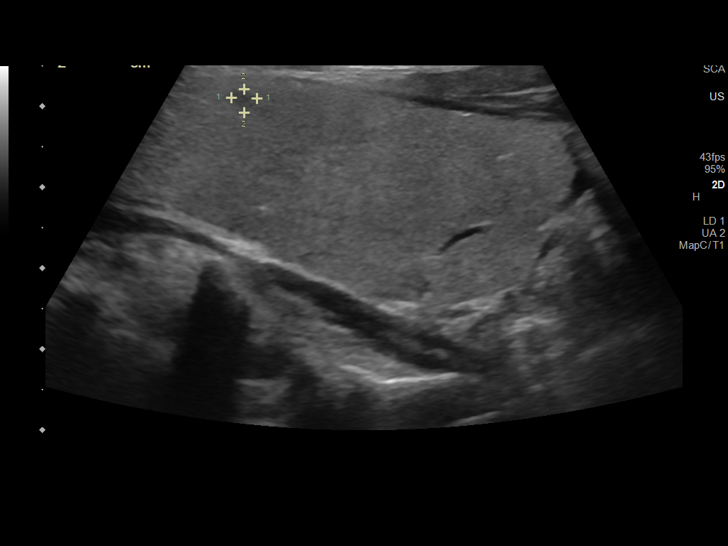
[im 16/26]
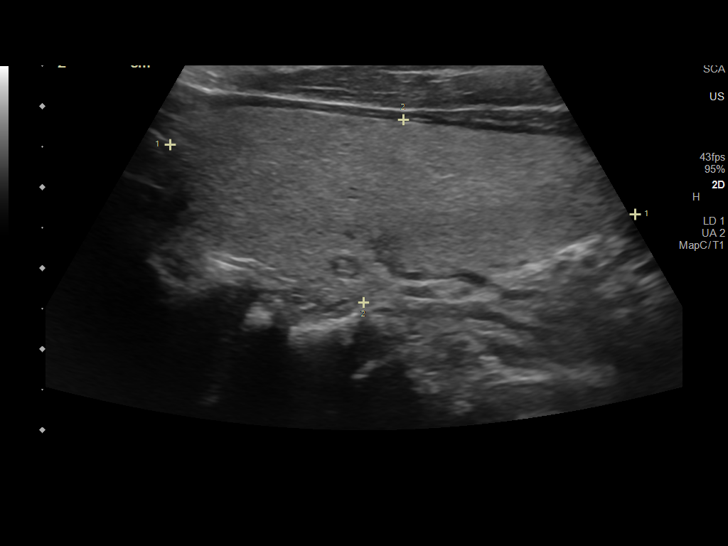
[im 17/26]
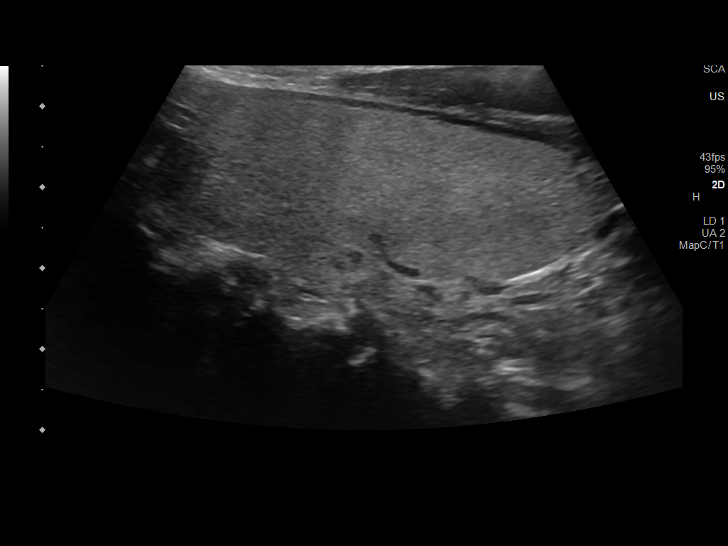
[im 19/26]
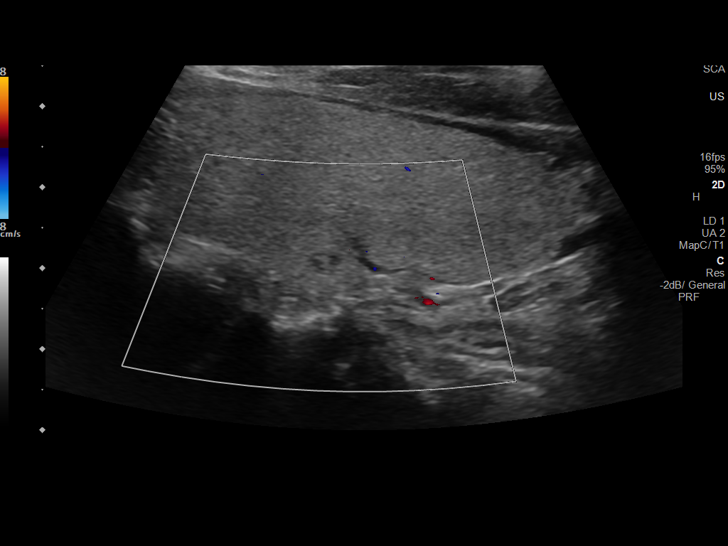
[im 21/26]
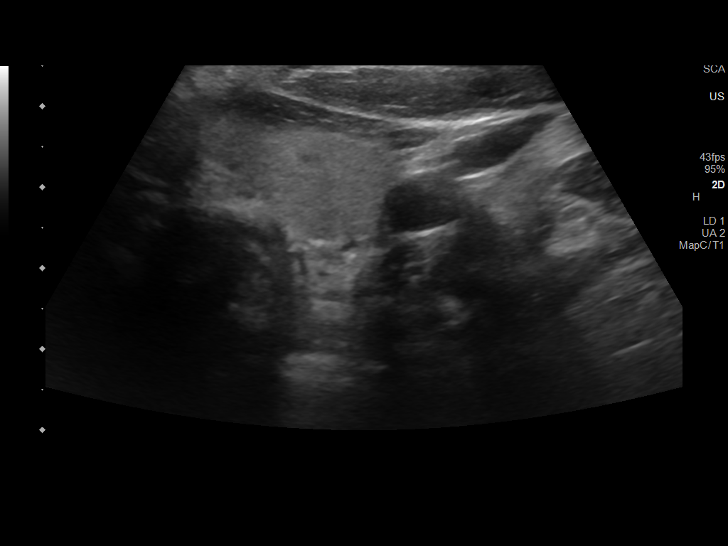
[im 23/26]
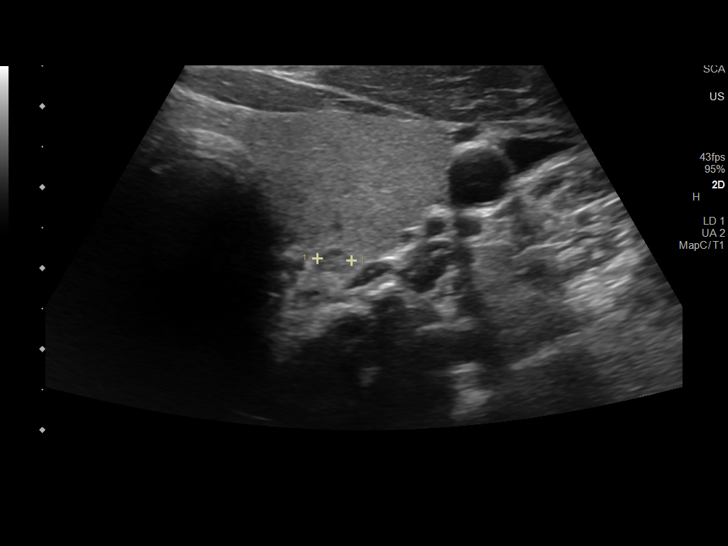
[im 26/26]
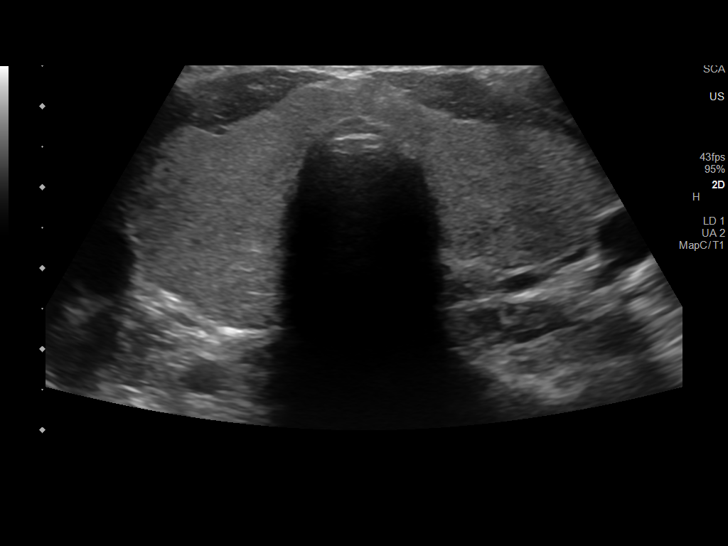

[14 of 25 positions shown; findings below may reference images not displayed]

FINDINGS: Parenchymal Echotexture: Mildly heterogenous

Isthmus: 0.8 cm

Right lobe: 6.2 x 2.6 x 2.1 cm

Left lobe: 5.8 x 2.3 x 2.2 cm

_________________________________________________________

Estimated total number of nodules >/= 1 cm: 0

Number of spongiform nodules >/=  2 cm not described below (TR1): 0

Number of mixed cystic and solid nodules >/= 1.5 cm not described
below (TR2): 0

_________________________________________________________

No discrete nodules of consequence are seen within the thyroid
gland. Tiny subcentimeter nodules bilaterally are noted incidentally
but warrant no further evaluation.
IMPRESSION: 1. Similar appearance of mildly heterogeneous and enlarged thyroid
gland.
2. No thyroid nodules that require follow-up.

The above is in keeping with the ACR TI-RADS recommendations - [HOSPITAL] 6599;[DATE].

## 2020-05-28 ENCOUNTER — Other Ambulatory Visit: Payer: Self-pay | Admitting: Endocrinology

## 2020-05-28 DIAGNOSIS — E039 Hypothyroidism, unspecified: Secondary | ICD-10-CM | POA: Diagnosis not present

## 2020-05-28 DIAGNOSIS — I10 Essential (primary) hypertension: Secondary | ICD-10-CM | POA: Diagnosis not present

## 2020-05-28 DIAGNOSIS — E049 Nontoxic goiter, unspecified: Secondary | ICD-10-CM | POA: Diagnosis not present

## 2020-05-31 ENCOUNTER — Other Ambulatory Visit (HOSPITAL_BASED_OUTPATIENT_CLINIC_OR_DEPARTMENT_OTHER): Payer: Self-pay

## 2020-05-31 MED FILL — SHINGRIX 50 MCG SUS: 50 | 1 days supply | Qty: 1 | Fill #1

## 2020-06-12 IMAGING — US US EXTREM UP*R* COMP
1 series · 11 of 11 positions shown · non-contrast
Comparison: None.

CLINICAL DATA: Right little finger mass.

EXAM:
ULTRASOUND RIGHT UPPER EXTREMITY COMPLETE
TECHNIQUE: Ultrasound examination was performed including evaluation of the
muscles, tendons, joint, and adjacent soft tissues.

[Series 1: us extrem up*right* comp · 0.03mm/px · 11 of 11 slices shown]
[im 1/11]
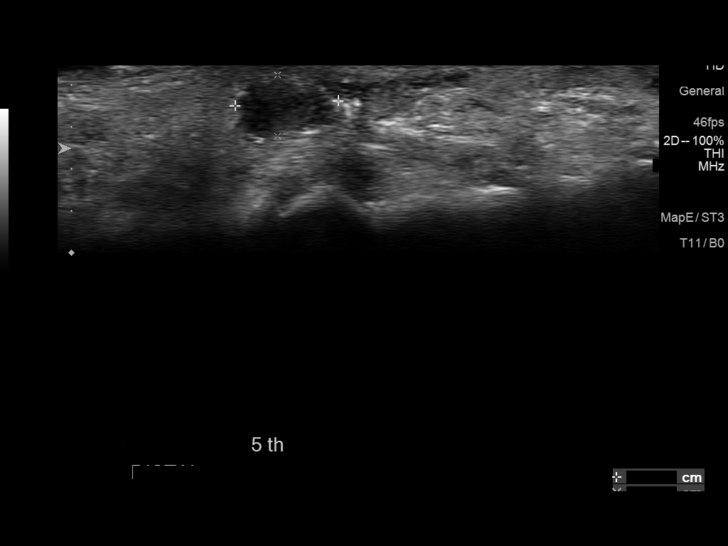
[im 2/11]
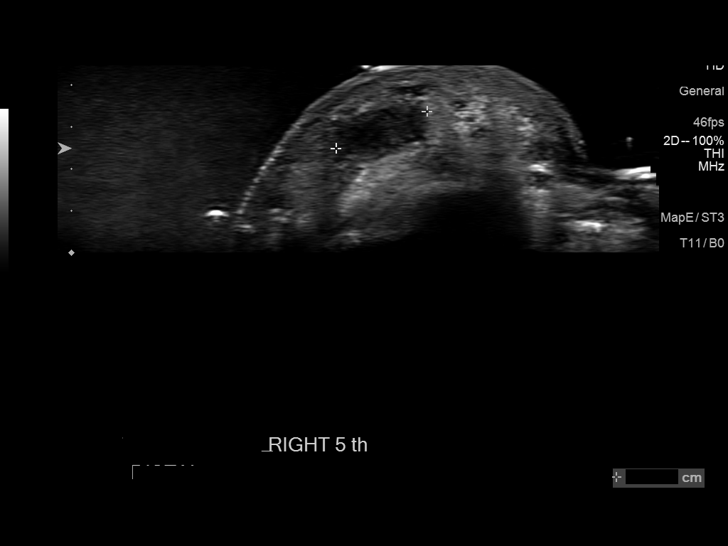
[im 3/11]
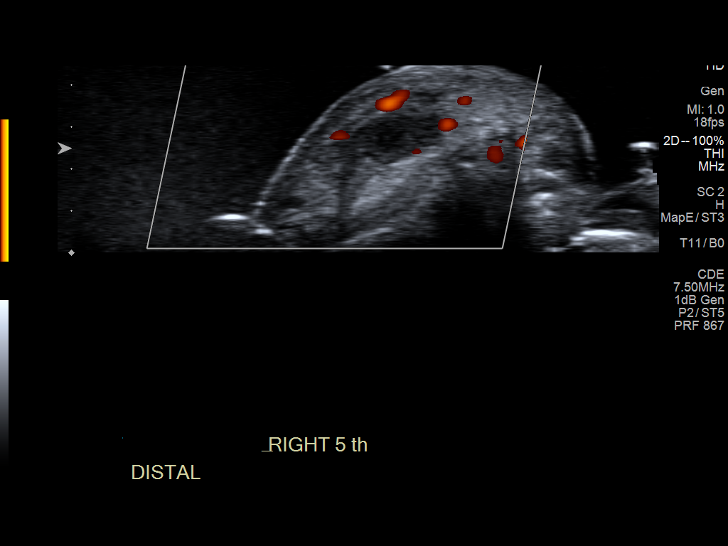
[im 4/11]
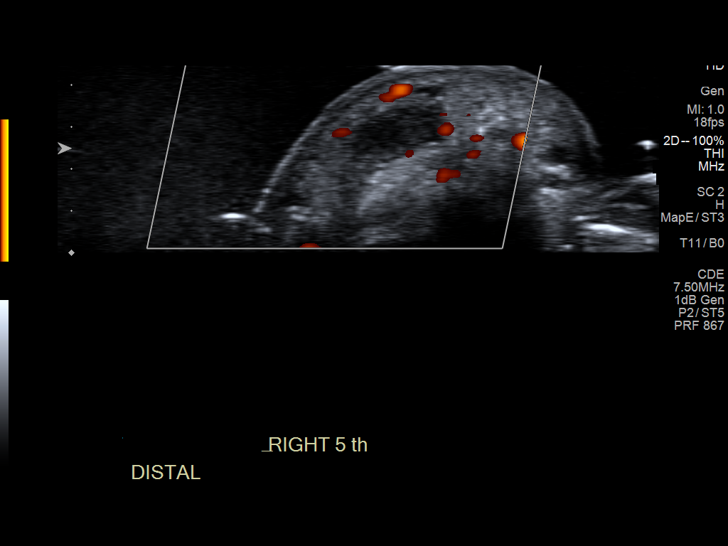
[im 5/11]
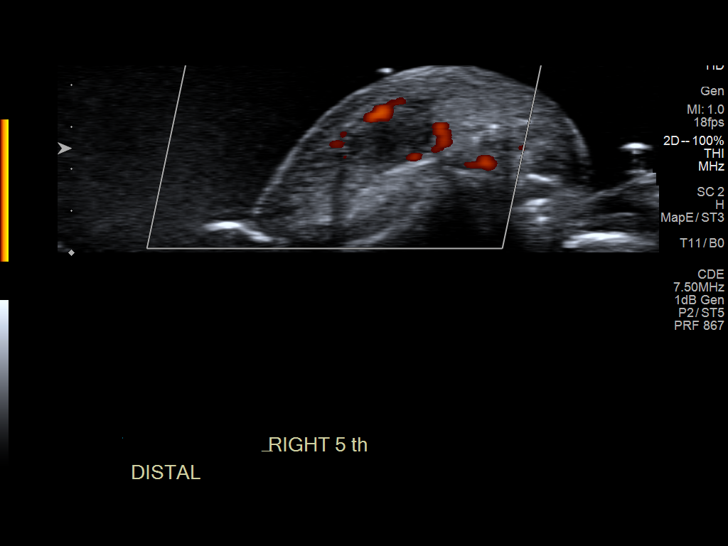
[im 6/11]
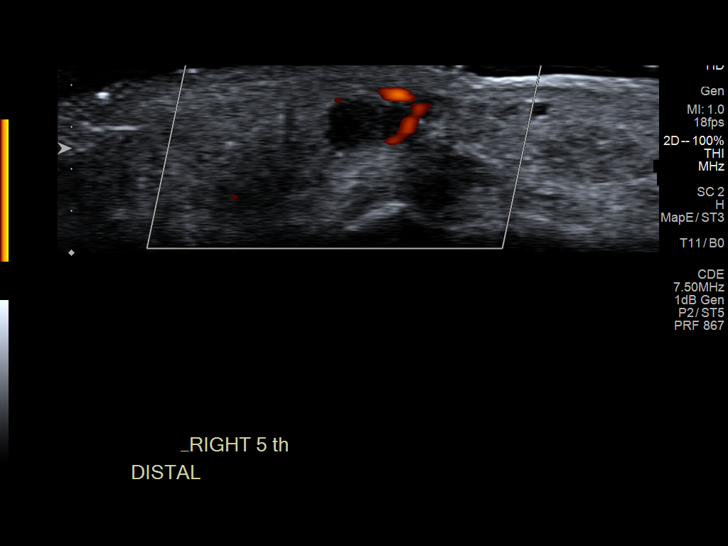
[im 7/11]
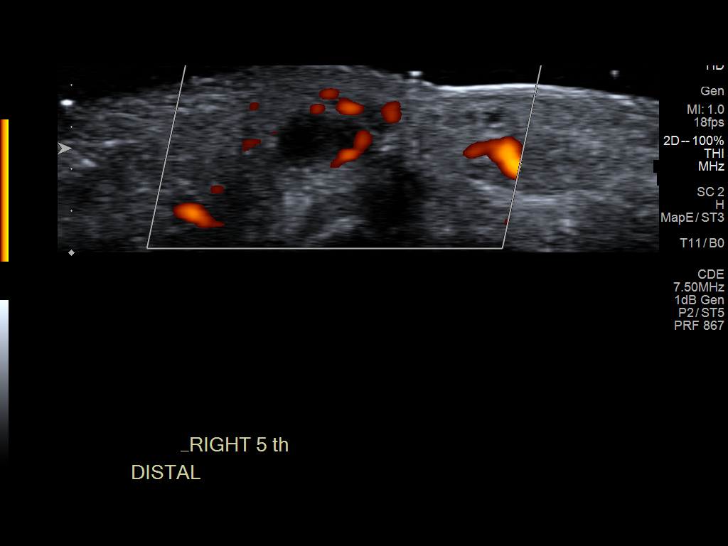
[im 8/11]
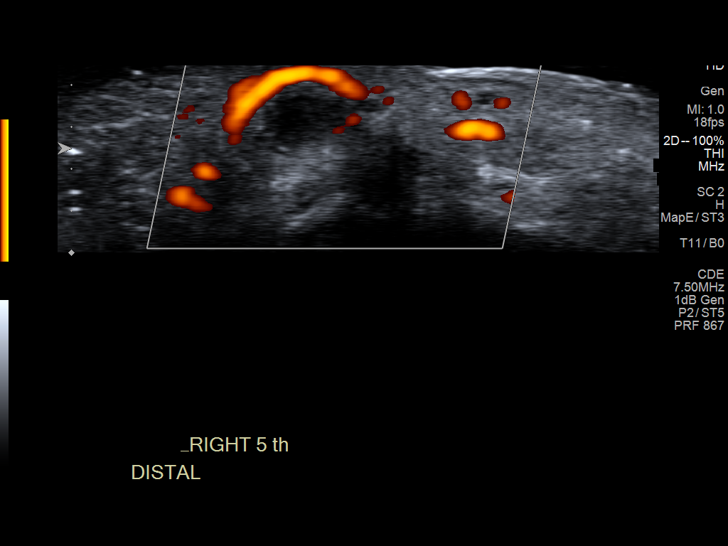
[im 9/11]
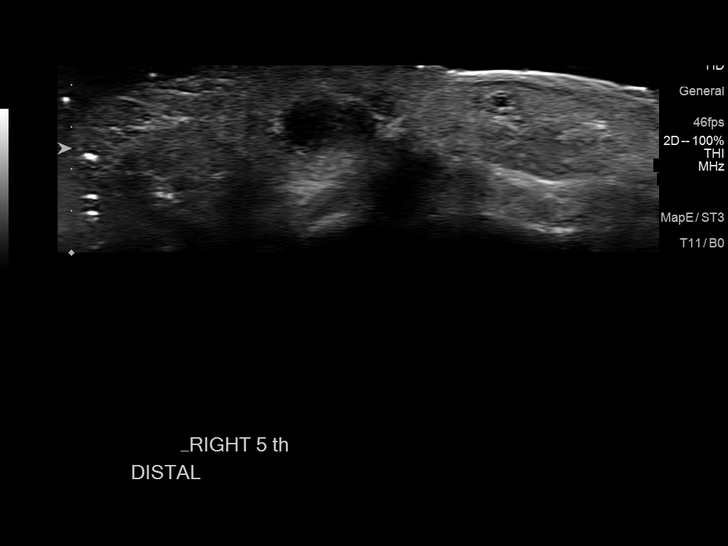
[im 10/11]
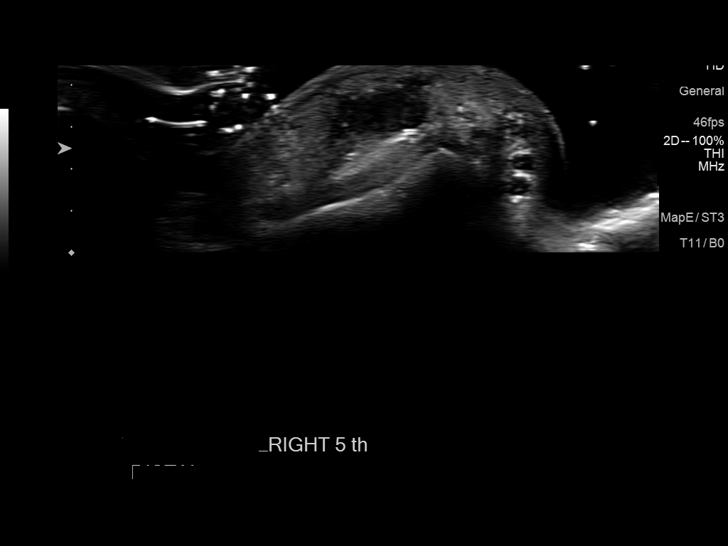
[im 11/11]
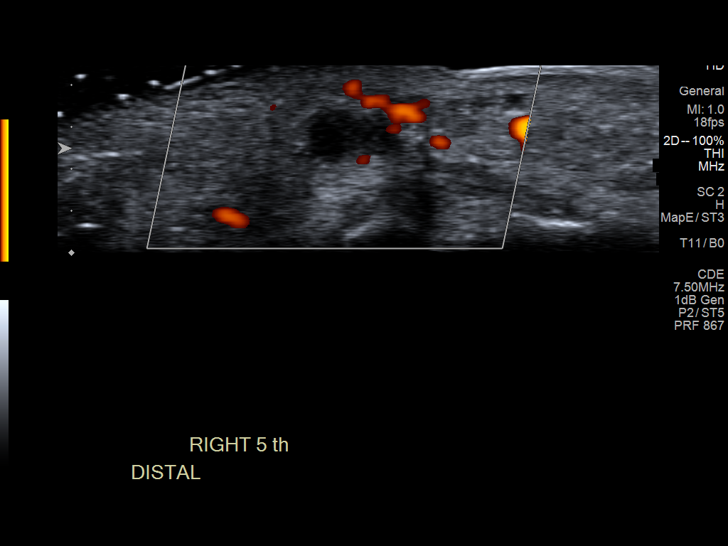

[11 of 11 positions shown; findings below may reference images not displayed]

FINDINGS: There is a 5 x 5 x 2.3 mm solid soft tissue mass superficial to the
flexor tendon adjacent to the DIP joint.

There is no DIP joint effusion. There is no abnormal vascularity in
the mass. The underlying flexor tendon appears normal.
IMPRESSION: Well-defined hypovascular soft tissue mass, likely representing a
small giant cell tumor. A complex annular ligament cyst could give
this appearance but the internal echoes are more consistent with a
giant cell tumor.

## 2020-06-23 MED FILL — Amlodipine Besylate Tab 2.5 MG (Base Equivalent): ORAL | 90 days supply | Qty: 90 | Fill #0 | Status: AC

## 2020-06-24 ENCOUNTER — Other Ambulatory Visit (HOSPITAL_COMMUNITY): Payer: Self-pay

## 2020-06-25 ENCOUNTER — Ambulatory Visit
Admission: RE | Admit: 2020-06-25 | Discharge: 2020-06-25 | Disposition: A | Discharge status: Home / Self Care | Payer: 59 | Source: Ambulatory Visit | Attending: Endocrinology | Admitting: Endocrinology

## 2020-06-25 ENCOUNTER — Other Ambulatory Visit: Payer: Self-pay

## 2020-06-25 DIAGNOSIS — E039 Hypothyroidism, unspecified: Secondary | ICD-10-CM | POA: Insufficient documentation

## 2020-07-29 MED FILL — Ergocalciferol Cap 1.25 MG (50000 Unit): ORAL | 84 days supply | Qty: 12 | Fill #0 | Status: AC

## 2020-07-30 ENCOUNTER — Other Ambulatory Visit (HOSPITAL_COMMUNITY): Payer: Self-pay

## 2020-07-31 ENCOUNTER — Other Ambulatory Visit (HOSPITAL_COMMUNITY): Payer: Self-pay

## 2020-08-15 ENCOUNTER — Other Ambulatory Visit (HOSPITAL_COMMUNITY): Payer: Self-pay

## 2020-08-15 MED FILL — Levothyroxine Sodium Tab 50 MCG: ORAL | 90 days supply | Qty: 90 | Fill #0 | Status: AC

## 2020-08-16 ENCOUNTER — Other Ambulatory Visit (HOSPITAL_COMMUNITY): Payer: Self-pay

## 2020-08-19 ENCOUNTER — Other Ambulatory Visit (HOSPITAL_COMMUNITY): Payer: Self-pay

## 2020-08-22 ENCOUNTER — Other Ambulatory Visit: Payer: Self-pay

## 2020-08-22 ENCOUNTER — Encounter: Payer: Self-pay | Admitting: Podiatry

## 2020-08-22 ENCOUNTER — Other Ambulatory Visit: Payer: Self-pay | Admitting: Internal Medicine

## 2020-08-22 ENCOUNTER — Other Ambulatory Visit (HOSPITAL_COMMUNITY): Payer: Self-pay

## 2020-08-22 ENCOUNTER — Ambulatory Visit (INDEPENDENT_AMBULATORY_CARE_PROVIDER_SITE_OTHER): Payer: 59

## 2020-08-22 ENCOUNTER — Ambulatory Visit (INDEPENDENT_AMBULATORY_CARE_PROVIDER_SITE_OTHER): Payer: 59 | Admitting: Podiatry

## 2020-08-22 DIAGNOSIS — M778 Other enthesopathies, not elsewhere classified: Secondary | ICD-10-CM | POA: Diagnosis not present

## 2020-08-22 MED ORDER — EPINEPHRINE 0.3 MG/0.3ML IJ SOAJ
0.3000 mg | INTRAMUSCULAR | 99 refills | Status: DC | PRN
  Filled 2020-08-22: qty 2, 30d supply, fill #0
  Filled 2021-06-16: qty 2, 30d supply, fill #1
  Filled 2022-07-25: qty 2, 1d supply, fill #2

## 2020-08-22 MED ORDER — METHYLPREDNISOLONE 4 MG PO TBPK
ORAL_TABLET | ORAL | 0 refills | Status: DC
  Filled 2020-08-22: qty 21, 6d supply, fill #0

## 2020-08-22 MED ORDER — TRIAMCINOLONE ACETONIDE 40 MG/ML IJ SUSP
20.0000 mg | Freq: Once | INTRAMUSCULAR | Status: AC
  Administered 2020-08-22: 20 mg

## 2020-08-23 ENCOUNTER — Other Ambulatory Visit (HOSPITAL_COMMUNITY): Payer: Self-pay

## 2020-08-24 ENCOUNTER — Other Ambulatory Visit (HOSPITAL_COMMUNITY): Payer: Self-pay

## 2020-08-24 NOTE — Progress Notes (Signed)
She presents today chief complaint of pain to the plantar forefoot.  States been aching for a few months started to develop a little callus there.  She said have been filing the callus down and using lotion.  She states that is better than it was but it still hurts and it really depends on the shoe gear.  Objective: Vital signs are stable alert and oriented x3.  I have reviewed her past medical history medications allergies surgery social history.  She currently has pain on end range of motion of the second metatarsophalangeal joint of the left foot.  Assessment: Capsulitis second metatarsophalangeal joint.  Plan: Injected the area today 20 mg Kenalog 5 mg Marcaine point of maximal tenderness.  Started her on methylprednisolone and discussed appropriate shoe gear with her.  She understands a stiff soled shoe is going to be better than anything flexible and I will follow-up with her in 1 month.  Questions or concerns she will notify us immediately.

## 2020-08-26 ENCOUNTER — Other Ambulatory Visit (HOSPITAL_COMMUNITY): Payer: Self-pay

## 2020-08-26 MED FILL — Levothyroxine Sodium Tab 25 MCG: ORAL | 90 days supply | Qty: 90 | Fill #0 | Status: AC

## 2020-09-02 MED FILL — Rosuvastatin Calcium Tab 5 MG: ORAL | 84 days supply | Qty: 36 | Fill #0 | Status: AC

## 2020-09-03 ENCOUNTER — Other Ambulatory Visit (HOSPITAL_COMMUNITY): Payer: Self-pay

## 2020-09-13 ENCOUNTER — Telehealth (INDEPENDENT_AMBULATORY_CARE_PROVIDER_SITE_OTHER): Payer: 59 | Admitting: Internal Medicine

## 2020-09-13 ENCOUNTER — Telehealth: Payer: Self-pay | Admitting: Internal Medicine

## 2020-09-13 ENCOUNTER — Encounter: Payer: Self-pay | Admitting: Internal Medicine

## 2020-09-13 ENCOUNTER — Other Ambulatory Visit: Payer: Self-pay

## 2020-09-13 VITALS — BP 126/73 | HR 91 | Temp 99.3°F | Wt 191.0 lb

## 2020-09-13 DIAGNOSIS — Z7189 Other specified counseling: Secondary | ICD-10-CM | POA: Diagnosis not present

## 2020-09-13 DIAGNOSIS — U071 COVID-19: Secondary | ICD-10-CM | POA: Diagnosis not present

## 2020-09-13 MED ORDER — ONDANSETRON HCL 4 MG PO TABS: 4.0000 mg | ORAL_TABLET | Freq: Three times a day (TID) | ORAL | 0 refills | Status: AC | PRN

## 2020-09-13 MED ORDER — BENZONATATE 100 MG PO CAPS: 100.0000 mg | ORAL_CAPSULE | Freq: Two times a day (BID) | ORAL | 0 refills | Status: DC | PRN

## 2020-09-13 NOTE — Patient Instructions (Signed)
Please take Paxlovid as directed.  Tessalon Perles and Zofran have also been prescribed for cough and nausea.  Rest and drink plenty of fluids.  Call if symptoms worsen or not improving in the next few days.

## 2020-09-13 NOTE — Telephone Encounter (Signed)
Faxed Positive Home COVID Test results to [facility] [phone], as of 09/12/2020

## 2020-09-13 NOTE — Progress Notes (Signed)
   Subjective:    Patient ID: Erin [name], female    DOB: 02-22-1966, 56 y.o.   MRN: [id number]  HPI Pleasant 55 year old Female with onset yesterday of runny nose, fever, cough and myalgias.  Temperature was up to 102.6 degrees.  Had no sore throat.  Felt nauseated.  Clear nasal drainage.  Took home COVID test and  scheduled for PCR test through [employer].  She is a Insurance account manager at [employer].  Has been told she will be out of work for 8 days.  Due to the Coronavirus pandemic she is seen today via interactive audio and video telecommunications.  She is agreeable to visit in this format today.  She is at her home and I am at my office.  She is identified using 2 identifiers as Erin [name]. [place], a patient in this practice.  She has a history of nontoxic goiter seen by Dr. Chalmers Cater, hyperlipidemia, vitamin D deficiency, benign ethnic neutropenia which is longstanding, history of migraine headaches, mild hypertension.   Review of Systems symptoms as above     Objective:   Physical Exam She looks fatigued.  Reports temperature is 99.3 degrees, pulse 91, blood pressure 126/73.  Weight 191 pounds BMI 29.04.  She is able to give a clear concise history and timeline of her symptoms.  Sounds a bit nasally congested.       Assessment & Plan:  Acute COVID-19 virus infection  Plan: Paxlovid (Nirmatrelvir 150mg  2 tabs twice daily.  Ritonavir 100 mg 1 tab twice daily).  Tessalon Perles to take up to 3 times daily as needed for cough.  Zofran 4 mg to take every 8 hours as needed for nausea.  Rest and drink plenty of fluids.  Time out of work to be determined by her employer.  Call if symptoms worsen or do not improve.

## 2020-09-13 NOTE — Telephone Encounter (Signed)
LVM to CB and schedule Video visit

## 2020-09-13 NOTE — Telephone Encounter (Signed)
Erin [name] [phone]  [name] called to say she tested positive for COVID with a home test, is going sometime today to be tested thru [facility] [employer] [facility] with PCR.  She had fever last night 102.6, headache, cough, runny nose fatigue, fever this morning is 99.3. this all started yesterday. She had a cookout on 4th where several people where in attendance, this is possible where she got this at. Would like virtual visit.

## 2020-09-24 ENCOUNTER — Other Ambulatory Visit (HOSPITAL_COMMUNITY): Payer: Self-pay

## 2020-09-25 ENCOUNTER — Telehealth: Payer: Self-pay | Admitting: Internal Medicine

## 2020-09-25 ENCOUNTER — Other Ambulatory Visit: Payer: Self-pay

## 2020-09-25 ENCOUNTER — Other Ambulatory Visit (HOSPITAL_COMMUNITY): Payer: Self-pay

## 2020-09-25 MED ORDER — BENZONATATE 100 MG PO CAPS
100.0000 mg | ORAL_CAPSULE | Freq: Two times a day (BID) | ORAL | 0 refills | Status: DC | PRN
  Filled 2020-09-25: qty 20, 10d supply, fill #0

## 2020-09-25 MED ORDER — FLUTICASONE PROPIONATE 50 MCG/ACT NA SUSP
2.0000 | Freq: Every day | NASAL | 11 refills | Status: DC
  Filled 2020-09-25: qty 48, 90d supply, fill #0
  Filled 2021-06-01: qty 48, 90d supply, fill #1

## 2020-09-25 NOTE — Telephone Encounter (Signed)
Flonase has been sent to [facility].

## 2020-09-25 NOTE — Telephone Encounter (Signed)
Erin [name] [phone]  [name] called to say she needs refill on below medication, ans she spoke with pharmacy yesterday and they said she had to call doctors office and she also tried to refill by mychart.  fluticasone (FLONASE) 50 MCG/ACT nasal spray  [facility] Phone:  [phone]  Fax:  [phone]

## 2020-10-03 ENCOUNTER — Ambulatory Visit: Payer: 59 | Admitting: Podiatry

## 2020-10-04 ENCOUNTER — Other Ambulatory Visit (HOSPITAL_COMMUNITY): Payer: Self-pay

## 2020-10-04 MED FILL — Amlodipine Besylate Tab 2.5 MG (Base Equivalent): ORAL | 90 days supply | Qty: 90 | Fill #1 | Status: AC

## 2020-10-08 ENCOUNTER — Telehealth: Payer: Self-pay | Admitting: Internal Medicine

## 2020-10-08 DIAGNOSIS — R059 Cough, unspecified: Secondary | ICD-10-CM

## 2020-10-08 NOTE — Telephone Encounter (Signed)
Erin [name] called to say she is still coughing and would like to get that Xray and come into the office on Thursday afternoon

## 2020-10-08 NOTE — Addendum Note (Signed)
Addended by: Mady Haagensen on: 10/08/2020 04:58 PM   Modules accepted: Orders

## 2020-10-08 NOTE — Telephone Encounter (Signed)
Scheduled appointment

## 2020-10-09 ENCOUNTER — Other Ambulatory Visit: Payer: Self-pay

## 2020-10-09 ENCOUNTER — Ambulatory Visit (HOSPITAL_COMMUNITY)
Admission: RE | Admit: 2020-10-09 | Discharge: 2020-10-09 | Disposition: A | Discharge status: Home / Self Care | Payer: 59 | Source: Ambulatory Visit | Attending: Internal Medicine | Admitting: Internal Medicine

## 2020-10-09 ENCOUNTER — Other Ambulatory Visit (HOSPITAL_COMMUNITY): Payer: 59

## 2020-10-09 DIAGNOSIS — R059 Cough, unspecified: Secondary | ICD-10-CM | POA: Insufficient documentation

## 2020-10-10 ENCOUNTER — Ambulatory Visit: Payer: 59 | Admitting: Internal Medicine

## 2020-10-10 ENCOUNTER — Encounter: Payer: Self-pay | Admitting: Internal Medicine

## 2020-10-10 VITALS — BP 130/90 | HR 74 | Temp 97.7°F | Ht 68.0 in | Wt 193.0 lb

## 2020-10-10 DIAGNOSIS — Z8616 Personal history of COVID-19: Secondary | ICD-10-CM | POA: Diagnosis not present

## 2020-10-10 DIAGNOSIS — R053 Chronic cough: Secondary | ICD-10-CM | POA: Diagnosis not present

## 2020-10-10 MED ORDER — HYDROCODONE BIT-HOMATROP MBR 5-1.5 MG/5ML PO SOLN
5.0000 mL | Freq: Three times a day (TID) | ORAL | 0 refills | Status: DC | PRN
Start: 2020-10-10 — End: 2021-11-25

## 2020-10-10 MED ORDER — AZITHROMYCIN 250 MG PO TABS: ORAL_TABLET | ORAL | 0 refills | Status: AC

## 2020-10-10 MED ORDER — PREDNISONE 10 MG PO TABS: ORAL_TABLET | ORAL | 0 refills | Status: DC

## 2020-10-10 MED ORDER — METHYLPREDNISOLONE ACETATE 80 MG/ML IJ SUSP
80.0000 mg | Freq: Once | INTRAMUSCULAR | Status: AC
  Administered 2020-10-10: 80 mg via INTRAMUSCULAR

## 2020-10-10 NOTE — Progress Notes (Signed)
   Subjective:    Patient ID: Erin [name], female    DOB: 1965-06-26, 55 y.o.   MRN: [id number]  HPI 55 year old Female had COVID-19 in early July.  She has recovered but had considerable fatigue with the illness.  Has noticed a residual cough that is intermittent and nonproductive but still irritating.  She is taking Tessalon Perles as needed up to twice daily and Mucinex.  She is working fine and is not short of breath.  Office visit was advised today.  Have advised chest x-ray.    Review of Systems see above     Objective:   Physical Exam Blood pressure 130/90 pulse 74 temperature 97.7 degrees pulse oximetry 97% weight 193 pounds height 5 feet 8 inches BMI 29.35  Skin: Warm and dry.  No cervical adenopathy.  TMs clear.  Pharynx is clear.  Neck is supple.  Chest is clear to auscultation without rales or wheezing.       Assessment & Plan:  Post COVID cough  Plan: Patient is reassured but we are going to get a chest x-ray for completeness.  This is not an uncommon complaint after COVID-19 virus infection.  She will be given prescription for Hycodan 1 teaspoon every 8 hours as needed for cough and a tapering course of prednisone 10 mg tablets starting with 6 tablets day 1 and decreasing by 10 mg daily i.e. 6-5-4-3-2-1 taper.  Depo-Medrol 80 mg IM given in office today.  Prescription for Zithromax Z-Pak.  Call me if not improving in 2 to 4 weeks or sooner if worse.  Have chest x-ray.

## 2020-10-28 ENCOUNTER — Other Ambulatory Visit: Payer: Self-pay | Admitting: Internal Medicine

## 2020-10-28 MED ORDER — VITAMIN D (ERGOCALCIFEROL) 1.25 MG (50000 UNIT) PO CAPS
ORAL_CAPSULE | ORAL | 1 refills | Status: DC
  Filled 2020-10-28: qty 12, 84d supply, fill #0
  Filled 2021-01-11: qty 12, 84d supply, fill #1

## 2020-10-29 ENCOUNTER — Other Ambulatory Visit (HOSPITAL_COMMUNITY): Payer: Self-pay

## 2020-10-30 ENCOUNTER — Other Ambulatory Visit (HOSPITAL_COMMUNITY): Payer: Self-pay

## 2020-11-04 ENCOUNTER — Telehealth: Payer: Self-pay | Admitting: Internal Medicine

## 2020-11-04 NOTE — Telephone Encounter (Signed)
Pt called said she is in [place] and said she hasnt taken the zpak yet and she wanted to know if she should go ahead and take it. She feels okay but she still has the cough and still has to clear her throat

## 2020-11-04 NOTE — Telephone Encounter (Signed)
Patient notified of Dr. Verlene Mayer recommendations. Patient stated she will do a COVID test.

## 2020-11-15 ENCOUNTER — Other Ambulatory Visit (HOSPITAL_COMMUNITY): Payer: Self-pay

## 2020-11-15 MED FILL — Levothyroxine Sodium Tab 25 MCG: ORAL | 90 days supply | Qty: 90 | Fill #1 | Status: AC

## 2020-11-16 NOTE — Patient Instructions (Addendum)
Take Hycodan 1 teaspoon every 8 hours as needed for cough.  Take prednisone in tapering course as directed starting with 6 x 10 mg tablets on day 1 and decreasing by 1 tablet daily.  Call me if not improving in 2 to 4 weeks or sooner if worse and have chest x-ray.  Prescription given for Zithromax Z-Pak to take on trip.  Depo-Medrol 80 mg IM.

## 2020-11-21 ENCOUNTER — Other Ambulatory Visit (HOSPITAL_COMMUNITY): Payer: Self-pay

## 2020-11-25 ENCOUNTER — Encounter: Payer: Self-pay | Admitting: Internal Medicine

## 2020-11-25 ENCOUNTER — Telehealth: Payer: Self-pay | Admitting: Internal Medicine

## 2020-11-25 ENCOUNTER — Other Ambulatory Visit (HOSPITAL_COMMUNITY): Payer: Self-pay

## 2020-11-25 MED ORDER — HYDROCORTISONE ACETATE 25 MG RE SUPP
RECTAL | 11 refills | Status: DC
  Filled 2020-11-25: qty 12, 6d supply, fill #0
  Filled 2021-05-11: qty 12, 6d supply, fill #1
  Filled 2021-06-15: qty 12, 6d supply, fill #2
  Filled 2021-06-23: qty 12, 6d supply, fill #3

## 2020-11-25 NOTE — Telephone Encounter (Signed)
Refilled Anusol Rectal suppositories to [facility] with refills per patient request. [email or web address]

## 2020-11-25 NOTE — Telephone Encounter (Signed)
Erin [name] [phone]  Erin [name] called to say she needs a refill on below medication.  hydrocortisone (ANUSOL-HC) 25 MG suppository  [place] Phone:  [phone]  Fax:  [phone]

## 2020-11-29 ENCOUNTER — Other Ambulatory Visit: Payer: Self-pay | Admitting: Internal Medicine

## 2020-11-29 DIAGNOSIS — Z1231 Encounter for screening mammogram for malignant neoplasm of breast: Secondary | ICD-10-CM

## 2020-12-07 ENCOUNTER — Other Ambulatory Visit (HOSPITAL_COMMUNITY): Payer: Self-pay

## 2020-12-07 MED FILL — Rosuvastatin Calcium Tab 5 MG: ORAL | 84 days supply | Qty: 36 | Fill #1 | Status: AC

## 2020-12-19 DIAGNOSIS — H52223 Regular astigmatism, bilateral: Secondary | ICD-10-CM | POA: Diagnosis not present

## 2020-12-19 DIAGNOSIS — H524 Presbyopia: Secondary | ICD-10-CM | POA: Diagnosis not present

## 2020-12-19 DIAGNOSIS — H5213 Myopia, bilateral: Secondary | ICD-10-CM | POA: Diagnosis not present

## 2020-12-19 DIAGNOSIS — H2513 Age-related nuclear cataract, bilateral: Secondary | ICD-10-CM | POA: Diagnosis not present

## 2020-12-19 DIAGNOSIS — G43109 Migraine with aura, not intractable, without status migrainosus: Secondary | ICD-10-CM | POA: Diagnosis not present

## 2021-01-09 ENCOUNTER — Ambulatory Visit
Admission: RE | Admit: 2021-01-09 | Discharge: 2021-01-09 | Disposition: A | Discharge status: Home / Self Care | Payer: 59 | Source: Ambulatory Visit | Attending: Internal Medicine | Admitting: Internal Medicine

## 2021-01-09 ENCOUNTER — Other Ambulatory Visit: Payer: Self-pay

## 2021-01-09 DIAGNOSIS — Z1231 Encounter for screening mammogram for malignant neoplasm of breast: Secondary | ICD-10-CM

## 2021-01-10 ENCOUNTER — Other Ambulatory Visit: Payer: Self-pay | Admitting: Internal Medicine

## 2021-01-10 ENCOUNTER — Other Ambulatory Visit (HOSPITAL_COMMUNITY): Payer: Self-pay

## 2021-01-10 MED ORDER — AMLODIPINE BESYLATE 2.5 MG PO TABS
ORAL_TABLET | Freq: Every day | ORAL | 2 refills | Status: DC
  Filled 2021-01-10: qty 90, 90d supply, fill #0
  Filled 2021-04-07: qty 90, 90d supply, fill #1
  Filled 2021-06-29: qty 90, 90d supply, fill #2

## 2021-01-11 ENCOUNTER — Other Ambulatory Visit (HOSPITAL_COMMUNITY): Payer: Self-pay

## 2021-01-13 ENCOUNTER — Other Ambulatory Visit (HOSPITAL_COMMUNITY): Payer: Self-pay

## 2021-01-23 ENCOUNTER — Other Ambulatory Visit: Payer: Self-pay

## 2021-01-23 ENCOUNTER — Other Ambulatory Visit: Payer: 59 | Admitting: Internal Medicine

## 2021-01-23 DIAGNOSIS — N6019 Diffuse cystic mastopathy of unspecified breast: Secondary | ICD-10-CM | POA: Diagnosis not present

## 2021-01-23 DIAGNOSIS — E559 Vitamin D deficiency, unspecified: Secondary | ICD-10-CM

## 2021-01-23 DIAGNOSIS — I10 Essential (primary) hypertension: Secondary | ICD-10-CM

## 2021-01-23 DIAGNOSIS — E7800 Pure hypercholesterolemia, unspecified: Secondary | ICD-10-CM | POA: Diagnosis not present

## 2021-01-23 DIAGNOSIS — Z0000 Encounter for general adult medical examination without abnormal findings: Secondary | ICD-10-CM

## 2021-01-23 DIAGNOSIS — Z6828 Body mass index (BMI) 28.0-28.9, adult: Secondary | ICD-10-CM | POA: Diagnosis not present

## 2021-01-23 DIAGNOSIS — Z1329 Encounter for screening for other suspected endocrine disorder: Secondary | ICD-10-CM | POA: Diagnosis not present

## 2021-01-23 DIAGNOSIS — Z01419 Encounter for gynecological examination (general) (routine) without abnormal findings: Secondary | ICD-10-CM | POA: Diagnosis not present

## 2021-01-23 DIAGNOSIS — N952 Postmenopausal atrophic vaginitis: Secondary | ICD-10-CM | POA: Diagnosis not present

## 2021-01-23 DIAGNOSIS — B356 Tinea cruris: Secondary | ICD-10-CM | POA: Diagnosis not present

## 2021-01-24 LAB — TSH: TSH: 0.89 mIU/L (ref 0.40–4.50)

## 2021-01-24 LAB — COMPLETE METABOLIC PANEL WITH GFR
AG Ratio: 1.6 (calc) (ref 1.0–2.5)
ALT: 9 U/L (ref 6–29)
AST: 13 U/L (ref 10–35)
Albumin: 4.1 g/dL (ref 3.6–5.1)
Alkaline phosphatase (APISO): 49 U/L (ref 37–153)
BUN: 10 mg/dL (ref 7–25)
CO2: 29 mmol/L (ref 20–32)
Calcium: 9.1 mg/dL (ref 8.6–10.4)
Chloride: 106 mmol/L (ref 98–110)
Creat: 0.68 mg/dL (ref 0.50–1.03)
Globulin: 2.6 g/dL (calc) (ref 1.9–3.7)
Glucose, Bld: 85 mg/dL (ref 65–99)
Potassium: 3.8 mmol/L (ref 3.5–5.3)
Sodium: 141 mmol/L (ref 135–146)
Total Bilirubin: 0.6 mg/dL (ref 0.2–1.2)
Total Protein: 6.7 g/dL (ref 6.1–8.1)
eGFR: 102 mL/min/{1.73_m2} (ref >=60)

## 2021-01-24 LAB — CBC WITH DIFFERENTIAL/PLATELET
Absolute Monocytes: 302 cells/uL (ref 200–950)
Basophils Absolute: 30 cells/uL (ref 0–200)
Basophils Relative: 1.1 %
Eosinophils Absolute: 59 cells/uL (ref 15–500)
Eosinophils Relative: 2.2 %
HCT: 38.2 % (ref 35.0–45.0)
Hemoglobin: 12.3 g/dL (ref 11.7–15.5)
Lymphs Abs: 1021 cells/uL (ref 850–3900)
MCH: 28.8 pg (ref 27.0–33.0)
MCHC: 32.2 g/dL (ref 32.0–36.0)
MCV: 89.5 fL (ref 80.0–100.0)
MPV: 10.4 fL (ref 7.5–12.5)
Monocytes Relative: 11.2 %
Neutro Abs: 1288 cells/uL — ABNORMAL LOW (ref 1500–7800)
Neutrophils Relative %: 47.7 %
Platelets: 169 10*3/uL (ref 140–400)
RBC: 4.27 10*6/uL (ref 3.80–5.10)
RDW: 12.5 % (ref 11.0–15.0)
Total Lymphocyte: 37.8 %
WBC: 2.7 10*3/uL — ABNORMAL LOW (ref 3.8–10.8)

## 2021-01-24 LAB — VITAMIN D 25 HYDROXY (VIT D DEFICIENCY, FRACTURES): Vit D, 25-Hydroxy: 105 ng/mL — ABNORMAL HIGH (ref 30–100)

## 2021-01-24 LAB — LIPID PANEL
Cholesterol: 151 mg/dL (ref <200)
HDL: 58 mg/dL (ref >=50)
LDL Cholesterol (Calc): 79 mg/dL (calc)
Non-HDL Cholesterol (Calc): 93 mg/dL (calc) (ref <130)
Total CHOL/HDL Ratio: 2.6 (calc) (ref <5.0)
Triglycerides: 61 mg/dL (ref <150)

## 2021-01-28 ENCOUNTER — Other Ambulatory Visit (HOSPITAL_COMMUNITY): Payer: Self-pay

## 2021-01-28 ENCOUNTER — Ambulatory Visit (INDEPENDENT_AMBULATORY_CARE_PROVIDER_SITE_OTHER): Payer: 59 | Admitting: Internal Medicine

## 2021-01-28 ENCOUNTER — Other Ambulatory Visit: Payer: Self-pay

## 2021-01-28 ENCOUNTER — Encounter: Payer: Self-pay | Admitting: Internal Medicine

## 2021-01-28 VITALS — BP 102/70 | HR 70 | Temp 98.3°F | Ht 67.5 in | Wt 193.0 lb

## 2021-01-28 DIAGNOSIS — Z8669 Personal history of other diseases of the nervous system and sense organs: Secondary | ICD-10-CM | POA: Diagnosis not present

## 2021-01-28 DIAGNOSIS — R300 Dysuria: Secondary | ICD-10-CM

## 2021-01-28 DIAGNOSIS — J309 Allergic rhinitis, unspecified: Secondary | ICD-10-CM

## 2021-01-28 DIAGNOSIS — E7800 Pure hypercholesterolemia, unspecified: Secondary | ICD-10-CM | POA: Diagnosis not present

## 2021-01-28 DIAGNOSIS — I10 Essential (primary) hypertension: Secondary | ICD-10-CM

## 2021-01-28 DIAGNOSIS — E049 Nontoxic goiter, unspecified: Secondary | ICD-10-CM

## 2021-01-28 DIAGNOSIS — K648 Other hemorrhoids: Secondary | ICD-10-CM

## 2021-01-28 DIAGNOSIS — R319 Hematuria, unspecified: Secondary | ICD-10-CM | POA: Diagnosis not present

## 2021-01-28 DIAGNOSIS — D708 Other neutropenia: Secondary | ICD-10-CM

## 2021-01-28 DIAGNOSIS — R82998 Other abnormal findings in urine: Secondary | ICD-10-CM

## 2021-01-28 DIAGNOSIS — Z0000 Encounter for general adult medical examination without abnormal findings: Secondary | ICD-10-CM

## 2021-01-28 DIAGNOSIS — Z6829 Body mass index (BMI) 29.0-29.9, adult: Secondary | ICD-10-CM | POA: Diagnosis not present

## 2021-01-28 DIAGNOSIS — Z86018 Personal history of other benign neoplasm: Secondary | ICD-10-CM

## 2021-01-28 LAB — POCT URINALYSIS DIPSTICK
Bilirubin, UA: NEGATIVE
Glucose, UA: NEGATIVE
Ketones, UA: NEGATIVE
Nitrite, UA: NEGATIVE
Protein, UA: NEGATIVE
Spec Grav, UA: 1.01 (ref 1.010–1.025)
Urobilinogen, UA: 0.2 E.U./dL
pH, UA: 6.5 (ref 5.0–8.0)

## 2021-01-28 MED ORDER — VITAMIN D (ERGOCALCIFEROL) 1.25 MG (50000 UNIT) PO CAPS
50000.0000 [IU] | ORAL_CAPSULE | ORAL | 3 refills | Status: DC
  Filled 2021-01-28 – 2021-04-12 (×2): qty 12, 84d supply, fill #0
  Filled 2021-06-29: qty 12, 84d supply, fill #1
  Filled 2021-09-25: qty 12, 84d supply, fill #2
  Filled 2021-12-18: qty 12, 84d supply, fill #3

## 2021-01-28 NOTE — Progress Notes (Signed)
   Subjective:    Patient ID: Erin [name], female    DOB: 09/13/1965, 55 y.o.   MRN: [id number]  HPI 55 year old Female seen for health maintenance exam and evaluation of medical issues.  Today her urine dipstick shows trace LE and has moderate occult blood.  Urine culture grew Morganella morganii.  She is apparently having some symptoms.  Will be treated with Cipro 250 mg twice daily for 7 days with repeat urine specimen in 10 days.  She has a history of hypothyroidism and goiter seen by Dr. Chalmers Cater.  History of COVID-19 in July 2022.  Had booster in October 2022.  Had flu vaccine through employment.  Tetanus immunization is up-to-date.  Has had Shingrix vaccines.  History of capsulitis left foot and ingrown toenail seen by Dr. Milinda Pointer in 2021.  She has a history of hyperlipidemia, vitamin D deficiency, mild neutropenia consistent with benign ethnic neutropenia which is longstanding.  She takes statin medication Crestor 5 mg 3 times a week.  Lipid panel and liver functions are normal.  History of migraine headaches longstanding stable with treatment as needed with Fioricet.  [facility] does GYN exams.  History of uterine fibroids and menorrhagia.  History of hemorrhoids treated with Anusol HC suppositories and rectal cream as needed  History of mild hypertension stable with amlodipine 2.5 mg daily  Uses Flonase nasal spray for allergic rhinitis symptoms.  Takes high-dose vitamin D supplement with history of vitamin D deficiency.  Social history: Married.  2 children, son and daughter in good health.  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  Family history: Mother healthy.  1 brother and 1 sister in good health.  Father deceased with history of hypertension and diabetes.  Review of Systems some slight dysuria at end of stream.  No hematuria.  No back pain.  No fever or chills.     Objective:   Physical Exam Blood pressure 102/70 pulse  70 regular temperature 98.3 degrees pulse oximetry 97% weight 193 pounds BMI 29.78 Skin is warm and dry.  No cervical adenopathy.  Goiter noted.  Chest clear to auscultation.  Cardiac exam: Regular rate and rhythm without ectopy or murmur.  Breasts are without masses.  Abdomen is soft nondistended without hepatosplenomegaly masses or tenderness.  Pelvic exam deferred to GYN physician.  No lower extremity pitting edema.  Neurological exam is intact without focal deficits.      Assessment & Plan:  Abnormal urine dipstick showing trace LE and moderate occult blood.  Culture will be sent.  She is having some slight dysuria at the end of stream.  Goiter followed by Dr. Chalmers Cater.  History of hypothyroidism and is on thyroid replacement medication.  TSH is normal.  Hyperlipidemia.  Takes Crestor 3 times a week and lipid panel is normal.  Mild essential hypertension stable on amlodipine.  History of hemorrhoids treated with Anusol cream as needed  History of COVID-19.  He has had booster in October.  Healthcare maintenance other immunizations are up-to-date.  Had colonoscopy in 2017 with 10-year follow-up recommended.  Mammogram ordered.  Plan: Urine culture grew Morganella morganii.  Patient will be treated with Cipro 250 mg twice a day for 7 days with repeat urine specimen with nurse visit in 10 days.  Continue current medications and follow-up in 1 year or as needed.

## 2021-01-31 ENCOUNTER — Encounter: Payer: Self-pay | Admitting: Internal Medicine

## 2021-01-31 ENCOUNTER — Telehealth: Payer: Self-pay | Admitting: Internal Medicine

## 2021-01-31 LAB — URINE CULTURE
MICRO NUMBER:: 12675506
SPECIMEN QUALITY:: ADEQUATE

## 2021-01-31 MED ORDER — CIPROFLOXACIN HCL 250 MG PO TABS: 250.0000 mg | ORAL_TABLET | Freq: Two times a day (BID) | ORAL | 0 refills | Status: AC

## 2021-01-31 NOTE — Telephone Encounter (Signed)
Patient had abnormal urine dipstick. Culture grew Morganella morgagnii. Will treat with Cipro and pt advised follow up here with nurse visit in 2 weeks. She was asymptomatic.

## 2021-02-03 ENCOUNTER — Other Ambulatory Visit (HOSPITAL_COMMUNITY): Payer: Self-pay

## 2021-02-03 MED ORDER — CIPROFLOXACIN HCL 250 MG PO TABS
250.0000 mg | ORAL_TABLET | Freq: Two times a day (BID) | ORAL | 0 refills | Status: AC
  Filled 2021-02-03 (×2): qty 8, 4d supply, fill #0

## 2021-02-04 ENCOUNTER — Other Ambulatory Visit (HOSPITAL_COMMUNITY): Payer: Self-pay

## 2021-02-05 ENCOUNTER — Other Ambulatory Visit (HOSPITAL_COMMUNITY): Payer: Self-pay

## 2021-02-12 ENCOUNTER — Other Ambulatory Visit (HOSPITAL_COMMUNITY): Payer: Self-pay

## 2021-02-12 MED FILL — Levothyroxine Sodium Tab 25 MCG: ORAL | 90 days supply | Qty: 90 | Fill #2 | Status: AC

## 2021-02-12 MED FILL — Mupirocin Oint 2%: CUTANEOUS | 30 days supply | Qty: 22 | Fill #0 | Status: AC

## 2021-02-15 ENCOUNTER — Encounter: Payer: 59 | Admitting: Nurse Practitioner

## 2021-02-15 DIAGNOSIS — N390 Urinary tract infection, site not specified: Secondary | ICD-10-CM

## 2021-02-15 NOTE — Patient Instructions (Signed)
Magda Kiel, thank you for joining Gildardo Pounds, NP for today's virtual visit.  While this provider is not your primary care provider (PCP), if your PCP is located in our provider database this encounter information will be shared with them immediately following your visit.  Consent: (Patient) Magda Kiel provided verbal consent for this virtual visit at the beginning of the encounter.  Current Medications:  Current Outpatient Medications:    albuterol (VENTOLIN HFA) 108 (90 Base) MCG/ACT inhaler, Inhale into the lungs every 6 (six) hours as needed for wheezing or shortness of breath., Disp: , Rfl:    amLODipine (NORVASC) 2.5 MG tablet, TAKE 1 TABLET BY MOUTH DAILY., Disp: 90 tablet, Rfl: 2   butalbital-acetaminophen-caffeine (FIORICET) 50-325-40 MG tablet, TAKE 1 TO 2 TABLETS BY MOUTH EVERY 4 TO 6 HOURS AS NEEDED FOR HEADACHE NO MORE THAN 6 TABLETS IN 24 HOURS, Disp: 60 tablet, Rfl: 2   diphenhydrAMINE (BENADRYL ALLERGY) 25 MG tablet, Take 25 mg by mouth at bedtime., Disp: , Rfl:    EPINEPHrine 0.3 mg/0.3 mL IJ SOAJ injection, INJECT 0.3 MLS (1 PEN) INTO THE MUSCLE AS NEEDED FOR ANAPHYLAXIS., Disp: 2 each, Rfl: PRN   fluticasone (FLONASE) 50 MCG/ACT nasal spray, Place 2 sprays into both nostrils daily., Disp: 48 g, Rfl: 11   HYDROcodone bit-homatropine (HYCODAN) 5-1.5 MG/5ML syrup, Take 5 mLs by mouth every 8 (eight) hours as needed for cough. (Patient not taking: Reported on 01/28/2021), Disp: 120 mL, Rfl: 0   hydrocortisone (ANUSOL-HC) 2.5 % rectal cream, Place 1 application rectally 2 (two) times daily., Disp: 30 g, Rfl: 5   hydrocortisone (ANUSOL-HC) 25 MG suppository, Unwrap and insert 1 suppository rectally twice daily as needed for hemorrhoid flare, Disp: 12 suppository, Rfl: 11   levothyroxine (SYNTHROID) 25 MCG tablet, TAKE 1 TABLET BY MOUTH IN THE MORNING ON AN EMPTY STOMACH, Disp: 90 tablet, Rfl: 12   Melatonin 5 MG TABS, Take 6 mg daily by mouth., Disp: , Rfl:     mupirocin ointment (BACTROBAN) 2 %, APPLY 2 TIMES DAILY, Disp: 22 g, Rfl: 96   NEOMYCIN-POLYMYXIN-HYDROCORTISONE (CORTISPORIN) 1 % SOLN OTIC solution, Apply 1-2 drops to toe BID after soaking, Disp: 10 mL, Rfl: 1   ondansetron (ZOFRAN) 4 MG tablet, Take 1 tablet (4 mg total) by mouth every 8 (eight) hours as needed for nausea or vomiting., Disp: 20 tablet, Rfl: 0   Probiotic Product (PROBIOTIC-10) CHEW, Chew by mouth., Disp: , Rfl:    rosuvastatin (CRESTOR) 5 MG tablet, TAKE 1 TABLET BY MOUTH 3 TIMES A WEEK, Disp: 36 tablet, Rfl: 3   Vitamin D, Ergocalciferol, (DRISDOL) 1.25 MG (50000 UNIT) CAPS capsule, Take 1 capsule by mouth once a week., Disp: 12 capsule, Rfl: 3   Medications ordered in this encounter:  No orders of the defined types were placed in this encounter.    *If you need refills on other medications prior to your next appointment, please contact your pharmacy*  Follow-Up: Call back or seek an in-person evaluation if the symptoms worsen or if the condition fails to improve as anticipated.  Other Instructions Follow up with PCP on Tuesday. If pain becomes unbearable or fever develops needs to be seen at urgent care.    If you have been instructed to have an in-person evaluation today at a local Urgent Care facility, please use the link below. It will take you to a list of all of our available [facility], including address, phone number and hours of  operation. Please do not delay care.  [facility]  If you or a family member do not have a primary care provider, use the link below to schedule a visit and establish care. When you choose a [facility] primary care physician or advanced practice provider, you gain a long-term partner in health. Find a Primary Care Provider  Learn more about [facility]'s in-office and virtual care options: [facility]

## 2021-02-15 NOTE — Progress Notes (Signed)
Virtual Visit Consent   Erin [name], you are scheduled for a virtual visit with a [facility] provider today.     Just as with appointments in the office, your consent must be obtained to participate.  Your consent will be active for this visit and any virtual visit you may have with one of our providers in the next 365 days.     If you have a MyChart account, a copy of this consent can be sent to you electronically.  All virtual visits are billed to your insurance company just like a traditional visit in the office.    As this is a virtual visit, video technology does not allow for your provider to perform a traditional examination.  This may limit your provider's ability to fully assess your condition.  If your provider identifies any concerns that need to be evaluated in person or the need to arrange testing (such as labs, EKG, etc.), we will make arrangements to do so.     Although advances in technology are sophisticated, we cannot ensure that it will always work on either your end or our end.  If the connection with a video visit is poor, the visit may have to be switched to a telephone visit.  With either a video or telephone visit, we are not always able to ensure that we have a secure connection.     I need to obtain your verbal consent now.   Are you willing to proceed with your visit today?    Erin [name] has provided verbal consent on 02/15/2021 for a virtual visit (video or telephone).   Gildardo Pounds, NP   Date: 02/15/2021 8:28 AM   Virtual Visit via Video Note   I, Gildardo Pounds, connected with  Erin [name]  ([id number], 14-Nov-1965) on 02/15/21 at  8:15 AM EST by a video-enabled telemedicine application and verified that I am speaking with the correct person using two identifiers.  Location: Patient: Virtual Visit Location Patient: Home Provider: Virtual Visit Location Provider: Home Office   I discussed the limitations of evaluation and management by  telemedicine and the availability of in person appointments. The patient expressed understanding and agreed to proceed.    History of Present Illness: Erin [name] is a 55 y.o. who identifies as a female who was assigned female at birth, and is being seen today for recurrent UTI symptoms.  HPI:  Reports dysuria and lower pelvic discomfort. Negative for N/V or fever. Recently completed a 7 day course of CIPRO for Morganella Morganii UTI (01-31-2021). She is scheduled for repeat UA in 3 days with her PCP. I recommended she have her urine evaluated in urgent care if her pain is unbearable which she states it is not. She will wait to see her PCP on Tuesday. She declines any analgesics for pain today.    Problems:  Patient Active Problem List   Diagnosis Date Noted   Finger mass, right 03/01/2019   Chronic benign neutropenia ([place]) 08/24/2018   Left knee pain 05/15/2014   Left shoulder pain 09/19/2013   Right knee pain 09/28/2012   Migraine headache 01/11/2011   Hemorrhoids 01/11/2011   Fibrocystic breast disease 01/11/2011   Goiter 01/11/2011   Allergic rhinitis 04/20/2009   PELVIC SPRAIN AND STRAINS 04/20/2009    Allergies:  Allergies  Allergen Reactions   Aspirin     REACTION: Hives, Wheezing   Nsaids     REACTION: Hives, Wheezing   Other  Other reaction(s): Other (See Comments) REACTION: Hives, Wheezing hives   Shellfish Allergy Other (See Comments)    hives   Medications:  Current Outpatient Medications:    albuterol (VENTOLIN HFA) 108 (90 Base) MCG/ACT inhaler, Inhale into the lungs every 6 (six) hours as needed for wheezing or shortness of breath., Disp: , Rfl:    amLODipine (NORVASC) 2.5 MG tablet, TAKE 1 TABLET BY MOUTH DAILY., Disp: 90 tablet, Rfl: 2   butalbital-acetaminophen-caffeine (FIORICET) 50-325-40 MG tablet, TAKE 1 TO 2 TABLETS BY MOUTH EVERY 4 TO 6 HOURS AS NEEDED FOR HEADACHE NO MORE THAN 6 TABLETS IN 24 HOURS, Disp: 60 tablet, Rfl: 2   diphenhydrAMINE  (BENADRYL ALLERGY) 25 MG tablet, Take 25 mg by mouth at bedtime., Disp: , Rfl:    EPINEPHrine 0.3 mg/0.3 mL IJ SOAJ injection, INJECT 0.3 MLS (1 PEN) INTO THE MUSCLE AS NEEDED FOR ANAPHYLAXIS., Disp: 2 each, Rfl: PRN   fluticasone (FLONASE) 50 MCG/ACT nasal spray, Place 2 sprays into both nostrils daily., Disp: 48 g, Rfl: 11   HYDROcodone bit-homatropine (HYCODAN) 5-1.5 MG/5ML syrup, Take 5 mLs by mouth every 8 (eight) hours as needed for cough. (Patient not taking: Reported on 01/28/2021), Disp: 120 mL, Rfl: 0   hydrocortisone (ANUSOL-HC) 2.5 % rectal cream, Place 1 application rectally 2 (two) times daily., Disp: 30 g, Rfl: 5   hydrocortisone (ANUSOL-HC) 25 MG suppository, Unwrap and insert 1 suppository rectally twice daily as needed for hemorrhoid flare, Disp: 12 suppository, Rfl: 11   levothyroxine (SYNTHROID) 25 MCG tablet, TAKE 1 TABLET BY MOUTH IN THE MORNING ON AN EMPTY STOMACH, Disp: 90 tablet, Rfl: 12   Melatonin 5 MG TABS, Take 6 mg daily by mouth., Disp: , Rfl:    mupirocin ointment (BACTROBAN) 2 %, APPLY 2 TIMES DAILY, Disp: 22 g, Rfl: 96   NEOMYCIN-POLYMYXIN-HYDROCORTISONE (CORTISPORIN) 1 % SOLN OTIC solution, Apply 1-2 drops to toe BID after soaking, Disp: 10 mL, Rfl: 1   ondansetron (ZOFRAN) 4 MG tablet, Take 1 tablet (4 mg total) by mouth every 8 (eight) hours as needed for nausea or vomiting., Disp: 20 tablet, Rfl: 0   Probiotic Product (PROBIOTIC-10) CHEW, Chew by mouth., Disp: , Rfl:    rosuvastatin (CRESTOR) 5 MG tablet, TAKE 1 TABLET BY MOUTH 3 TIMES A WEEK, Disp: 36 tablet, Rfl: 3   Vitamin D, Ergocalciferol, (DRISDOL) 1.25 MG (50000 UNIT) CAPS capsule, Take 1 capsule by mouth once a week., Disp: 12 capsule, Rfl: 3  Observations/Objective: Patient is well-developed, well-nourished in no acute distress.  Resting comfortably at home.  Head is normocephalic, atraumatic.  No labored breathing.  Speech is clear and coherent with logical content.  Patient is alert and oriented  at baseline.    Assessment and Plan: 1. Recurrent UTI Follow up with PCP on Tuesday. If pain becomes unbearable or fever develops needs to be seen at urgent care.   Follow Up Instructions: I discussed the assessment and treatment plan with the patient. The patient was provided an opportunity to ask questions and all were answered. The patient agreed with the plan and demonstrated an understanding of the instructions.  A copy of instructions were sent to the patient via MyChart unless otherwise noted below.     The patient was advised to call back or seek an in-person evaluation if the symptoms worsen or if the condition fails to improve as anticipated.  Time:  I spent 5 minutes with the patient via telehealth technology discussing the above problems/concerns.  Gildardo Pounds, NP

## 2021-02-17 ENCOUNTER — Ambulatory Visit (INDEPENDENT_AMBULATORY_CARE_PROVIDER_SITE_OTHER): Payer: 59

## 2021-02-17 ENCOUNTER — Encounter: Payer: Self-pay | Admitting: Internal Medicine

## 2021-02-17 ENCOUNTER — Other Ambulatory Visit: Payer: Self-pay

## 2021-02-17 ENCOUNTER — Telehealth: Payer: Self-pay | Admitting: Internal Medicine

## 2021-02-17 VITALS — BP 128/82 | Temp 97.3°F

## 2021-02-17 DIAGNOSIS — R300 Dysuria: Secondary | ICD-10-CM | POA: Diagnosis not present

## 2021-02-17 DIAGNOSIS — R319 Hematuria, unspecified: Secondary | ICD-10-CM

## 2021-02-17 LAB — POCT URINALYSIS DIPSTICK
Bilirubin, UA: NEGATIVE
Glucose, UA: NEGATIVE
Ketones, UA: NEGATIVE
Leukocytes, UA: NEGATIVE
Nitrite, UA: NEGATIVE
Protein, UA: NEGATIVE
Spec Grav, UA: 1.02 (ref 1.010–1.025)
Urobilinogen, UA: 0.2 E.U./dL
pH, UA: 6 (ref 5.0–8.0)

## 2021-02-17 MED ORDER — LEVOFLOXACIN 500 MG PO TABS: 500.0000 mg | ORAL_TABLET | Freq: Every day | ORAL | 0 refills | Status: DC

## 2021-02-17 MED ORDER — FLUCONAZOLE 150 MG PO TABS: 150.0000 mg | ORAL_TABLET | Freq: Every day | ORAL | 1 refills | Status: DC

## 2021-02-17 NOTE — Telephone Encounter (Signed)
Patient seen today for recheck on Morganella moganii UTI. She is still symptomatic with discomfort and dysuria. Says urine has sediment in it but that is likely not due to UTI but could be urinary crystals such as calcium oxalate. Another culture was sent. I am sending in Levaquin 500 mg daily with food for 10 days now. Culture will be pending but she is having considerable discomfort.

## 2021-02-17 NOTE — Progress Notes (Signed)
Patient here for urine dip due to dysuria after abx for UTI

## 2021-02-17 NOTE — Telephone Encounter (Signed)
Patient is asking that you send her in for a new abx. She feels discomfort and burning and she states that her urine is bubbly and there is sediment in it. She is unable to give a urine today but has an appt to do so tomorrow. Please advise. She is aware you are not in the office at this time.

## 2021-02-18 DIAGNOSIS — E039 Hypothyroidism, unspecified: Secondary | ICD-10-CM | POA: Diagnosis not present

## 2021-02-18 LAB — URINALYSIS, MICROSCOPIC ONLY
Bacteria, UA: NONE SEEN /HPF
Hyaline Cast: NONE SEEN /LPF
RBC / HPF: NONE SEEN /HPF (ref 0–2)
Squamous Epithelial / HPF: NONE SEEN /HPF
WBC, UA: NONE SEEN /HPF (ref 0–5)

## 2021-02-18 LAB — URINE CULTURE
MICRO NUMBER:: 12744662
SPECIMEN QUALITY:: ADEQUATE

## 2021-02-20 NOTE — Telephone Encounter (Signed)
Patient called back today to see if she should continue on medication and to see if her urine had been sent out for culture. I let her know it has been sent out and has not resulted yet, that it usually takes several days, and to continue medication until she is told otherwise.

## 2021-02-25 DIAGNOSIS — E039 Hypothyroidism, unspecified: Secondary | ICD-10-CM | POA: Diagnosis not present

## 2021-02-25 DIAGNOSIS — I10 Essential (primary) hypertension: Secondary | ICD-10-CM | POA: Diagnosis not present

## 2021-02-25 DIAGNOSIS — E049 Nontoxic goiter, unspecified: Secondary | ICD-10-CM | POA: Diagnosis not present

## 2021-03-03 MED FILL — Rosuvastatin Calcium Tab 5 MG: ORAL | 84 days supply | Qty: 36 | Fill #2 | Status: AC

## 2021-03-04 ENCOUNTER — Other Ambulatory Visit (HOSPITAL_COMMUNITY): Payer: Self-pay

## 2021-03-08 NOTE — Patient Instructions (Addendum)
It was a pleasure to see you today.  You will be treated with Cipro 250 mg twice daily for 7 days for UTI.  Return in 10 days with repeat urine specimen and nurse visit.  Continue current medications and follow-up in 1 year or as needed.  Immunizations are up-to-date.  Colonoscopy is up-to-date.  Mammogram is normal.  Continue high-dose vitamin D.  Order has been sent.

## 2021-03-20 ENCOUNTER — Other Ambulatory Visit (HOSPITAL_COMMUNITY): Payer: Self-pay

## 2021-04-07 ENCOUNTER — Other Ambulatory Visit (HOSPITAL_COMMUNITY): Payer: Self-pay

## 2021-04-12 ENCOUNTER — Other Ambulatory Visit (HOSPITAL_COMMUNITY): Payer: Self-pay

## 2021-05-11 ENCOUNTER — Other Ambulatory Visit: Payer: Self-pay

## 2021-05-12 ENCOUNTER — Other Ambulatory Visit (HOSPITAL_COMMUNITY): Payer: Self-pay

## 2021-05-12 ENCOUNTER — Other Ambulatory Visit: Payer: Self-pay | Admitting: Internal Medicine

## 2021-05-12 MED ORDER — LEVOTHYROXINE SODIUM 25 MCG PO TABS
ORAL_TABLET | ORAL | 4 refills | Status: DC
  Filled 2021-05-12: qty 90, 90d supply, fill #0
  Filled 2021-08-10: qty 90, 90d supply, fill #1
  Filled 2021-11-04: qty 90, 90d supply, fill #2
  Filled 2022-02-01: qty 90, 90d supply, fill #3
  Filled 2022-04-28: qty 90, 90d supply, fill #4

## 2021-05-12 MED ORDER — MUPIROCIN 2 % EX OINT
TOPICAL_OINTMENT | CUTANEOUS | 96 refills | Status: DC
  Filled 2021-05-12: qty 22, 10d supply, fill #0
  Filled 2021-08-10: qty 22, 10d supply, fill #1
  Filled 2021-12-01: qty 22, 10d supply, fill #2
  Filled 2021-12-18 – 2022-01-22 (×2): qty 22, 10d supply, fill #3
  Filled 2022-02-01: qty 22, 10d supply, fill #4
  Filled 2022-05-09: qty 22, 10d supply, fill #5

## 2021-05-31 ENCOUNTER — Other Ambulatory Visit: Payer: Self-pay | Admitting: Internal Medicine

## 2021-05-31 ENCOUNTER — Other Ambulatory Visit (HOSPITAL_COMMUNITY): Payer: Self-pay

## 2021-05-31 MED ORDER — ROSUVASTATIN CALCIUM 5 MG PO TABS
ORAL_TABLET | ORAL | 3 refills | Status: DC
  Filled 2021-05-31: qty 36, 84d supply, fill #0
  Filled 2021-08-16: qty 36, 84d supply, fill #1
  Filled 2021-11-04: qty 36, 84d supply, fill #2
  Filled 2022-02-09: qty 36, 84d supply, fill #3

## 2021-06-01 ENCOUNTER — Other Ambulatory Visit (HOSPITAL_COMMUNITY): Payer: Self-pay

## 2021-06-02 ENCOUNTER — Other Ambulatory Visit (HOSPITAL_COMMUNITY): Payer: Self-pay

## 2021-06-16 ENCOUNTER — Other Ambulatory Visit (HOSPITAL_COMMUNITY): Payer: Self-pay

## 2021-06-17 ENCOUNTER — Other Ambulatory Visit: Payer: Self-pay

## 2021-06-17 ENCOUNTER — Other Ambulatory Visit (HOSPITAL_COMMUNITY): Payer: Self-pay

## 2021-06-17 MED ORDER — HYDROCORTISONE (PERIANAL) 2.5 % EX CREA
1.0000 "application " | TOPICAL_CREAM | Freq: Two times a day (BID) | CUTANEOUS | 5 refills | Status: DC
  Filled 2021-06-17: qty 30, 10d supply, fill #0

## 2021-06-19 ENCOUNTER — Ambulatory Visit: Payer: 59 | Admitting: Internal Medicine

## 2021-06-19 ENCOUNTER — Encounter: Payer: Self-pay | Admitting: Internal Medicine

## 2021-06-19 VITALS — BP 112/82 | HR 71 | Temp 97.1°F | Ht 67.5 in | Wt 192.8 lb

## 2021-06-19 DIAGNOSIS — K644 Residual hemorrhoidal skin tags: Secondary | ICD-10-CM

## 2021-06-19 DIAGNOSIS — E7800 Pure hypercholesterolemia, unspecified: Secondary | ICD-10-CM | POA: Diagnosis not present

## 2021-06-19 DIAGNOSIS — I10 Essential (primary) hypertension: Secondary | ICD-10-CM | POA: Diagnosis not present

## 2021-06-24 ENCOUNTER — Other Ambulatory Visit (HOSPITAL_COMMUNITY): Payer: Self-pay

## 2021-06-30 ENCOUNTER — Other Ambulatory Visit (HOSPITAL_COMMUNITY): Payer: Self-pay

## 2021-07-05 NOTE — Progress Notes (Signed)
? ?  Subjective:  ? ? Patient ID: Erin [name], female    DOB: [birth year], 56 y.o.   MRN: [id number] ? ?HPI Patient sent a MyChart message today regarding a persistent hemorrhoid that would not decrease in size and she was concerned.  Appointment was offered.  She is here today to discuss this. ? ?History of essential hypertension and blood pressure is stable on amlodipine 2.5 mg daily. ? ?History of mild hypercholesterolemia treated with low-dose Crestor. ? ?Review of Systems there is no bright red blood per rectum.  No extreme pain but hemorrhoid is not getting smaller she says and that is concerning to her. ? ?   ?Objective:  ? Physical Exam ?Blood pressure 112/82 pulse 71 she is afebrile pulse oximetry 98% weight 192 pounds 12 ounces BMI 29.74 ? ?There is an external hemorrhoid that is not acutely inflamed.  Appears to be healing.  It is not bleeding.  It is not exquisitely tender. ? ? ? ?   ?Assessment & Plan:  ?External hemorrhoid-does not appear to have a clot in it as it is not exquisitely tender.  It is not bleeding.  It appears to be healing and decreasing in size. ? ?Plan: Recommend sitz bath's and Anusol HC 2.5% cream twice daily as needed until size decreases a bit more.  Try to avoid constipation.  May want to take MiraLAX.  Anuso 2.5% cream l refilled. ? ?

## 2021-07-05 NOTE — Patient Instructions (Addendum)
Blood pressure stable on current regimen.  External hemorrhoid appears to be resolving.  May continue with Anusol HC cream 2.5% as needed. CPE appt made for November. ? ?

## 2021-08-11 ENCOUNTER — Other Ambulatory Visit (HOSPITAL_COMMUNITY): Payer: Self-pay

## 2021-08-16 ENCOUNTER — Other Ambulatory Visit (HOSPITAL_COMMUNITY): Payer: Self-pay

## 2021-09-16 ENCOUNTER — Other Ambulatory Visit: Payer: Self-pay

## 2021-09-16 ENCOUNTER — Other Ambulatory Visit (HOSPITAL_COMMUNITY): Payer: Self-pay

## 2021-09-16 MED ORDER — BUTALBITAL-APAP-CAFFEINE 50-325-40 MG PO TABS
ORAL_TABLET | ORAL | 1 refills | Status: DC
  Filled 2021-09-16: qty 60, 10d supply, fill #0

## 2021-09-16 MED ORDER — BUTALBITAL-APAP-CAFFEINE 50-325-40 MG PO TABS: ORAL_TABLET | ORAL | 0 refills | Status: DC

## 2021-09-25 ENCOUNTER — Encounter: Payer: Self-pay | Admitting: Internal Medicine

## 2021-09-25 ENCOUNTER — Other Ambulatory Visit (HOSPITAL_COMMUNITY): Payer: Self-pay

## 2021-09-25 ENCOUNTER — Telehealth (INDEPENDENT_AMBULATORY_CARE_PROVIDER_SITE_OTHER): Payer: 59 | Admitting: Internal Medicine

## 2021-09-25 VITALS — BP 136/79 | HR 79 | Temp 98.6°F | Wt 193.0 lb

## 2021-09-25 DIAGNOSIS — U071 COVID-19: Secondary | ICD-10-CM

## 2021-09-25 MED ORDER — HYDROCODONE BIT-HOMATROP MBR 5-1.5 MG/5ML PO SOLN
5.0000 mL | Freq: Three times a day (TID) | ORAL | 0 refills | Status: DC | PRN
  Filled 2021-09-25: qty 120, 8d supply, fill #0

## 2021-09-25 MED ORDER — AZITHROMYCIN 250 MG PO TABS
ORAL_TABLET | ORAL | 0 refills | Status: AC
  Filled 2021-09-25: qty 6, 5d supply, fill #0

## 2021-09-25 NOTE — Progress Notes (Signed)
   Subjective:    Patient ID: Erin [name], female    DOB: [birth year], 56 y.o.   MRN: [id number]  HPI Patient seen today by interactive audio and video telecommunications having tested positive for COVID-19.  Patient attended a family reunion in [place] and received an email from family that there was COVID exposure at that event.  Yesterday she began to have headache coughing and sore throat.  She took a COVID test yesterday that was equivocal and today COVID test is positive.  She has cough, headache, nausea and runny nose.  She is agreeable to visit in this format today.  She is identified using 2 identifiers as Erin Cua. [name], a patient in this practice.  She is at her home and I am at my office.  Past medical history: She had COVID-19 in July 2022.  History of hyperlipidemia, vitamin D deficiency, migraine headaches.  She is on low-dose Crestor.  Has history of hemorrhoids treated with Anusol suppositories and Anusol rectal cream as needed.  History of hypertension treated with low-dose amlodipine.  Uses Flonase for allergic rhinitis symptoms.  History of vitamin D deficiency treated with high-dose vitamin D weekly.  Social history: Married.  2 children.  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.  Review of Systems see above      Objective:   Physical Exam Reports her blood pressure is 136/79 pulse 79 currently afebrile with temperature 98.6 degrees  Seen virtually.  She is not tachypneic.  She is able to give a clear concise history.  Currently not coughing.     Assessment & Plan:  Acute COVID-19 virus infection  Plan: May take Zofran if needed for nausea.  Rest and stay well-hydrated.  Quarantine at home for 5 days.  She prefers not to take Paxlovid but it was offered to her today.  She would like to have something for cough and have prescribed Hycodan 1 teaspoon every 8 hours as needed.  Also prescribe Zithromax Z-PAK to take 2 tabs day 1 followed  by 1 tab days 2 through 5 due to cough.  May take Tylenol for fever or headache.  Stay well-hydrated and rest.  Walk some to prevent atelectasis.  Monitor pulse oximetry.

## 2021-09-26 ENCOUNTER — Other Ambulatory Visit (HOSPITAL_COMMUNITY): Payer: Self-pay

## 2021-09-26 NOTE — Patient Instructions (Addendum)
May take Zofran if needed for nausea.  Rest and stay well-hydrated.  Quarantine at home for 5 days.  She prefers not to take Paxlovid.  Have prescribed Zithromax Z-PAK to take 2 tabs day 1 followed by 1 tab days 2 through 5.  May take Hycodan 1 teaspoon every 8 hours as needed for cough.  May take Tylenol for fever or headache.  Stay well-hydrated and rest.  Walk some to prevent atelectasis and monitor pulse oximetry.  Call back if you have questions or concerns.  We are sorry you are not feeling well.

## 2021-09-29 ENCOUNTER — Telehealth: Payer: Self-pay

## 2021-09-29 NOTE — Telephone Encounter (Signed)
Patient had her zpak sent ot her in mail. She will get it today. She has a slight cough still but no fever. She is taking the cough syrup but no need for tylenol now with a temp of 97.9. She wants to know if you think she should take the medication when she gets it today or hold onto it. She is cleared for work with mask. Feeling better but not 100% and has cough still.

## 2021-09-29 NOTE — Telephone Encounter (Signed)
Patient notified via mychart as requested.

## 2021-10-08 ENCOUNTER — Encounter: Payer: Self-pay | Admitting: Internal Medicine

## 2021-10-17 ENCOUNTER — Other Ambulatory Visit (HOSPITAL_COMMUNITY): Payer: Self-pay

## 2021-10-17 ENCOUNTER — Other Ambulatory Visit: Payer: Self-pay | Admitting: Internal Medicine

## 2021-10-17 MED ORDER — AMLODIPINE BESYLATE 2.5 MG PO TABS
ORAL_TABLET | Freq: Every day | ORAL | 2 refills | Status: DC
  Filled 2021-10-17: qty 90, 90d supply, fill #0
  Filled 2022-01-14: qty 90, 90d supply, fill #1
  Filled 2022-04-14: qty 90, 90d supply, fill #2

## 2021-10-22 ENCOUNTER — Other Ambulatory Visit: Payer: Self-pay | Admitting: Internal Medicine

## 2021-10-22 ENCOUNTER — Other Ambulatory Visit (HOSPITAL_COMMUNITY): Payer: Self-pay

## 2021-10-22 MED ORDER — FLUTICASONE PROPIONATE 50 MCG/ACT NA SUSP
2.0000 | Freq: Every day | NASAL | 11 refills | Status: DC
  Filled 2021-10-22: qty 48, 90d supply, fill #0
  Filled 2022-01-18: qty 48, 90d supply, fill #1
  Filled 2022-05-25: qty 48, 90d supply, fill #2
  Filled 2022-09-20: qty 48, 90d supply, fill #3

## 2021-11-03 ENCOUNTER — Other Ambulatory Visit (HOSPITAL_COMMUNITY): Payer: Self-pay

## 2021-11-04 ENCOUNTER — Other Ambulatory Visit (HOSPITAL_COMMUNITY): Payer: Self-pay

## 2021-11-05 ENCOUNTER — Other Ambulatory Visit (HOSPITAL_COMMUNITY): Payer: Self-pay

## 2021-11-24 ENCOUNTER — Telehealth: Payer: Self-pay | Admitting: Internal Medicine

## 2021-11-24 NOTE — Telephone Encounter (Signed)
Erin [name] called to say she has had Persistent cough since having COVID in July, she would like to come in and she you and see if she has bronchitis or what is going on. I scheduled her for tomorrow afternoon.

## 2021-11-25 ENCOUNTER — Ambulatory Visit: Payer: 59 | Admitting: Internal Medicine

## 2021-11-25 ENCOUNTER — Encounter: Payer: Self-pay | Admitting: Internal Medicine

## 2021-11-25 VITALS — BP 112/80 | HR 73 | Temp 97.3°F

## 2021-11-25 DIAGNOSIS — R053 Chronic cough: Secondary | ICD-10-CM | POA: Diagnosis not present

## 2021-11-25 DIAGNOSIS — E049 Nontoxic goiter, unspecified: Secondary | ICD-10-CM

## 2021-11-25 DIAGNOSIS — U099 Post covid-19 condition, unspecified: Secondary | ICD-10-CM

## 2021-11-25 DIAGNOSIS — I10 Essential (primary) hypertension: Secondary | ICD-10-CM

## 2021-11-25 MED ORDER — ALBUTEROL SULFATE HFA 108 (90 BASE) MCG/ACT IN AERS: 2.0000 | INHALATION_SPRAY | Freq: Four times a day (QID) | RESPIRATORY_TRACT | 99 refills | Status: DC | PRN

## 2021-11-25 MED ORDER — HYDROCODONE BIT-HOMATROP MBR 5-1.5 MG/5ML PO SOLN: 5.0000 mL | Freq: Three times a day (TID) | ORAL | 0 refills | Status: DC | PRN

## 2021-11-25 MED ORDER — METHYLPREDNISOLONE 4 MG PO TBPK: ORAL_TABLET | ORAL | 0 refills | Status: DC

## 2021-11-25 MED ORDER — AZITHROMYCIN 250 MG PO TABS: ORAL_TABLET | ORAL | 0 refills | Status: AC

## 2021-11-25 NOTE — Progress Notes (Signed)
   Subjective:    Patient ID: Erin [name], female    DOB: Jul 08, 1965, 56 y.o.   MRN: [id number]  HPI 56 year old Female seen with persistent cough since having Covid-19 in July.  No fever or chills.  At times cough is productive.  No recent URI.  She has similar issues after having COVID in July 2022.  She had a residual cough that was intermittent and nonproductive but irritating.  A prednisone taper was prescribed at that time as well as 80 mg IM Depo-Medrol and Zithromax Z-PAK.  Chest x-ray was negative in August 2022.  She sees Dr. Chalmers Cater for history of goiter.  She has a history of mild essential hypertension treated with amlodipine.  History of enlarged thyroid treated with thyroid suppression medication levothyroxine 25 mcg daily.  Follow-up with Dr. Chalmers Cater, Endocrinologist  History of allergic rhinitis.    Review of Systems see above: Denies fever, shaking chills.     Objective:   Physical Exam Blood pressure 112/80, pulse 73, temperature 97.3 by ear thermometer  Skin: Warm and dry.  No cervical adenopathy.  TMs clear.  Neck is supple.  Chest clear.  Goiter present but nontender.        Assessment & Plan:  Post viral persistent cough-started after COVID-19.  Cough is productive.  Sputum is not usually discolored.  We discussed whether or not to get a chest x-ray and she will consider it.  For now I would like for her to try a Medrol Dosepak to take in tapering course as directed starting with 6x 4 mg tablets daily and decreasing by 1 tablet daily i.e. 6-5-4-3-2-1.  May take Hycodan sparingly 1 teaspoon every 8 hours for cough suppression.  Generic Ventolin inhaler refilled.

## 2021-11-26 ENCOUNTER — Encounter: Payer: Self-pay | Admitting: Internal Medicine

## 2021-12-02 ENCOUNTER — Other Ambulatory Visit (HOSPITAL_COMMUNITY): Payer: Self-pay

## 2021-12-03 NOTE — Patient Instructions (Addendum)
I believe this is a postviral/post-COVID cough that is not unusual with COVID-19 viral infections.  She will take a tapering course of Medrol 4 mg tablets starting with 6 tablets day 1 and decreasing by 1 tablet daily.  6-5-4-3-2-1 taper.  We considered a chest x-ray and she will let me know if she wants to pursue this.  Her chest is clear today.  Albuterol inhaler refilled.  May take Hycodan sparingly for cough suppression.

## 2021-12-18 ENCOUNTER — Other Ambulatory Visit (HOSPITAL_COMMUNITY): Payer: Self-pay

## 2022-01-12 ENCOUNTER — Ambulatory Visit: Payer: 59 | Admitting: Family Medicine

## 2022-01-12 VITALS — BP 139/83 | Ht 69.0 in | Wt 193.0 lb

## 2022-01-12 DIAGNOSIS — M79671 Pain in right foot: Secondary | ICD-10-CM | POA: Diagnosis not present

## 2022-01-12 NOTE — Patient Instructions (Signed)
It was good to see you again! You have an acute strain of your posterior tibialis tendon. Arch support is important the next few days - avoid flat shoes, barefoot walking. Icing 15 minutes at a time 3-4 times a day as needed. Compression as you have been. Elevate above your heart as much as possible. I don't think you need to do rehab or get orthotics for this but if it recurs those are considerations. Follow up with me as needed.

## 2022-01-13 ENCOUNTER — Encounter: Payer: Self-pay | Admitting: Family Medicine

## 2022-01-13 NOTE — Progress Notes (Signed)
PCP: Elby Showers, MD  Subjective:   HPI: Patient is a 55 y.o. female here for right foot pain.  Patient reports she did a bootcamp Saturday morning then walked a lot that day at [employer] homecoming (over 25,000 steps). She developed swelling, pain medial and plantar right foot. She has been icing, elevating, using a compression wrap. Actually feels a lot better today. No acute injury or trauma.  Past Medical History:  Diagnosis Date   Allergy    Anemia    no meds   Asthma    as child   Complication of anesthesia    Fibrocystic breast disease    Finger mass, right    small finger   Hemorrhoids    Hypertension    Hypothyroidism    PONV (postoperative nausea and vomiting)    Thyroid disease     Current Outpatient Medications on File Prior to Visit  Medication Sig Dispense Refill   albuterol (VENTOLIN HFA) 108 (90 Base) MCG/ACT inhaler Inhale 2 puffs into the lungs every 6 (six) hours as needed for wheezing or shortness of breath. 8 g PRN   amLODipine (NORVASC) 2.5 MG tablet TAKE 1 TABLET BY MOUTH DAILY. 90 tablet 2   butalbital-acetaminophen-caffeine (FIORICET) 50-325-40 MG tablet TAKE 1 TO 2 TABLETS BY MOUTH EVERY 4 TO 6 HOURS AS NEEDED FOR HEADACHE. NO MORE THAN 6 TABLETS IN 24 HOURS 60 tablet 1   diphenhydrAMINE (BENADRYL) 25 MG tablet Take 25 mg by mouth at bedtime.     EPINEPHrine 0.3 mg/0.3 mL IJ SOAJ injection INJECT 0.3 MLS (1 PEN) INTO THE MUSCLE AS NEEDED FOR ANAPHYLAXIS. 2 each PRN   fluticasone (FLONASE) 50 MCG/ACT nasal spray Place 2 sprays into both nostrils daily. 48 g 11   HYDROcodone bit-homatropine (HYCODAN) 5-1.5 MG/5ML syrup Take 5 mLs by mouth every 8 (eight) hours as needed for cough. 120 mL 0   hydrocortisone (ANUSOL-HC) 2.5 % rectal cream Apply rectally 2 times daily as directed. 30 g 5   hydrocortisone (ANUSOL-HC) 25 MG suppository Unwrap and insert 1 suppository rectally twice daily as needed for hemorrhoid flare 12 suppository 11   levothyroxine  (SYNTHROID) 25 MCG tablet Take 1 tablet by mouth once daily each morning on an empty stomach 90 tablet 4   Melatonin 5 MG TABS Take 6 mg daily by mouth.     methylPREDNISolone (MEDROL DOSEPAK) 4 MG TBPK tablet Take in tapering course as directed 6-5-4-3-2-1 21 tablet 0   mupirocin ointment (BACTROBAN) 2 % APPLY TO AFFECTED AREA 2 TIMES DAILY 22 g 96   ondansetron (ZOFRAN) 4 MG tablet Take 1 tablet (4 mg total) by mouth every 8 (eight) hours as needed for nausea or vomiting. 20 tablet 0   Probiotic Product (PROBIOTIC-10) CHEW Chew by mouth.     rosuvastatin (CRESTOR) 5 MG tablet TAKE 1 TABLET BY MOUTH 3 TIMES A WEEK 36 tablet 3   Vitamin D, Ergocalciferol, (DRISDOL) 1.25 MG (50000 UNIT) CAPS capsule Take 1 capsule by mouth once a week. 12 capsule 3   No current facility-administered medications on file prior to visit.    Past Surgical History:  Procedure Laterality Date   BREAST CYST EXCISION Left    BREAST EXCISIONAL BIOPSY Bilateral    BREAST SURGERY     fibrocystic breast/Bil   MASS EXCISION Right 04/25/2019   Procedure: EXCISION MASS DISTAL PHALANX RIGHT SMALL FINGER;  Surgeon: Daryll Brod, MD;  Location: [facility];  Service: Orthopedics;  Laterality: Right;  IV  REGIONAL FOREARM BLOCK   TUBAL LIGATION     WISDOM TOOTH EXTRACTION      Allergies  Allergen Reactions   Aspirin     REACTION: Hives, Wheezing   Nsaids     REACTION: Hives, Wheezing   Other     Other reaction(s): Other (See Comments) REACTION: Hives, Wheezing hives   Shellfish Allergy Other (See Comments)    hives    BP 139/83   Ht '5\' 9"'$  (1.753 m)   Wt 193 lb (87.5 kg)   LMP 10/08/2015 (Approximate)   BMI 28.50 kg/m       No data to display              No data to display              Objective:  Physical Exam:  Gen: NAD, comfortable in exam room  Right foot/ankle: Mild diffuse foot swelling, more medially.  No other gross deformity, swelling, ecchymoses FROM with mild pain  internal rotation Mild TTP through course of post tib tendon.  No other tenderness. Negative ant drawer and negative talar tilt.   Negative syndesmotic compression. Thompsons test negative. NV intact distally.   Assessment & Plan:  1. Right foot pain - improved since this started.  2/2 overuse strain of post tib tendon.  Arch support, icing, compression, elevation.  F/u prn.

## 2022-01-14 ENCOUNTER — Other Ambulatory Visit (HOSPITAL_COMMUNITY): Payer: Self-pay

## 2022-01-19 ENCOUNTER — Other Ambulatory Visit (HOSPITAL_COMMUNITY): Payer: Self-pay

## 2022-01-20 ENCOUNTER — Other Ambulatory Visit (HOSPITAL_COMMUNITY): Payer: Self-pay

## 2022-01-20 DIAGNOSIS — Z01419 Encounter for gynecological examination (general) (routine) without abnormal findings: Secondary | ICD-10-CM | POA: Diagnosis not present

## 2022-01-20 DIAGNOSIS — Z1239 Encounter for other screening for malignant neoplasm of breast: Secondary | ICD-10-CM | POA: Diagnosis not present

## 2022-01-20 DIAGNOSIS — N952 Postmenopausal atrophic vaginitis: Secondary | ICD-10-CM | POA: Diagnosis not present

## 2022-01-20 DIAGNOSIS — Z124 Encounter for screening for malignant neoplasm of cervix: Secondary | ICD-10-CM | POA: Diagnosis not present

## 2022-01-20 DIAGNOSIS — Z1211 Encounter for screening for malignant neoplasm of colon: Secondary | ICD-10-CM | POA: Diagnosis not present

## 2022-01-20 DIAGNOSIS — Z6828 Body mass index (BMI) 28.0-28.9, adult: Secondary | ICD-10-CM | POA: Diagnosis not present

## 2022-01-20 DIAGNOSIS — Z1231 Encounter for screening mammogram for malignant neoplasm of breast: Secondary | ICD-10-CM | POA: Diagnosis not present

## 2022-01-20 MED ORDER — ESTRADIOL 0.1 MG/GM VA CREA
TOPICAL_CREAM | VAGINAL | 6 refills | Status: DC
Start: 2022-01-20 — End: 2023-01-27
  Filled 2022-01-20: qty 42.5, 90d supply, fill #0

## 2022-01-21 ENCOUNTER — Other Ambulatory Visit (HOSPITAL_COMMUNITY): Payer: Self-pay

## 2022-01-21 MED ORDER — ESTRADIOL 0.1 MG/GM VA CREA
1.0000 g | TOPICAL_CREAM | VAGINAL | 6 refills | Status: DC
  Filled 2022-01-21: qty 42.5, 90d supply, fill #0

## 2022-01-22 ENCOUNTER — Other Ambulatory Visit (HOSPITAL_COMMUNITY): Payer: Self-pay

## 2022-01-23 ENCOUNTER — Other Ambulatory Visit (HOSPITAL_COMMUNITY): Payer: Self-pay

## 2022-01-27 ENCOUNTER — Other Ambulatory Visit: Payer: 59

## 2022-01-27 DIAGNOSIS — E7800 Pure hypercholesterolemia, unspecified: Secondary | ICD-10-CM

## 2022-01-27 DIAGNOSIS — R5383 Other fatigue: Secondary | ICD-10-CM | POA: Diagnosis not present

## 2022-01-27 DIAGNOSIS — I10 Essential (primary) hypertension: Secondary | ICD-10-CM

## 2022-01-27 DIAGNOSIS — Z0000 Encounter for general adult medical examination without abnormal findings: Secondary | ICD-10-CM | POA: Diagnosis not present

## 2022-01-28 LAB — COMPLETE METABOLIC PANEL WITH GFR
AG Ratio: 1.5 (calc) (ref 1.0–2.5)
ALT: 10 U/L (ref 6–29)
AST: 13 U/L (ref 10–35)
Albumin: 4 g/dL (ref 3.6–5.1)
Alkaline phosphatase (APISO): 51 U/L (ref 37–153)
BUN: 11 mg/dL (ref 7–25)
CO2: 29 mmol/L (ref 20–32)
Calcium: 9.1 mg/dL (ref 8.6–10.4)
Chloride: 107 mmol/L (ref 98–110)
Creat: 0.66 mg/dL (ref 0.50–1.03)
Globulin: 2.7 g/dL (calc) (ref 1.9–3.7)
Glucose, Bld: 90 mg/dL (ref 65–99)
Potassium: 4.1 mmol/L (ref 3.5–5.3)
Sodium: 142 mmol/L (ref 135–146)
Total Bilirubin: 0.4 mg/dL (ref 0.2–1.2)
Total Protein: 6.7 g/dL (ref 6.1–8.1)
eGFR: 102 mL/min/{1.73_m2} (ref >=60)

## 2022-01-28 LAB — CBC WITH DIFFERENTIAL/PLATELET
Absolute Monocytes: 273 cells/uL (ref 200–950)
Basophils Absolute: 29 cells/uL (ref 0–200)
Basophils Relative: 1 %
Eosinophils Absolute: 212 cells/uL (ref 15–500)
Eosinophils Relative: 7.3 %
HCT: 36.7 % (ref 35.0–45.0)
Hemoglobin: 12 g/dL (ref 11.7–15.5)
Lymphs Abs: 1061 cells/uL (ref 850–3900)
MCH: 29.1 pg (ref 27.0–33.0)
MCHC: 32.7 g/dL (ref 32.0–36.0)
MCV: 89.1 fL (ref 80.0–100.0)
MPV: 9.9 fL (ref 7.5–12.5)
Monocytes Relative: 9.4 %
Neutro Abs: 1325 cells/uL — ABNORMAL LOW (ref 1500–7800)
Neutrophils Relative %: 45.7 %
Platelets: 170 10*3/uL (ref 140–400)
RBC: 4.12 10*6/uL (ref 3.80–5.10)
RDW: 12.9 % (ref 11.0–15.0)
Total Lymphocyte: 36.6 %
WBC: 2.9 10*3/uL — ABNORMAL LOW (ref 3.8–10.8)

## 2022-01-28 LAB — LIPID PANEL
Cholesterol: 156 mg/dL (ref <200)
HDL: 62 mg/dL (ref >=50)
LDL Cholesterol (Calc): 81 mg/dL (calc)
Non-HDL Cholesterol (Calc): 94 mg/dL (calc) (ref <130)
Total CHOL/HDL Ratio: 2.5 (calc) (ref <5.0)
Triglycerides: 58 mg/dL (ref <150)

## 2022-01-28 LAB — TSH: TSH: 1.41 mIU/L (ref 0.40–4.50)

## 2022-02-02 ENCOUNTER — Other Ambulatory Visit (HOSPITAL_COMMUNITY): Payer: Self-pay

## 2022-02-03 ENCOUNTER — Encounter: Payer: Self-pay | Admitting: Internal Medicine

## 2022-02-03 ENCOUNTER — Ambulatory Visit (INDEPENDENT_AMBULATORY_CARE_PROVIDER_SITE_OTHER): Payer: 59 | Admitting: Internal Medicine

## 2022-02-03 VITALS — BP 106/72 | HR 76 | Temp 98.6°F | Ht 68.25 in | Wt 194.8 lb

## 2022-02-03 DIAGNOSIS — Z86018 Personal history of other benign neoplasm: Secondary | ICD-10-CM | POA: Diagnosis not present

## 2022-02-03 DIAGNOSIS — Z8719 Personal history of other diseases of the digestive system: Secondary | ICD-10-CM | POA: Diagnosis not present

## 2022-02-03 DIAGNOSIS — Z8669 Personal history of other diseases of the nervous system and sense organs: Secondary | ICD-10-CM | POA: Diagnosis not present

## 2022-02-03 DIAGNOSIS — I10 Essential (primary) hypertension: Secondary | ICD-10-CM

## 2022-02-03 DIAGNOSIS — E7800 Pure hypercholesterolemia, unspecified: Secondary | ICD-10-CM

## 2022-02-03 DIAGNOSIS — Z6829 Body mass index (BMI) 29.0-29.9, adult: Secondary | ICD-10-CM | POA: Diagnosis not present

## 2022-02-03 DIAGNOSIS — J309 Allergic rhinitis, unspecified: Secondary | ICD-10-CM

## 2022-02-03 DIAGNOSIS — E049 Nontoxic goiter, unspecified: Secondary | ICD-10-CM

## 2022-02-03 DIAGNOSIS — Z0000 Encounter for general adult medical examination without abnormal findings: Secondary | ICD-10-CM | POA: Diagnosis not present

## 2022-02-03 LAB — POCT URINALYSIS DIPSTICK
Bilirubin, UA: NEGATIVE
Blood, UA: NEGATIVE
Glucose, UA: NEGATIVE
Ketones, UA: NEGATIVE
Leukocytes, UA: NEGATIVE
Nitrite, UA: NEGATIVE
Protein, UA: NEGATIVE
Spec Grav, UA: 1.015 (ref 1.010–1.025)
Urobilinogen, UA: 0.2 E.U./dL
pH, UA: 6.5 (ref 5.0–8.0)

## 2022-02-03 NOTE — Progress Notes (Signed)
   Subjective:    Patient ID: Erin [name], female    DOB: 03-24-1965, 56 y.o.   MRN: [id number]  HPI 56 year old Female seen for health maintenance exam and evaluation of medical issues.  She has a history of hypothyroidism and goiter seen by Dr. Chalmers Cater, Endocrinologist.  History of capsulitis left foot and ingrown toenail seen by Dr. Milinda Pointer in 2021.  She has a history of hyperlipidemia, vitamin D deficiency, mild neutropenia consistent with benign ethnic neutropenia which is longstanding.  She takes low-dose statin, Crestor 5 mg 3 times a week.  History of migraine headaches for which she takes Fioricet as needed.  Headaches are infrequent.  She is seen by [facility] for GYN exams.  History of uterine fibroids and menorrhagia.  History of hemorrhoids treated with Anusol HC suppositories and rectal cream as needed.  History of mild hypertension stable on amlodipine 2.5 mg daily.  Uses Flonase nasal spray for allergic rhinitis symptoms.  History of vitamin D deficiency treated with high-dose vitamin D supplement.  Social history: Married.  She has 2 children, a son and a daughter in good health.  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    Review of Systems no new complaints     Objective:   Physical Exam Blood pressure 106/72 pulse 76 regular temperature 98.6 degrees pulse oximetry 98% weight 194 pounds 12.8 ounces BMI 29.40  Skin: Warm and dry.  No cervical adenopathy.  Mild thyromegaly.  Thyroid is symmetrical.  TMs are clear.  Pharynx is clear.  Neck is supple.  Chest clear.  Cardiac exam: Regular rate and rhythm without ectopy.  Abdomen is soft nondistended without hepatosplenomegaly masses or tenderness.  No lower extremity pitting edema.  Brief neurological exam is intact without gross focal deficits.       Assessment & Plan:   Essential hypertension-stable on current regimen of amlodipine 2.5 mg daily  History of goiter followed by Dr.  Chalmers Cater and TSH is within normal limits on low-dose thyroid suppression therapy.  History of migraine headaches treated with Fioricet  Allergic rhinitis-treated with Flonase nasal spray  Mild hyperlipidemia treated with low-dose Crestor 3 times a week  History of uterine fibroids followed by GYN  Plan: Continue current medications and return in 1 year or as needed.

## 2022-02-09 ENCOUNTER — Other Ambulatory Visit (HOSPITAL_COMMUNITY): Payer: Self-pay

## 2022-02-10 ENCOUNTER — Other Ambulatory Visit (HOSPITAL_COMMUNITY): Payer: Self-pay

## 2022-02-13 DIAGNOSIS — H52223 Regular astigmatism, bilateral: Secondary | ICD-10-CM | POA: Diagnosis not present

## 2022-02-13 DIAGNOSIS — H5213 Myopia, bilateral: Secondary | ICD-10-CM | POA: Diagnosis not present

## 2022-02-13 DIAGNOSIS — H524 Presbyopia: Secondary | ICD-10-CM | POA: Diagnosis not present

## 2022-02-13 DIAGNOSIS — G43109 Migraine with aura, not intractable, without status migrainosus: Secondary | ICD-10-CM | POA: Diagnosis not present

## 2022-02-13 DIAGNOSIS — H2513 Age-related nuclear cataract, bilateral: Secondary | ICD-10-CM | POA: Diagnosis not present

## 2022-02-16 ENCOUNTER — Ambulatory Visit: Payer: 59 | Admitting: Family Medicine

## 2022-02-16 VITALS — BP 128/82 | Ht 68.5 in | Wt 193.1 lb

## 2022-02-16 DIAGNOSIS — M79671 Pain in right foot: Secondary | ICD-10-CM | POA: Diagnosis not present

## 2022-02-16 MED ORDER — PREDNISONE 10 MG PO TABS: ORAL_TABLET | ORAL | 0 refills | Status: DC

## 2022-02-16 NOTE — Patient Instructions (Signed)
You have posterior tibialis tendinitis. Sports insoles in supportive shoes. Avoid flat shoes and barefoot walking. Icing 15 minutes at a time 3-4 times a day as needed. Take prednisone dose pack x 6 days as we cannot do NSAIDs. Compression as you have been. Elevate above your heart as much as possible. Follow up with me in 1 month

## 2022-02-17 NOTE — Progress Notes (Signed)
PCP: Elby Showers, MD  Subjective:   HPI: Patient is a 56 y.o. female here for right foot pain.  11/6: Patient reports she did a bootcamp Saturday morning then walked a lot that day at [employer] home[employer] (over 25,000 steps). She developed swelling, pain medial and plantar right foot. She has been icing, elevating, using a compression wrap. Actually feels a lot better today. No acute injury or trauma.  12/11: Patient reports her pain has worsened since last visit. Feels primarily medial right ankle into foot. Some swelling but no bruising. No new injury. She cannot take NSAIDs though has taken prednisone in the past without side effects.  Past Medical History:  Diagnosis Date   Allergy    Anemia    no meds   Asthma    as child   Complication of anesthesia    Fibrocystic breast disease    Finger mass, right    small finger   Hemorrhoids    Hypertension    Hypothyroidism    PONV (postoperative nausea and vomiting)    Thyroid disease     Current Outpatient Medications on File Prior to Visit  Medication Sig Dispense Refill   albuterol (VENTOLIN HFA) 108 (90 Base) MCG/ACT inhaler Inhale 2 puffs into the lungs every 6 (six) hours as needed for wheezing or shortness of breath. 8 g PRN   amLODipine (NORVASC) 2.5 MG tablet TAKE 1 TABLET BY MOUTH DAILY. 90 tablet 2   butalbital-acetaminophen-caffeine (FIORICET) 50-325-40 MG tablet TAKE 1 TO 2 TABLETS BY MOUTH EVERY 4 TO 6 HOURS AS NEEDED FOR HEADACHE. NO MORE THAN 6 TABLETS IN 24 HOURS 60 tablet 1   diphenhydrAMINE (BENADRYL) 25 MG tablet Take 25 mg by mouth at bedtime.     EPINEPHrine 0.3 mg/0.3 mL IJ SOAJ injection INJECT 0.3 MLS (1 PEN) INTO THE MUSCLE AS NEEDED FOR ANAPHYLAXIS. 2 each PRN   estradiol (ESTRACE) 0.1 MG/GM vaginal cream Insert 1 gram vaginally every other day for 1- days / then twice weekly 42.5 g 6   estradiol (ESTRACE) 0.1 MG/GM vaginal cream Place 1 g vaginally 2 (two) times a week. 42.5 g 6   fluticasone  (FLONASE) 50 MCG/ACT nasal spray Place 2 sprays into both nostrils daily. 48 g 11   HYDROcodone bit-homatropine (HYCODAN) 5-1.5 MG/5ML syrup Take 5 mLs by mouth every 8 (eight) hours as needed for cough. 120 mL 0   hydrocortisone (ANUSOL-HC) 2.5 % rectal cream Apply rectally 2 times daily as directed. 30 g 5   hydrocortisone (ANUSOL-HC) 25 MG suppository Unwrap and insert 1 suppository rectally twice daily as needed for hemorrhoid flare 12 suppository 11   levothyroxine (SYNTHROID) 25 MCG tablet Take 1 tablet by mouth once daily each morning on an empty stomach 90 tablet 4   Melatonin 5 MG TABS Take 6 mg daily by mouth.     mupirocin ointment (BACTROBAN) 2 % APPLY TO AFFECTED AREA 2 TIMES DAILY 22 g 96   ondansetron (ZOFRAN) 4 MG tablet Take 1 tablet (4 mg total) by mouth every 8 (eight) hours as needed for nausea or vomiting. 20 tablet 0   Probiotic Product (PROBIOTIC-10) CHEW Chew by mouth.     rosuvastatin (CRESTOR) 5 MG tablet TAKE 1 TABLET BY MOUTH 3 TIMES A WEEK 36 tablet 3   No current facility-administered medications on file prior to visit.    Past Surgical History:  Procedure Laterality Date   BREAST CYST EXCISION Left    BREAST EXCISIONAL BIOPSY Bilateral  BREAST SURGERY     fibrocystic breast/Bil   MASS EXCISION Right 04/25/2019   Procedure: EXCISION MASS DISTAL PHALANX RIGHT SMALL FINGER;  Surgeon: Daryll Brod, MD;  Location: [place];  Service: Orthopedics;  Laterality: Right;  IV REGIONAL FOREARM BLOCK   TUBAL LIGATION     WISDOM TOOTH EXTRACTION      Allergies  Allergen Reactions   Aspirin     REACTION: Hives, Wheezing   Nsaids     REACTION: Hives, Wheezing   Other     Other reaction(s): Other (See Comments) REACTION: Hives, Wheezing hives   Shellfish Allergy Other (See Comments)    hives    BP 128/82   Ht 5' 8.5" (1.74 m)   Wt 193 lb 1.6 oz (87.6 kg)   LMP 10/08/2015 (Approximate)   BMI 28.93 kg/m       No data to display               No data to display              Objective:  Physical Exam:  Gen: NAD, comfortable in exam room  Right foot/ankle: Mild medial ankle swelling.  No other gross deformity, swelling, ecchymoses FROM with mild pain internal and external rotation (felt medially) TTP distal post tib tendon.  No other tenderness. Thompsons test negative. NV intact distally.  Limited MSK u/s right foot:  thickening of post tib tendon with mild neovascularity within tendon and about tendon sheath.  Mild increase in fluid in post tib tendon sheath.   Assessment & Plan:  1. Right foot pain - 2/2 post tib tendinopathy.  Sports insoles, icing.  Prednisone dose pack x 6 days (cannot take nsaids).  Compression, elevation. Rehab exercises provided to start in 1 week. F/u in 1 month.

## 2022-02-19 DIAGNOSIS — I10 Essential (primary) hypertension: Secondary | ICD-10-CM | POA: Diagnosis not present

## 2022-02-19 DIAGNOSIS — E039 Hypothyroidism, unspecified: Secondary | ICD-10-CM | POA: Diagnosis not present

## 2022-02-19 DIAGNOSIS — E049 Nontoxic goiter, unspecified: Secondary | ICD-10-CM | POA: Diagnosis not present

## 2022-03-03 NOTE — Patient Instructions (Signed)
It was a pleasure to see you today.  Labs are stable.  Continue current medications and follow-up in 1 year or as needed.

## 2022-03-06 ENCOUNTER — Other Ambulatory Visit (HOSPITAL_COMMUNITY): Payer: Self-pay

## 2022-03-06 ENCOUNTER — Other Ambulatory Visit: Payer: Self-pay

## 2022-03-06 ENCOUNTER — Other Ambulatory Visit: Payer: Self-pay | Admitting: Internal Medicine

## 2022-03-06 MED ORDER — VITAMIN D (ERGOCALCIFEROL) 1.25 MG (50000 UNIT) PO CAPS
50000.0000 [IU] | ORAL_CAPSULE | ORAL | 3 refills | Status: DC
  Filled 2022-03-06: qty 12, 84d supply, fill #0
  Filled 2022-05-09 – 2022-05-25 (×2): qty 12, 84d supply, fill #1
  Filled 2022-08-16: qty 12, 84d supply, fill #2
  Filled 2022-11-13: qty 12, 84d supply, fill #3

## 2022-03-18 ENCOUNTER — Ambulatory Visit (INDEPENDENT_AMBULATORY_CARE_PROVIDER_SITE_OTHER): Payer: 59 | Admitting: Family Medicine

## 2022-03-18 VITALS — BP 112/80 | Ht 68.5 in | Wt 193.0 lb

## 2022-03-18 DIAGNOSIS — M25571 Pain in right ankle and joints of right foot: Secondary | ICD-10-CM

## 2022-03-18 NOTE — Patient Instructions (Signed)
We will go ahead with an MRI of your ankle to include the navicular bone to assess for a stress fracture. Continue icing, arch supports, home exercises as you have been in the meantime. I will call you with results and next steps.

## 2022-03-19 ENCOUNTER — Encounter: Payer: Self-pay | Admitting: Family Medicine

## 2022-03-19 NOTE — Progress Notes (Signed)
PCP: Elby Showers, MD  Subjective:   HPI: Patient is a 57 y.o. female here for right foot pain.  11/6: Patient reports she did a bootcamp Saturday morning then walked a lot that day at [employer] home[employer] (over 25,000 steps). She developed swelling, pain medial and plantar right foot. She has been icing, elevating, using a compression wrap. Actually feels a lot better today. No acute injury or trauma.  12/11: Patient reports her pain has worsened since last visit. Feels primarily medial right ankle into foot. Some swelling but no bruising. No new injury. She cannot take NSAIDs though has taken prednisone in the past without side effects.  1/10: Unfortunately Erin [name] continues to struggle with medial right foot/ankle pain. She continues to wear the sports insoles with scaphoid pads. Prednisone helped a little bit. Cannot take nsaids. Pain focal medial foot just below the ankle medially. Some swelling locally. No new injury. Worse with prolonged standing like at work.  Past Medical History:  Diagnosis Date   Allergy    Anemia    no meds   Asthma    as child   Complication of anesthesia    Fibrocystic breast disease    Finger mass, right    small finger   Hemorrhoids    Hypertension    Hypothyroidism    PONV (postoperative nausea and vomiting)    Thyroid disease     Current Outpatient Medications on File Prior to Visit  Medication Sig Dispense Refill   albuterol (VENTOLIN HFA) 108 (90 Base) MCG/ACT inhaler Inhale 2 puffs into the lungs every 6 (six) hours as needed for wheezing or shortness of breath. 8 g PRN   amLODipine (NORVASC) 2.5 MG tablet TAKE 1 TABLET BY MOUTH DAILY. 90 tablet 2   butalbital-acetaminophen-caffeine (FIORICET) 50-325-40 MG tablet TAKE 1 TO 2 TABLETS BY MOUTH EVERY 4 TO 6 HOURS AS NEEDED FOR HEADACHE. NO MORE THAN 6 TABLETS IN 24 HOURS 60 tablet 1   diphenhydrAMINE (BENADRYL) 25 MG tablet Take 25 mg by mouth at bedtime.     EPINEPHrine 0.3 mg/0.3  mL IJ SOAJ injection INJECT 0.3 MLS (1 PEN) INTO THE MUSCLE AS NEEDED FOR ANAPHYLAXIS. 2 each PRN   estradiol (ESTRACE) 0.1 MG/GM vaginal cream Insert 1 gram vaginally every other day for 1- days / then twice weekly 42.5 g 6   fluticasone (FLONASE) 50 MCG/ACT nasal spray Place 2 sprays into both nostrils daily. 48 g 11   HYDROcodone bit-homatropine (HYCODAN) 5-1.5 MG/5ML syrup Take 5 mLs by mouth every 8 (eight) hours as needed for cough. 120 mL 0   hydrocortisone (ANUSOL-HC) 2.5 % rectal cream Apply rectally 2 times daily as directed. 30 g 5   hydrocortisone (ANUSOL-HC) 25 MG suppository Unwrap and insert 1 suppository rectally twice daily as needed for hemorrhoid flare 12 suppository 11   levothyroxine (SYNTHROID) 25 MCG tablet Take 1 tablet by mouth once daily each morning on an empty stomach 90 tablet 4   Melatonin 5 MG TABS Take 6 mg daily by mouth.     mupirocin ointment (BACTROBAN) 2 % APPLY TO AFFECTED AREA 2 TIMES DAILY 22 g 96   ondansetron (ZOFRAN) 4 MG tablet Take 1 tablet (4 mg total) by mouth every 8 (eight) hours as needed for nausea or vomiting. 20 tablet 0   predniSONE (DELTASONE) 10 MG tablet 6 tabs po day 1, 5 tabs po day 2, 4 tabs po day 3, 3 tabs po day 4, 2 tabs po day 5, 1 tab  po day 6 21 tablet 0   Probiotic Product (PROBIOTIC-10) CHEW Chew by mouth.     rosuvastatin (CRESTOR) 5 MG tablet TAKE 1 TABLET BY MOUTH 3 TIMES A WEEK 36 tablet 3   Vitamin D, Ergocalciferol, (DRISDOL) 1.25 MG (50000 UNIT) CAPS capsule Take 1 capsule (50,000 Units total) by mouth once a week. 12 capsule 3   No current facility-administered medications on file prior to visit.    Past Surgical History:  Procedure Laterality Date   BREAST CYST EXCISION Left    BREAST EXCISIONAL BIOPSY Bilateral    BREAST SURGERY     fibrocystic breast/Bil   MASS EXCISION Right 04/25/2019   Procedure: EXCISION MASS DISTAL PHALANX RIGHT SMALL FINGER;  Surgeon: Daryll Brod, MD;  Location: [place];   Service: Orthopedics;  Laterality: Right;  IV REGIONAL FOREARM BLOCK   TUBAL LIGATION     WISDOM TOOTH EXTRACTION      Allergies  Allergen Reactions   Aspirin     REACTION: Hives, Wheezing   Nsaids     REACTION: Hives, Wheezing   Other     Other reaction(s): Other (See Comments) REACTION: Hives, Wheezing hives   Shellfish Allergy Other (See Comments)    hives    BP 112/80   Ht 5' 8.5" (1.74 m)   Wt 193 lb (87.5 kg)   LMP 10/08/2015 (Approximate)   BMI 28.92 kg/m       No data to display              No data to display              Objective:  Physical Exam:  Gen: NAD, comfortable in exam room  Right foot/ankle: Mild medial ankle swelling.  No other gross deformity, swelling, ecchymoses FROM without pain. TTP distal post tib tendon and over navicular bone. Thompsons test negative. NV intact distally.   Assessment & Plan:  1. Right foot pain - unfortunately continues to struggle with pain medial foot/ankle over distal post tib tendon and now has navicular tenderness.  Concern for possible stress fracture of navicular vs distal tear of post tib tendon.  Will proceed with MRI.  Continue icing, arch supports, home exercises in meantime.

## 2022-03-26 ENCOUNTER — Ambulatory Visit
Admission: RE | Admit: 2022-03-26 | Discharge: 2022-03-26 | Disposition: A | Discharge status: Home / Self Care | Payer: 59 | Source: Ambulatory Visit | Attending: Family Medicine | Admitting: Family Medicine

## 2022-03-26 DIAGNOSIS — M25571 Pain in right ankle and joints of right foot: Secondary | ICD-10-CM

## 2022-03-27 ENCOUNTER — Other Ambulatory Visit: Payer: 59

## 2022-03-31 ENCOUNTER — Other Ambulatory Visit: Payer: Self-pay

## 2022-03-31 DIAGNOSIS — M25571 Pain in right ankle and joints of right foot: Secondary | ICD-10-CM

## 2022-04-13 NOTE — Therapy (Signed)
OUTPATIENT PHYSICAL THERAPY LOWER EXTREMITY EVALUATION   Patient Name: Erin [name] MRN: [id number] DOB:[birth year], 57 y.o., female Today's Date: 04/15/2022  END OF SESSION:  PT End of Session - 04/15/22 0915     Visit Number 1    Number of Visits 9    Date for PT Re-Evaluation 06/10/22    Authorization Type [facility]    PT Start Time 1700    PT Stop Time 1733    PT Time Calculation (min) 33 min    Activity Tolerance Patient tolerated treatment well    Behavior During Therapy WFL for tasks assessed/performed             Past Medical History:  Diagnosis Date   Allergy    Anemia    no meds   Asthma    as child   Complication of anesthesia    Fibrocystic breast disease    Finger mass, right    small finger   Hemorrhoids    Hypertension    Hypothyroidism    PONV (postoperative nausea and vomiting)    Thyroid disease    Past Surgical History:  Procedure Laterality Date   BREAST CYST EXCISION Left    BREAST EXCISIONAL BIOPSY Bilateral    BREAST SURGERY     fibrocystic breast/Bil   MASS EXCISION Right 04/25/2019   Procedure: EXCISION MASS DISTAL PHALANX RIGHT SMALL FINGER;  Surgeon: Daryll Brod, MD;  Location: [place];  Service: Orthopedics;  Laterality: Right;  IV REGIONAL FOREARM BLOCK   TUBAL LIGATION     WISDOM TOOTH EXTRACTION     Patient Active Problem List   Diagnosis Date Noted   Finger mass, right 03/01/2019   Chronic benign neutropenia ([place]) 08/24/2018   Left knee pain 05/15/2014   Left shoulder pain 09/19/2013   Right knee pain 09/28/2012   Migraine headache 01/11/2011   Hemorrhoids 01/11/2011   Fibrocystic breast disease 01/11/2011   Goiter 01/11/2011   Allergic rhinitis 04/20/2009   PELVIC SPRAIN AND STRAINS 04/20/2009    PCP: Elby Showers, MD  REFERRING PROVIDER: Dene Gentry, MD  REFERRING DIAG: [phone] (ICD-10-CM) - Pain in joint involving right ankle and foot   THERAPY DIAG:  Pain in right ankle and joints  of right foot - Plan: PT plan of care cert/re-cert  Unsteadiness on feet - Plan: PT plan of care cert/re-cert  Muscle weakness (generalized) - Plan: PT plan of care cert/re-cert  Rationale for Evaluation and Treatment: Rehabilitation  ONSET DATE: Chronic  SUBJECTIVE:   SUBJECTIVE STATEMENT: Pt presents to PT with roughly 3 month hx of R foot/ankle pain. Denies trauma or MOI, notes that she did a bootcamp class in November 2023 and did a lot of walking that same weekend and had pain and swelling after. Has been wearing a brace and scaphoid pads with no change in symptoms. Does get worse with prolonged standing, has some burning and discomfort with pressing to R medial ankle, denies N/T in R LE.   PERTINENT HISTORY: HTN  PAIN:  Are you having pain?  No: NPRS scale: 0/10 Worst: 5/10 at worst Pain location: R medial ankle Pain description: burning Aggravating factors: prolonged standing, exercises Relieving factors: orthotics, ice  PRECAUTIONS: None  WEIGHT BEARING RESTRICTIONS: No  FALLS:  Has patient fallen in last 6 months? No  LIVING ENVIRONMENT: Lives with: lives with their family Lives in: House/apartment  OCCUPATION: Works at [employer]: decrease pain to improve  comfort with work and exercise  OBJECTIVE:   DIAGNOSTIC FINDINGS:   See recent imaging for MRI  PATIENT SURVEYS:  FOTO: 72% function; 80% predicted   COGNITION: Overall cognitive status: Within functional limits for tasks assessed     SENSATION: WFL  EDEMA:  Circumferential: R: 34.5cm;   L: 34cm  POSTURE: rounded shoulders and forward head  PALPATION: TTP to R posterior tibialis muscle belly, insertion  LOWER EXTREMITY ROM:  Active ROM Right eval Left eval  Hip flexion    Hip extension    Hip abduction    Hip adduction    Hip internal rotation    Hip external rotation    Knee flexion    Knee extension    Ankle dorsiflexion 15 15   Ankle plantarflexion [facility] WFL  Ankle inversion    Ankle eversion     (Blank rows = not tested)  LOWER EXTREMITY MMT:  MMT Right eval Left eval  Hip flexion    Hip extension    Hip abduction    Hip adduction    Hip internal rotation    Hip external rotation    Knee flexion    Knee extension    Ankle dorsiflexion 5/5 5/5  Ankle plantarflexion 5/5 5/5  Ankle inversion 4+/5 5/5  Ankle eversion 5/5 5/5   (Blank rows = not tested)  LOWER EXTREMITY SPECIAL TESTS:  DNT  FUNCTIONAL TESTS:  SLS: R - 17 seconds; L - 30 seconds  GAIT: Distance walked: 47f Assistive device utilized: None Level of assistance: Complete [place] Comments: no deviatoins  TREATMENT: [facility] Adult PT Treatment:                                                DATE: 04/14/2022 Therapeutic Exercise: Tandem stance 2x30" each R ankle inv/ev RTB 2x10   PATIENT EDUCATION:  Education details: eval findings, FOTO, HEP, POC Person educated: Patient Education method: Explanation, Demonstration, and Handouts Education comprehension: verbalized understanding and returned demonstration  HOME EXERCISE PROGRAM: Access Code: [email or web address] URL: [email or web address] Date: 04/14/2022 Prepared by: DOctavio Manns Exercises - Tandem Stance  - 1 x daily - 7 x weekly - 2-3 reps - 30 sec hold - Seated Ankle Inversion with Resistance and Legs Crossed  - 1 x daily - 7 x weekly - 3 sets - 10 reps - red band hold - Long Sitting Ankle Eversion with Resistance  - 1 x daily - 7 x weekly - 3 sets - 10 reps - red band hold  ASSESSMENT:  CLINICAL IMPRESSION: Patient is a 57y.o. F who was seen today for physical therapy evaluation and treatment for acute on chronic R ankle pain. Physical findings are consistent with MD impression as pt has decrease in R ankle stability and inversion strength. Due to pain to palpation to R posterior tibialis and decreased strength, can not rule out possibility of dysfunction or  tendonopathy. Pt would benefit from skilled PT services working on improving strength and balance in order to decrease pain and improve comfort.  OBJECTIVE IMPAIRMENTS: decreased activity tolerance, decreased balance, decreased strength, and pain.   ACTIVITY LIMITATIONS: standing, squatting, stairs, and transfers  PARTICIPATION LIMITATIONS: shopping, community activity, occupation, and yard work  PERSONAL FACTORS: Time since onset of injury/illness/exacerbation are also affecting patient's functional outcome.   REHAB POTENTIAL: Excellent  CLINICAL DECISION MAKING: Stable/uncomplicated  EVALUATION COMPLEXITY:  Low   GOALS: Goals reviewed with patient? No  SHORT TERM GOALS: Target date: 05/05/2022   Pt will be compliant and knowledgeable with initial HEP for improved comfort and carryover Baseline: initial HEP given Goal status: INITIAL  2.  Pt will self report right ankle pain no greater than 3/10 for improved comfort and functional ability Baseline: 5/10 at worst Goal status: INITIAL   LONG TERM GOALS: Target date: 06/09/2022  Pt will improve FOTO function score to no less than 80% as proxy for functional improvement Baseline: 72% function Goal status: INITIAL   2.  Pt will self report right ankle pain no greater than 1/10 for improved comfort and functional ability Baseline: 5/10 at worst Goal status: INITIAL   3.  Pt will improve R ankle SLS to no less than 30 seconds for improved balance and functional mobility Baseline: 17 seconds Goal status: INITIAL  4.  Pt will improve R ankle inversion MMT to 5/5 with no pain during testing for improved comfort and function Baseline: 4+/5 Goal status: INITIAL  PLAN:  PT FREQUENCY: 1x/week  PT DURATION: 8 weeks  PLANNED INTERVENTIONS: Therapeutic exercises, Therapeutic activity, Neuromuscular re-education, Balance training, Gait training, Patient/Family education, Self Care, Joint mobilization, Dry Needling, Electrical  stimulation, Cryotherapy, Moist heat, Vasopneumatic device, Manual therapy, and Re-evaluation  PLAN FOR NEXT SESSION: assess HEP response, progress as able   Ward Chatters, PT 04/15/2022, 9:24 AM

## 2022-04-14 ENCOUNTER — Ambulatory Visit: Payer: 59 | Attending: Internal Medicine

## 2022-04-14 ENCOUNTER — Other Ambulatory Visit: Payer: Self-pay

## 2022-04-14 DIAGNOSIS — M25571 Pain in right ankle and joints of right foot: Secondary | ICD-10-CM | POA: Diagnosis not present

## 2022-04-14 DIAGNOSIS — M6281 Muscle weakness (generalized): Secondary | ICD-10-CM | POA: Diagnosis not present

## 2022-04-14 DIAGNOSIS — R2681 Unsteadiness on feet: Secondary | ICD-10-CM | POA: Diagnosis not present

## 2022-04-15 ENCOUNTER — Other Ambulatory Visit: Payer: Self-pay

## 2022-04-15 ENCOUNTER — Other Ambulatory Visit (HOSPITAL_COMMUNITY): Payer: Self-pay

## 2022-04-20 ENCOUNTER — Other Ambulatory Visit: Payer: Self-pay

## 2022-04-20 ENCOUNTER — Telehealth: Payer: Self-pay | Admitting: Family Medicine

## 2022-04-20 ENCOUNTER — Other Ambulatory Visit (HOSPITAL_COMMUNITY): Payer: Self-pay

## 2022-04-20 MED ORDER — PREDNISONE 10 MG (21) PO TBPK
ORAL_TABLET | ORAL | 0 refills | Status: DC
  Filled 2022-04-20: qty 21, 6d supply, fill #0
  Filled 2022-04-20: qty 21, fill #0

## 2022-04-20 MED ORDER — PREDNISONE 10 MG (21) PO TBPK
ORAL_TABLET | ORAL | 0 refills | Status: DC
  Filled 2022-04-20: qty 21, 6d supply, fill #0

## 2022-04-20 NOTE — Telephone Encounter (Signed)
-----   Message from [facility], LAT sent at 04/20/2022 10:31 AM EST ----- Regarding: FW: phone message  ----- Message ----- From: Carolyne Littles Sent: 04/20/2022   9:54 AM EST To: Jolinda Croak, LAT Subject: phone message                                  Pt was on her feet all day yesterday and her ankle is having pain and is limping. She is asking for prednisone sent to her pharmacy. She is in PT and has a month left.  You can send her a message in [place].

## 2022-04-20 NOTE — Telephone Encounter (Signed)
Will send in course of prednisone.  If this recurs we may need to consider ultrasound guided injection of that tendon sheath as next step.

## 2022-04-28 ENCOUNTER — Ambulatory Visit: Payer: 59

## 2022-04-28 DIAGNOSIS — R2681 Unsteadiness on feet: Secondary | ICD-10-CM | POA: Diagnosis not present

## 2022-04-28 DIAGNOSIS — M25571 Pain in right ankle and joints of right foot: Secondary | ICD-10-CM | POA: Diagnosis not present

## 2022-04-28 DIAGNOSIS — M6281 Muscle weakness (generalized): Secondary | ICD-10-CM | POA: Diagnosis not present

## 2022-04-28 NOTE — Therapy (Signed)
OUTPATIENT PHYSICAL THERAPY TREATMENT NOTE   Patient Name: Erin [name] MRN: [id number] DOB:29-May-1965, 57 y.o., female Today's Date: 04/28/2022  PCP: Elby Showers, MD  REFERRING PROVIDER: Dene Gentry, MD  END OF SESSION:   PT End of Session - 04/28/22 1652     Visit Number 2    Number of Visits 9    Date for PT Re-Evaluation 06/10/22    Authorization Type [employer]    PT Start Time 1700    PT Stop Time 1740    PT Time Calculation (min) 40 min    Activity Tolerance Patient tolerated treatment well    Behavior During Therapy WFL for tasks assessed/performed             Past Medical History:  Diagnosis Date   Allergy    Anemia    no meds   Asthma    as child   Complication of anesthesia    Fibrocystic breast disease    Finger mass, right    small finger   Hemorrhoids    Hypertension    Hypothyroidism    PONV (postoperative nausea and vomiting)    Thyroid disease    Past Surgical History:  Procedure Laterality Date   BREAST CYST EXCISION Left    BREAST EXCISIONAL BIOPSY Bilateral    BREAST SURGERY     fibrocystic breast/Bil   MASS EXCISION Right 04/25/2019   Procedure: EXCISION MASS DISTAL PHALANX RIGHT SMALL FINGER;  Surgeon: Daryll Brod, MD;  Location: [place];  Service: Orthopedics;  Laterality: Right;  IV REGIONAL FOREARM BLOCK   TUBAL LIGATION     WISDOM TOOTH EXTRACTION     Patient Active Problem List   Diagnosis Date Noted   Finger mass, right 03/01/2019   Chronic benign neutropenia ([place]) 08/24/2018   Left knee pain 05/15/2014   Left shoulder pain 09/19/2013   Right knee pain 09/28/2012   Migraine headache 01/11/2011   Hemorrhoids 01/11/2011   Fibrocystic breast disease 01/11/2011   Goiter 01/11/2011   Allergic rhinitis 04/20/2009   PELVIC SPRAIN AND STRAINS 04/20/2009    REFERRING DIAG: M25.571 (ICD-10-CM) - Pain in joint involving right ankle and foot    THERAPY DIAG:  Pain in right ankle and joints of right  foot  Unsteadiness on feet  Muscle weakness (generalized)  Rationale for Evaluation and Treatment Rehabilitation  PERTINENT HISTORY: HTN  PRECAUTIONS: None  SUBJECTIVE:                                                                                                                                                                                      SUBJECTIVE STATEMENT:  Patient reports HEP compliance, has been on prednisone, and feels these things are helping.   PAIN:  Are you having pain?  No: NPRS scale:  0-3/10 Worst: 5/10 at worst Pain location: R medial ankle Pain description: burning Aggravating factors: prolonged standing, exercises Relieving factors: orthotics, ice   OBJECTIVE: (objective measures completed at initial evaluation unless otherwise dated)   DIAGNOSTIC FINDINGS:             See recent imaging for MRI   PATIENT SURVEYS:  FOTO: 72% function; 80% predicted    COGNITION: Overall cognitive status: Within functional limits for tasks assessed                         SENSATION: WFL   EDEMA:  Circumferential: R: 34.5cm;   L: 34cm   POSTURE: rounded shoulders and forward head   PALPATION: TTP to R posterior tibialis muscle belly, insertion   LOWER EXTREMITY ROM:   Active ROM Right eval Left eval  Hip flexion      Hip extension      Hip abduction      Hip adduction      Hip internal rotation      Hip external rotation      Knee flexion      Knee extension      Ankle dorsiflexion 15 15  Ankle plantarflexion [facility] WFL  Ankle inversion      Ankle eversion       (Blank rows = not tested)   LOWER EXTREMITY MMT:   MMT Right eval Left eval  Hip flexion      Hip extension      Hip abduction      Hip adduction      Hip internal rotation      Hip external rotation      Knee flexion      Knee extension      Ankle dorsiflexion 5/5 5/5  Ankle plantarflexion 5/5 5/5  Ankle inversion 4+/5 5/5  Ankle eversion 5/5 5/5   (Blank rows = not  tested)   LOWER EXTREMITY SPECIAL TESTS:  DNT   FUNCTIONAL TESTS:  SLS: R - 17 seconds; L - 30 seconds   GAIT: Distance walked: 20f Assistive device utilized: None Level of assistance: Complete Independence Comments: no deviatoins   TREATMENT: [facility] Adult PT Treatment:                                                DATE: 04/28/22 Therapeutic Exercise: Bike level 3 x 5 mins Heel raise with tennis ball btw heels 2x10 Toe raise back against wall 2x10 4 way ankle RTB x10 each Rt Slant board gastroc stretch x1' Neuromuscular re-ed: Tandem stance x30" BIL Tandem on Airex x30" BIL SLS x30" BIL Tandem walking at counter x2 laps   [facility]                                                [facility]: 04/14/2022 Therapeutic Exercise: Tandem stance 2x30" each R ankle inv/ev RTB 2x10    PATIENT EDUCATION:  Education details: eval findings, FOTO, HEP, POC Person educated: Patient Education method: Explanation, Demonstration, and Handouts Education  comprehension: verbalized understanding and returned demonstration   HOME EXERCISE PROGRAM: Access Code: [email or web address] URL: [email or web address] Date: 04/14/2022 Prepared by: Octavio Manns   Exercises - Tandem Stance  - 1 x daily - 7 x weekly - 2-3 reps - 30 sec hold - Seated Ankle Inversion with Resistance and Legs Crossed  - 1 x daily - 7 x weekly - 3 sets - 10 reps - red band hold - Long Sitting Ankle Eversion with Resistance  - 1 x daily - 7 x weekly - 3 sets - 10 reps - red band hold Added 04/28/22 - Heel Raises with Counter Support  - 1 x daily - 7 x weekly - 2 sets - 10 reps   ASSESSMENT:   CLINICAL IMPRESSION: Patient presents to PT reporting no current pain and up to 3/10 at work today. Updated patient's HEP this session and reviewed inversion/eversion as she was performing incorrectly at home. Patient was able to tolerate all prescribed exercises with no adverse effects. Patient continues to benefit from  skilled PT services and should be progressed as able to improve functional independence.     OBJECTIVE IMPAIRMENTS: decreased activity tolerance, decreased balance, decreased strength, and pain.    ACTIVITY LIMITATIONS: standing, squatting, stairs, and transfers   PARTICIPATION LIMITATIONS: shopping, community activity, occupation, and yard work   PERSONAL FACTORS: Time since onset of injury/illness/exacerbation are also affecting patient's functional outcome.    REHAB POTENTIAL: Excellent   CLINICAL DECISION MAKING: Stable/uncomplicated   EVALUATION COMPLEXITY: Low     GOALS: Goals reviewed with patient? No   SHORT TERM GOALS: Target date: 05/05/2022   Pt will be compliant and knowledgeable with initial HEP for improved comfort and carryover Baseline: initial HEP given Goal status: INITIAL   2.  Pt will self report right ankle pain no greater than 3/10 for improved comfort and functional ability Baseline: 5/10 at worst Goal status: INITIAL    LONG TERM GOALS: Target date: 06/09/2022   Pt will improve FOTO function score to no less than 80% as proxy for functional improvement Baseline: 72% function Goal status: INITIAL    2.  Pt will self report right ankle pain no greater than 1/10 for improved comfort and functional ability Baseline: 5/10 at worst Goal status: INITIAL    3.  Pt will improve R ankle SLS to no less than 30 seconds for improved balance and functional mobility Baseline: 17 seconds Goal status: INITIAL   4.  Pt will improve R ankle inversion MMT to 5/5 with no pain during testing for improved comfort and function Baseline: 4+/5 Goal status: INITIAL   PLAN:   PT FREQUENCY: 1x/week   PT DURATION: 8 weeks   PLANNED INTERVENTIONS: Therapeutic exercises, Therapeutic activity, Neuromuscular re-education, Balance training, Gait training, Patient/Family education, Self Care, Joint mobilization, Dry Needling, Electrical stimulation, Cryotherapy, Moist heat,  Vasopneumatic device, Manual therapy, and Re-evaluation   PLAN FOR NEXT SESSION: assess HEP response, progress as able   Margarette Canada, PTA 04/28/2022, 5:41 PM

## 2022-04-29 ENCOUNTER — Other Ambulatory Visit: Payer: Self-pay

## 2022-04-29 ENCOUNTER — Other Ambulatory Visit (HOSPITAL_COMMUNITY): Payer: Self-pay

## 2022-05-04 NOTE — Therapy (Signed)
OUTPATIENT PHYSICAL THERAPY TREATMENT NOTE   Patient Name: Erin [name] MRN: [id number] DOB:Jun 09, 1965, 57 y.o., female Today's Date: 05/06/2022  PCP: Elby Showers, MD  REFERRING PROVIDER: Dene Gentry, MD  END OF SESSION:   PT End of Session - 05/05/22 1658     Visit Number 3    Number of Visits 9    Date for PT Re-Evaluation 06/10/22    Authorization Type [employer]    PT Start Time 1700    PT Stop Time [id number]    PT Time Calculation (min) 45 min    Activity Tolerance Patient tolerated treatment well    Behavior During Therapy WFL for tasks assessed/performed              Past Medical History:  Diagnosis Date   Allergy    Anemia    no meds   Asthma    as child   Complication of anesthesia    Fibrocystic breast disease    Finger mass, right    small finger   Hemorrhoids    Hypertension    Hypothyroidism    PONV (postoperative nausea and vomiting)    Thyroid disease    Past Surgical History:  Procedure Laterality Date   BREAST CYST EXCISION Left    BREAST EXCISIONAL BIOPSY Bilateral    BREAST SURGERY     fibrocystic breast/Bil   MASS EXCISION Right 04/25/2019   Procedure: EXCISION MASS DISTAL PHALANX RIGHT SMALL FINGER;  Surgeon: Daryll Brod, MD;  Location: [place];  Service: Orthopedics;  Laterality: Right;  IV REGIONAL FOREARM BLOCK   TUBAL LIGATION     WISDOM TOOTH EXTRACTION     Patient Active Problem List   Diagnosis Date Noted   Finger mass, right 03/01/2019   Chronic benign neutropenia ([place]) 08/24/2018   Left knee pain 05/15/2014   Left shoulder pain 09/19/2013   Right knee pain 09/28/2012   Migraine headache 01/11/2011   Hemorrhoids 01/11/2011   Fibrocystic breast disease 01/11/2011   Goiter 01/11/2011   Allergic rhinitis 04/20/2009   PELVIC SPRAIN AND STRAINS 04/20/2009    REFERRING DIAG: M25.571 (ICD-10-CM) - Pain in joint involving right ankle and foot    THERAPY DIAG:  Pain in right ankle and joints of  right foot  Unsteadiness on feet  Muscle weakness (generalized)  Rationale for Evaluation and Treatment Rehabilitation  PERTINENT HISTORY: HTN  PRECAUTIONS: None  SUBJECTIVE:                                                                                                                                                                                      SUBJECTIVE STATEMENT:  Pt presents to PT with no current reports of pain or discomfort. Has been compliant with HEP with no adverse effect.    PAIN:  Are you having pain?  No: NPRS scale:  0-3/10 Worst: 5/10 at worst Pain location: R medial ankle Pain description: burning Aggravating factors: prolonged standing, exercises Relieving factors: orthotics, ice   OBJECTIVE: (objective measures completed at initial evaluation unless otherwise dated)   DIAGNOSTIC FINDINGS:             See recent imaging for MRI   PATIENT SURVEYS:  FOTO: 72% function; 80% predicted    COGNITION: Overall cognitive status: Within functional limits for tasks assessed                         SENSATION: WFL   EDEMA:  Circumferential: R: 34.5cm;   L: 34cm   POSTURE: rounded shoulders and forward head   PALPATION: TTP to R posterior tibialis muscle belly, insertion   LOWER EXTREMITY ROM:   Active ROM Right eval Left eval  Hip flexion      Hip extension      Hip abduction      Hip adduction      Hip internal rotation      Hip external rotation      Knee flexion      Knee extension      Ankle dorsiflexion 15 15  Ankle plantarflexion [facility] WFL  Ankle inversion      Ankle eversion       (Blank rows = not tested)   LOWER EXTREMITY MMT:   MMT Right eval Left eval  Hip flexion      Hip extension      Hip abduction      Hip adduction      Hip internal rotation      Hip external rotation      Knee flexion      Knee extension      Ankle dorsiflexion 5/5 5/5  Ankle plantarflexion 5/5 5/5  Ankle inversion 4+/5 5/5  Ankle  eversion 5/5 5/5   (Blank rows = not tested)   LOWER EXTREMITY SPECIAL TESTS:  DNT   FUNCTIONAL TESTS:  SLS: R - 17 seconds; L - 30 seconds   GAIT: Distance walked: 36f Assistive device utilized: None Level of assistance: Complete Independence Comments: no deviatoins   TREATMENT: [facility] Adult PT Treatment:                                                DATE: 05/05/22 Therapeutic Exercise: Bike level 3 x 3 mins Heel raise with tennis ball btw heels 2x15 Step up 8in 2x10 fwd each Ankle 4-way 2x15 GTB Neuromuscular re-ed: Tandem on Airex 2x30" BIL Tandem walk on foam x 3 laps at counter KB cross kickstand on foam 2x10 10# Manual Therapy: STM and trigger point release to R posterior tib  [facility] Adult PT Treatment:                                                DATE: 04/28/22 Therapeutic Exercise: Bike level 3 x 5 mins Heel raise with tennis ball btw heels 2x10 Toe raise back against wall  2x10 4 way ankle RTB x10 each Rt Slant board gastroc stretch x1' Neuromuscular re-ed: Tandem stance x30" BIL Tandem on Airex x30" BIL SLS x30" BIL Tandem walking at counter x2 laps   [facility] Adult PT Treatment:                                                DATE: 04/14/2022 Therapeutic Exercise: Tandem stance 2x30" each R ankle inv/ev RTB 2x10    PATIENT EDUCATION:  Education details: eval findings, FOTO, HEP, POC Person educated: Patient Education method: Explanation, Demonstration, and Handouts Education comprehension: verbalized understanding and returned demonstration   HOME EXERCISE PROGRAM: Access Code: [email or web address] URL: [email or web address] Date: 04/14/2022 Prepared by: Octavio Manns   Exercises - Tandem Stance  - 1 x daily - 7 x weekly - 2-3 reps - 30 sec hold - Seated Ankle Inversion with Resistance and Legs Crossed  - 1 x daily - 7 x weekly - 3 sets - 10 reps - red band hold - Long Sitting Ankle Eversion with Resistance  - 1 x daily - 7 x weekly - 3 sets - 10 reps  - red band hold Added 04/28/22 - Heel Raises with Counter Support  - 1 x daily - 7 x weekly - 2 sets - 10 reps   ASSESSMENT:   CLINICAL IMPRESSION: Pt was able to complete prescribed exercises with no adverse effect. Therapy once again focused on improving ankle strength and balance. Responded well to manual therapy interventions. Continues to benefit from skilled PT, will progress as tolerated.    OBJECTIVE IMPAIRMENTS: decreased activity tolerance, decreased balance, decreased strength, and pain.    ACTIVITY LIMITATIONS: standing, squatting, stairs, and transfers   PARTICIPATION LIMITATIONS: shopping, community activity, occupation, and yard work   PERSONAL FACTORS: Time since onset of injury/illness/exacerbation are also affecting patient's functional outcome.      GOALS: Goals reviewed with patient? No   SHORT TERM GOALS: Target date: 05/05/2022   Pt will be compliant and knowledgeable with initial HEP for improved comfort and carryover Baseline: initial HEP given Goal status: INITIAL   2.  Pt will self report right ankle pain no greater than 3/10 for improved comfort and functional ability Baseline: 5/10 at worst Goal status: INITIAL    LONG TERM GOALS: Target date: 06/09/2022   Pt will improve FOTO function score to no less than 80% as proxy for functional improvement Baseline: 72% function Goal status: INITIAL    2.  Pt will self report right ankle pain no greater than 1/10 for improved comfort and functional ability Baseline: 5/10 at worst Goal status: INITIAL    3.  Pt will improve R ankle SLS to no less than 30 seconds for improved balance and functional mobility Baseline: 17 seconds Goal status: INITIAL   4.  Pt will improve R ankle inversion MMT to 5/5 with no pain during testing for improved comfort and function Baseline: 4+/5 Goal status: INITIAL   PLAN:   PT FREQUENCY: 1x/week   PT DURATION: 8 weeks   PLANNED INTERVENTIONS: Therapeutic exercises,  Therapeutic activity, Neuromuscular re-education, Balance training, Gait training, Patient/Family education, Self Care, Joint mobilization, Dry Needling, Electrical stimulation, Cryotherapy, Moist heat, Vasopneumatic device, Manual therapy, and Re-evaluation   PLAN FOR NEXT SESSION: assess HEP response, progress as able   Ward Chatters, PT 05/06/2022, 8:56 AM

## 2022-05-05 ENCOUNTER — Ambulatory Visit: Payer: 59

## 2022-05-05 DIAGNOSIS — M6281 Muscle weakness (generalized): Secondary | ICD-10-CM

## 2022-05-05 DIAGNOSIS — M25571 Pain in right ankle and joints of right foot: Secondary | ICD-10-CM

## 2022-05-05 DIAGNOSIS — R2681 Unsteadiness on feet: Secondary | ICD-10-CM | POA: Diagnosis not present

## 2022-05-09 ENCOUNTER — Other Ambulatory Visit (HOSPITAL_COMMUNITY): Payer: Self-pay

## 2022-05-09 ENCOUNTER — Other Ambulatory Visit: Payer: Self-pay | Admitting: Internal Medicine

## 2022-05-10 MED ORDER — ROSUVASTATIN CALCIUM 5 MG PO TABS
5.0000 mg | ORAL_TABLET | ORAL | 3 refills | Status: DC
  Filled 2022-05-10 – 2022-05-18 (×3): qty 36, 84d supply, fill #0
  Filled 2022-07-25 – 2022-08-07 (×2): qty 36, 84d supply, fill #1
  Filled 2022-10-29: qty 36, 84d supply, fill #2
  Filled 2023-01-18: qty 36, 84d supply, fill #3

## 2022-05-11 ENCOUNTER — Other Ambulatory Visit: Payer: Self-pay

## 2022-05-11 ENCOUNTER — Other Ambulatory Visit (HOSPITAL_COMMUNITY): Payer: Self-pay

## 2022-05-12 ENCOUNTER — Other Ambulatory Visit (HOSPITAL_COMMUNITY): Payer: Self-pay

## 2022-05-12 ENCOUNTER — Encounter (HOSPITAL_COMMUNITY): Payer: Self-pay

## 2022-05-12 ENCOUNTER — Ambulatory Visit: Payer: 59 | Attending: Internal Medicine

## 2022-05-12 DIAGNOSIS — M25571 Pain in right ankle and joints of right foot: Secondary | ICD-10-CM | POA: Insufficient documentation

## 2022-05-12 DIAGNOSIS — R2681 Unsteadiness on feet: Secondary | ICD-10-CM | POA: Insufficient documentation

## 2022-05-12 DIAGNOSIS — M6281 Muscle weakness (generalized): Secondary | ICD-10-CM | POA: Diagnosis not present

## 2022-05-12 NOTE — Therapy (Signed)
OUTPATIENT PHYSICAL THERAPY TREATMENT NOTE   Patient Name: Erin [name] MRN: [id number] DOB:1965/05/14, 57 y.o., female Today's Date: 05/13/2022  PCP: Elby Showers, MD  REFERRING PROVIDER: Dene Gentry, MD  END OF SESSION:   PT End of Session - 05/12/22 1650     Visit Number 4    Number of Visits 9    Date for PT Re-Evaluation 06/10/22    Authorization Type [employer]    PT Start Time 1700    PT Stop Time 1740    PT Time Calculation (min) 40 min    Activity Tolerance Patient tolerated treatment well    Behavior During Therapy WFL for tasks assessed/performed               Past Medical History:  Diagnosis Date   Allergy    Anemia    no meds   Asthma    as child   Complication of anesthesia    Fibrocystic breast disease    Finger mass, right    small finger   Hemorrhoids    Hypertension    Hypothyroidism    PONV (postoperative nausea and vomiting)    Thyroid disease    Past Surgical History:  Procedure Laterality Date   BREAST CYST EXCISION Left    BREAST EXCISIONAL BIOPSY Bilateral    BREAST SURGERY     fibrocystic breast/Bil   MASS EXCISION Right 04/25/2019   Procedure: EXCISION MASS DISTAL PHALANX RIGHT SMALL FINGER;  Surgeon: Daryll Brod, MD;  Location: [place];  Service: Orthopedics;  Laterality: Right;  IV REGIONAL FOREARM BLOCK   TUBAL LIGATION     WISDOM TOOTH EXTRACTION     Patient Active Problem List   Diagnosis Date Noted   Finger mass, right 03/01/2019   Chronic benign neutropenia ([place]) 08/24/2018   Left knee pain 05/15/2014   Left shoulder pain 09/19/2013   Right knee pain 09/28/2012   Migraine headache 01/11/2011   Hemorrhoids 01/11/2011   Fibrocystic breast disease 01/11/2011   Goiter 01/11/2011   Allergic rhinitis 04/20/2009   PELVIC SPRAIN AND STRAINS 04/20/2009    REFERRING DIAG: M25.571 (ICD-10-CM) - Pain in joint involving right ankle and foot    THERAPY DIAG:  Pain in right ankle and joints of  right foot  Unsteadiness on feet  Muscle weakness (generalized)  Rationale for Evaluation and Treatment Rehabilitation  PERTINENT HISTORY: HTN  PRECAUTIONS: None  SUBJECTIVE:                                                                                                                                                                                      SUBJECTIVE  STATEMENT:  Pt presents to PT with reports of continued decrease in pain. Has continued HEP compliance with no adverse effect.    PAIN:  Are you having pain?  No: NPRS scale:  0-3/10 Worst: 5/10 at worst Pain location: R medial ankle Pain description: burning Aggravating factors: prolonged standing, exercises Relieving factors: orthotics, ice   OBJECTIVE: (objective measures completed at initial evaluation unless otherwise dated)   DIAGNOSTIC FINDINGS:             See recent imaging for MRI   PATIENT SURVEYS:  FOTO: 72% function; 80% predicted    COGNITION: Overall cognitive status: Within functional limits for tasks assessed                         SENSATION: WFL   EDEMA:  Circumferential: R: 34.5cm;   L: 34cm   POSTURE: rounded shoulders and forward head   PALPATION: TTP to R posterior tibialis muscle belly, insertion   LOWER EXTREMITY ROM:   Active ROM Right eval Left eval  Hip flexion      Hip extension      Hip abduction      Hip adduction      Hip internal rotation      Hip external rotation      Knee flexion      Knee extension      Ankle dorsiflexion 15 15  Ankle plantarflexion [facility] WFL  Ankle inversion      Ankle eversion       (Blank rows = not tested)   LOWER EXTREMITY MMT:   MMT Right eval Left eval  Hip flexion      Hip extension      Hip abduction      Hip adduction      Hip internal rotation      Hip external rotation      Knee flexion      Knee extension      Ankle dorsiflexion 5/5 5/5  Ankle plantarflexion 5/5 5/5  Ankle inversion 4+/5 5/5  Ankle eversion  5/5 5/5   (Blank rows = not tested)   LOWER EXTREMITY SPECIAL TESTS:  DNT   FUNCTIONAL TESTS:  SLS: R - 17 seconds; L - 30 seconds   GAIT: Distance walked: 61f Assistive device utilized: None Level of assistance: Complete Independence Comments: no deviatoins   TREATMENT: [facility] Adult PT Treatment:                                                DATE: 05/12/22 Therapeutic Exercise: Bike level 3 x 4 min while taking subjective Heel raise with tennis ball btw heels x 20 Standing wobble board 2x10 DF/PF Step up with march 8in 2x10 fwd R stance Ankle DF/Inv/Ev 2x15 GTB Ankle PF 2x15 black TB Neuromuscular re-ed: Tandem on Airex 2x30" BIL SLS on foam 2x30" R Tandem walk on foam x 4 laps at counter KB cross kickstand on foam 2x10 10#  [facility] Adult PT Treatment:                                                DATE: 05/05/22 Therapeutic Exercise: Bike level 3 x 3 mins Heel raise  with tennis ball btw heels 2x15 Step up 8in 2x10 fwd each Ankle 4-way 2x15 GTB Neuromuscular re-ed: Tandem on Airex 2x30" BIL Tandem walk on foam x 3 laps at counter KB cross kickstand on foam 2x10 10# Manual Therapy: STM and trigger point release to R posterior tib  [facility] Adult PT Treatment:                                                DATE: 04/28/22 Therapeutic Exercise: Bike level 3 x 5 mins Heel raise with tennis ball btw heels 2x10 Toe raise back against wall 2x10 4 way ankle RTB x10 each Rt Slant board gastroc stretch x1' Neuromuscular re-ed: Tandem stance x30" BIL Tandem on Airex x30" BIL SLS x30" BIL Tandem walking at counter x2 laps   [facility] Adult PT Treatment:                                                DATE: 04/14/2022 Therapeutic Exercise: Tandem stance 2x30" each R ankle inv/ev RTB 2x10    PATIENT EDUCATION:  Education details: eval findings, FOTO, HEP, POC Person educated: Patient Education method: Explanation, Demonstration, and Handouts Education comprehension: verbalized  understanding and returned demonstration   HOME EXERCISE PROGRAM: Access Code: [email or web address] URL: [email or web address] Date: 04/14/2022 Prepared by: Octavio Manns   Exercises - Tandem Stance  - 1 x daily - 7 x weekly - 2-3 reps - 30 sec hold - Seated Ankle Inversion with Resistance and Legs Crossed  - 1 x daily - 7 x weekly - 3 sets - 10 reps - red band hold - Long Sitting Ankle Eversion with Resistance  - 1 x daily - 7 x weekly - 3 sets - 10 reps - red band hold Added 04/28/22 - Heel Raises with Counter Support  - 1 x daily - 7 x weekly - 2 sets - 10 reps   ASSESSMENT:   CLINICAL IMPRESSION: Pt was able to complete prescribed exercises with no adverse effect. Therapy once again focused on improving ankle strength and balance. Shows improving balance and ankle stability, is challenged in SLS position. Continues to benefit from skilled PT, will progress as tolerated.     OBJECTIVE IMPAIRMENTS: decreased activity tolerance, decreased balance, decreased strength, and pain.    ACTIVITY LIMITATIONS: standing, squatting, stairs, and transfers   PARTICIPATION LIMITATIONS: shopping, community activity, occupation, and yard work   PERSONAL FACTORS: Time since onset of injury/illness/exacerbation are also affecting patient's functional outcome.      GOALS: Goals reviewed with patient? No   SHORT TERM GOALS: Target date: 05/05/2022   Pt will be compliant and knowledgeable with initial HEP for improved comfort and carryover Baseline: initial HEP given Goal status: INITIAL   2.  Pt will self report right ankle pain no greater than 3/10 for improved comfort and functional ability Baseline: 5/10 at worst Goal status: INITIAL    LONG TERM GOALS: Target date: 06/09/2022   Pt will improve FOTO function score to no less than 80% as proxy for functional improvement Baseline: 72% function Goal status: INITIAL    2.  Pt will self report right ankle pain no greater than 1/10 for  improved comfort and functional ability Baseline: 5/10 at worst Goal  status: INITIAL    3.  Pt will improve R ankle SLS to no less than 30 seconds for improved balance and functional mobility Baseline: 17 seconds Goal status: INITIAL   4.  Pt will improve R ankle inversion MMT to 5/5 with no pain during testing for improved comfort and function Baseline: 4+/5 Goal status: INITIAL   PLAN:   PT FREQUENCY: 1x/week   PT DURATION: 8 weeks   PLANNED INTERVENTIONS: Therapeutic exercises, Therapeutic activity, Neuromuscular re-education, Balance training, Gait training, Patient/Family education, Self Care, Joint mobilization, Dry Needling, Electrical stimulation, Cryotherapy, Moist heat, Vasopneumatic device, Manual therapy, and Re-evaluation   PLAN FOR NEXT SESSION: assess HEP response, progress as able   Ward Chatters, PT 05/13/2022, 8:26 AM

## 2022-05-15 ENCOUNTER — Other Ambulatory Visit: Payer: Self-pay

## 2022-05-16 ENCOUNTER — Other Ambulatory Visit: Payer: Self-pay

## 2022-05-17 ENCOUNTER — Other Ambulatory Visit: Payer: Self-pay

## 2022-05-17 MED ORDER — LEVOTHYROXINE SODIUM 25 MCG PO TABS
25.0000 ug | ORAL_TABLET | Freq: Every morning | ORAL | 4 refills | Status: DC
  Filled 2022-05-17 – 2022-08-07 (×2): qty 90, 90d supply, fill #0
  Filled 2022-11-01: qty 90, 90d supply, fill #1
  Filled 2023-01-18: qty 90, 90d supply, fill #2

## 2022-05-18 ENCOUNTER — Other Ambulatory Visit (HOSPITAL_COMMUNITY): Payer: Self-pay

## 2022-05-18 ENCOUNTER — Other Ambulatory Visit: Payer: Self-pay

## 2022-05-18 ENCOUNTER — Other Ambulatory Visit: Payer: Self-pay | Admitting: Internal Medicine

## 2022-05-18 MED ORDER — MUPIROCIN 2 % EX OINT
1.0000 | TOPICAL_OINTMENT | Freq: Two times a day (BID) | CUTANEOUS | 96 refills | Status: AC
  Filled 2022-05-18: qty 22, 7d supply, fill #0
  Filled 2022-10-15: qty 22, 7d supply, fill #1
  Filled 2022-10-26: qty 22, 7d supply, fill #2
  Filled 2022-11-13: qty 22, 7d supply, fill #3
  Filled 2022-11-28: qty 22, 7d supply, fill #4
  Filled 2023-02-05: qty 22, 7d supply, fill #5
  Filled 2023-03-08: qty 22, 7d supply, fill #6
  Filled 2023-04-09: qty 22, 7d supply, fill #7
  Filled 2023-05-13: qty 22, 7d supply, fill #8

## 2022-05-19 ENCOUNTER — Ambulatory Visit: Payer: 59

## 2022-05-19 DIAGNOSIS — M6281 Muscle weakness (generalized): Secondary | ICD-10-CM | POA: Diagnosis not present

## 2022-05-19 DIAGNOSIS — R2681 Unsteadiness on feet: Secondary | ICD-10-CM

## 2022-05-19 DIAGNOSIS — M25571 Pain in right ankle and joints of right foot: Secondary | ICD-10-CM

## 2022-05-19 NOTE — Therapy (Signed)
OUTPATIENT PHYSICAL THERAPY TREATMENT NOTE   Patient Name: Erin [name] MRN: [id number] DOB:December 22, 1965, 57 y.o., female Today's Date: 05/20/2022  PCP: Elby Showers, MD  REFERRING PROVIDER: Dene Gentry, MD  END OF SESSION:   PT End of Session - 05/19/22 1656     Visit Number 5    Number of Visits 9    Date for PT Re-Evaluation 06/10/22    Authorization Type [employer]    PT Start Time 1700    PT Stop Time 1740    PT Time Calculation (min) 40 min    Activity Tolerance Patient tolerated treatment well    Behavior During Therapy WFL for tasks assessed/performed                Past Medical History:  Diagnosis Date   Allergy    Anemia    no meds   Asthma    as child   Complication of anesthesia    Fibrocystic breast disease    Finger mass, right    small finger   Hemorrhoids    Hypertension    Hypothyroidism    PONV (postoperative nausea and vomiting)    Thyroid disease    Past Surgical History:  Procedure Laterality Date   BREAST CYST EXCISION Left    BREAST EXCISIONAL BIOPSY Bilateral    BREAST SURGERY     fibrocystic breast/Bil   MASS EXCISION Right 04/25/2019   Procedure: EXCISION MASS DISTAL PHALANX RIGHT SMALL FINGER;  Surgeon: Daryll Brod, MD;  Location: [place];  Service: Orthopedics;  Laterality: Right;  IV REGIONAL FOREARM BLOCK   TUBAL LIGATION     WISDOM TOOTH EXTRACTION     Patient Active Problem List   Diagnosis Date Noted   Finger mass, right 03/01/2019   Chronic benign neutropenia ([place]) 08/24/2018   Left knee pain 05/15/2014   Left shoulder pain 09/19/2013   Right knee pain 09/28/2012   Migraine headache 01/11/2011   Hemorrhoids 01/11/2011   Fibrocystic breast disease 01/11/2011   Goiter 01/11/2011   Allergic rhinitis 04/20/2009   PELVIC SPRAIN AND STRAINS 04/20/2009    REFERRING DIAG: M25.571 (ICD-10-CM) - Pain in joint involving right ankle and foot    THERAPY DIAG:  Pain in right ankle and joints of  right foot  Unsteadiness on feet  Muscle weakness (generalized)  Rationale for Evaluation and Treatment Rehabilitation  PERTINENT HISTORY: HTN  PRECAUTIONS: None  SUBJECTIVE:  SUBJECTIVE STATEMENT:  Pt presents to PT with slight R ankle pain. Has continued HEP compliance. Has f/u visit with MD tomorrow.    PAIN:  Are you having pain?  Yes: NPRS scale:  1-2/10 Worst: 5/10 at worst Pain location: R medial ankle Pain description: burning Aggravating factors: prolonged standing, exercises Relieving factors: orthotics, ice   OBJECTIVE: (objective measures completed at initial evaluation unless otherwise dated)   DIAGNOSTIC FINDINGS:             See recent imaging for MRI   PATIENT SURVEYS:  FOTO: 77% function; 80% predicted - 05/19/2022   COGNITION: Overall cognitive status: Within functional limits for tasks assessed                         SENSATION: WFL   EDEMA:  Circumferential: R: 34.5cm;   L: 34cm   POSTURE: rounded shoulders and forward head   PALPATION: TTP to R posterior tibialis muscle belly, insertion   LOWER EXTREMITY ROM:   Active ROM Right eval Left eval  Hip flexion      Hip extension      Hip abduction      Hip adduction      Hip internal rotation      Hip external rotation      Knee flexion      Knee extension      Ankle dorsiflexion 15 15  Ankle plantarflexion [facility] WFL  Ankle inversion      Ankle eversion       (Blank rows = not tested)   LOWER EXTREMITY MMT:   MMT Right eval Left eval  Hip flexion      Hip extension      Hip abduction      Hip adduction      Hip internal rotation      Hip external rotation      Knee flexion      Knee extension      Ankle dorsiflexion 5/5 5/5  Ankle plantarflexion 5/5 5/5  Ankle inversion 4+/5 5/5  Ankle  eversion 5/5 5/5   (Blank rows = not tested)   LOWER EXTREMITY SPECIAL TESTS:  DNT   FUNCTIONAL TESTS:  SLS: R - 25 seconds - 05/19/2022   GAIT: Distance walked: 37f Assistive device utilized: None Level of assistance: Complete Independence Comments: no deviatoins   TREATMENT: [facility] Adult PT Treatment:                                                DATE: 05/19/22 Therapeutic Exercise: Bike level 3 x 4 min while taking subjective Heel raise with tennis ball btw heels 2x20 Standing wobble board 2x10 DF/PF Neuromuscular re-ed: Tandem on Airex 2x30" BIL Tandem walk on foam x 4 laps at counter KB cross kickstand on foam 2x10 10# Manual Therapy: STM and trigger point release to R posterior tib  [facility] Adult PT Treatment:                                                DATE: 05/12/22 Therapeutic Exercise: Bike level 3 x 4 min while taking subjective Heel raise with tennis ball btw heels x 20 Standing wobble board  2x10 DF/PF Step up with march 8in 2x10 fwd R stance Ankle DF/Inv/Ev 2x15 GTB Ankle PF 2x15 black TB Neuromuscular re-ed: Tandem on Airex 2x30" BIL SLS on foam 2x30" R Tandem walk on foam x 4 laps at counter KB cross kickstand on foam 2x10 10#  [facility] Adult PT Treatment:                                                DATE: 05/05/22 Therapeutic Exercise: Bike level 3 x 3 mins Heel raise with tennis ball btw heels 2x15 Step up 8in 2x10 fwd each Ankle 4-way 2x15 GTB Neuromuscular re-ed: Tandem on Airex 2x30" BIL Tandem walk on foam x 3 laps at counter KB cross kickstand on foam 2x10 10# Manual Therapy: STM and trigger point release to R posterior tib  [facility] Adult PT Treatment:                                                DATE: 04/28/22 Therapeutic Exercise: Bike level 3 x 5 mins Heel raise with tennis ball btw heels 2x10 Toe raise back against wall 2x10 4 way ankle RTB x10 each Rt Slant board gastroc stretch x1' Neuromuscular re-ed: Tandem stance x30" BIL Tandem on  Airex x30" BIL SLS x30" BIL Tandem walking at counter x2 laps   [facility] Adult PT Treatment:                                                DATE: 04/14/2022 Therapeutic Exercise: Tandem stance 2x30" each R ankle inv/ev RTB 2x10    PATIENT EDUCATION:  Education details: eval findings, FOTO, HEP, POC Person educated: Patient Education method: Explanation, Demonstration, and Handouts Education comprehension: verbalized understanding and returned demonstration   HOME EXERCISE PROGRAM: Access Code: [email or web address] URL: [email or web address] Date: 04/14/2022 Prepared by: Octavio Manns   Exercises - Tandem Stance  - 1 x daily - 7 x weekly - 2-3 reps - 30 sec hold - Seated Ankle Inversion with Resistance and Legs Crossed  - 1 x daily - 7 x weekly - 3 sets - 10 reps - red band hold - Long Sitting Ankle Eversion with Resistance  - 1 x daily - 7 x weekly - 3 sets - 10 reps - red band hold Added 04/28/22 - Heel Raises with Counter Support  - 1 x daily - 7 x weekly - 2 sets - 10 reps   ASSESSMENT:   CLINICAL IMPRESSION: Pt was able to complete prescribed exercises with no adverse effect. Therapy once again focused on improving ankle strength and balance. Shows improving balance and ankle stability, is challenged in SLS position and has continued R ankle weakness. She has improved SLS and FOTO score, has f/u with MD tomorrow. If she does well over next two weeks, may discharge at that time.   OBJECTIVE IMPAIRMENTS: decreased activity tolerance, decreased balance, decreased strength, and pain.    ACTIVITY LIMITATIONS: standing, squatting, stairs, and transfers   PARTICIPATION LIMITATIONS: shopping, community activity, occupation, and yard work   PERSONAL FACTORS: Time since onset of injury/illness/exacerbation are also affecting patient's functional outcome.  GOALS: Goals reviewed with patient? No   SHORT TERM GOALS: Target date: 05/05/2022   Pt will be compliant and knowledgeable  with initial HEP for improved comfort and carryover Baseline: initial HEP given Goal status: MET   2.  Pt will self report right ankle pain no greater than 3/10 for improved comfort and functional ability Baseline: 5/10 at worst Goal status: MET    LONG TERM GOALS: Target date: 06/09/2022   Pt will improve FOTO function score to no less than 80% as proxy for functional improvement Baseline: 72% function 05/19/2022: 77% function Goal status: ONGOING   2.  Pt will self report right ankle pain no greater than 1/10 for improved comfort and functional ability Baseline: 5/10 at worst Goal status: ONGOING   3.  Pt will improve R ankle SLS to no less than 30 seconds for improved balance and functional mobility Baseline: 17 seconds 05/19/2022: 25 seconds Goal status: ONGOING   4.  Pt will improve R ankle inversion MMT to 5/5 with no pain during testing for improved comfort and function Baseline: 4+/5 Goal status: ONGOING   PLAN:   PT FREQUENCY: 1x/week   PT DURATION: 8 weeks   PLANNED INTERVENTIONS: Therapeutic exercises, Therapeutic activity, Neuromuscular re-education, Balance training, Gait training, Patient/Family education, Self Care, Joint mobilization, Dry Needling, Electrical stimulation, Cryotherapy, Moist heat, Vasopneumatic device, Manual therapy, and Re-evaluation   PLAN FOR NEXT SESSION: assess HEP response, progress as able   Ward Chatters, PT 05/20/2022, 9:24 AM

## 2022-05-20 ENCOUNTER — Ambulatory Visit (INDEPENDENT_AMBULATORY_CARE_PROVIDER_SITE_OTHER): Payer: 59 | Admitting: Family Medicine

## 2022-05-20 ENCOUNTER — Encounter: Payer: Self-pay | Admitting: Family Medicine

## 2022-05-20 VITALS — BP 126/82 | Ht 68.5 in | Wt 194.0 lb

## 2022-05-20 DIAGNOSIS — M25571 Pain in right ankle and joints of right foot: Secondary | ICD-10-CM

## 2022-05-20 NOTE — Progress Notes (Signed)
PCP: Elby Showers, MD  Subjective:   HPI: Patient is a 57 y.o. female here for right foot pain.  11/6: Patient reports she did a bootcamp Saturday morning then walked a lot that day at [employer] home[employer] (over 25,000 steps). She developed swelling, pain medial and plantar right foot. She has been icing, elevating, using a compression wrap. Actually feels a lot better today. No acute injury or trauma.  12/11: Patient reports her pain has worsened since last visit. Feels primarily medial right ankle into foot. Some swelling but no bruising. No new injury. She cannot take NSAIDs though has taken prednisone in the past without side effects.  1/10: Unfortunately [place] continues to struggle with medial right foot/ankle pain. She continues to wear the sports insoles with scaphoid pads. Prednisone helped a little bit. Cannot take nsaids. Pain focal medial foot just below the ankle medially. Some swelling locally. No new injury. Worse with prolonged standing like at work.  3/13: Patient reports she's doing better. Doing physical therapy and home exercises. Has another visit of therapy in next 2 weeks then likely will be discharged. Needed prednisone dose pack a month ago and felt significantly better after this. Using sports insoles with scaphoid pads.  Past Medical History:  Diagnosis Date   Allergy    Anemia    no meds   Asthma    as child   Complication of anesthesia    Fibrocystic breast disease    Finger mass, right    small finger   Hemorrhoids    Hypertension    Hypothyroidism    PONV (postoperative nausea and vomiting)    Thyroid disease     Current Outpatient Medications on File Prior to Visit  Medication Sig Dispense Refill   albuterol (VENTOLIN HFA) 108 (90 Base) MCG/ACT inhaler Inhale 2 puffs into the lungs every 6 (six) hours as needed for wheezing or shortness of breath. 8 g PRN   amLODipine (NORVASC) 2.5 MG tablet TAKE 1 TABLET BY MOUTH DAILY. 90  tablet 2   butalbital-acetaminophen-caffeine (FIORICET) 50-325-40 MG tablet TAKE 1 TO 2 TABLETS BY MOUTH EVERY 4 TO 6 HOURS AS NEEDED FOR HEADACHE. NO MORE THAN 6 TABLETS IN 24 HOURS 60 tablet 1   diphenhydrAMINE (BENADRYL) 25 MG tablet Take 25 mg by mouth at bedtime.     EPINEPHrine 0.3 mg/0.3 mL IJ SOAJ injection INJECT 0.3 MLS (1 PEN) INTO THE MUSCLE AS NEEDED FOR ANAPHYLAXIS. 2 each PRN   estradiol (ESTRACE) 0.1 MG/GM vaginal cream Insert 1 gram vaginally every other day for 1- days / then twice weekly 42.5 g 6   fluticasone (FLONASE) 50 MCG/ACT nasal spray Place 2 sprays into both nostrils daily. 48 g 11   HYDROcodone bit-homatropine (HYCODAN) 5-1.5 MG/5ML syrup Take 5 mLs by mouth every 8 (eight) hours as needed for cough. 120 mL 0   hydrocortisone (ANUSOL-HC) 2.5 % rectal cream Apply rectally 2 times daily as directed. 30 g 5   hydrocortisone (ANUSOL-HC) 25 MG suppository Unwrap and insert 1 suppository rectally twice daily as needed for hemorrhoid flare 12 suppository 11   levothyroxine (SYNTHROID) 25 MCG tablet Take 1 tablet by mouth once daily each morning on an empty stomach 90 tablet 4   Melatonin 5 MG TABS Take 6 mg daily by mouth.     mupirocin ointment (BACTROBAN) 2 % Apply 1 Application topically 2 (two) times daily to the affected areas. 22 g 96   ondansetron (ZOFRAN) 4 MG tablet Take 1 tablet (4  mg total) by mouth every 8 (eight) hours as needed for nausea or vomiting. 20 tablet 0   predniSONE (STERAPRED UNI-PAK 21 TAB) 10 MG (21) TBPK tablet Take as directed on package 21 tablet 0   Probiotic Product (PROBIOTIC-10) CHEW Chew by mouth.     rosuvastatin (CRESTOR) 5 MG tablet Take 1 tablet (5 mg total) by mouth 3 (three) times a week. 36 tablet 3   Vitamin D, Ergocalciferol, (DRISDOL) 1.25 MG (50000 UNIT) CAPS capsule Take 1 capsule (50,000 Units total) by mouth once a week. 12 capsule 3   No current facility-administered medications on file prior to visit.    Past Surgical  History:  Procedure Laterality Date   BREAST CYST EXCISION Left    BREAST EXCISIONAL BIOPSY Bilateral    BREAST SURGERY     fibrocystic breast/Bil   MASS EXCISION Right 04/25/2019   Procedure: EXCISION MASS DISTAL PHALANX RIGHT SMALL FINGER;  Surgeon: Daryll Brod, MD;  Location: [place];  Service: Orthopedics;  Laterality: Right;  IV REGIONAL FOREARM BLOCK   TUBAL LIGATION     WISDOM TOOTH EXTRACTION      Allergies  Allergen Reactions   Aspirin     REACTION: Hives, Wheezing   Nsaids     REACTION: Hives, Wheezing   Other     Other reaction(s): Other (See Comments) REACTION: Hives, Wheezing hives   Shellfish Allergy Other (See Comments)    hives    BP 126/82   Ht 5' 8.5" (1.74 m)   Wt 194 lb (88 kg)   LMP 10/08/2015 (Approximate)   BMI 29.07 kg/m       No data to display              No data to display              Objective:  Physical Exam:  Gen: NAD, comfortable in exam room  Right foot/ankle: Minimal medial ankle swelling. FROM without pain;  normal strength. Minimal TTP distal post tib tendon at insertion distally.  Mild tenderness in body of post tib muscle. Negative ant drawer and negative talar tilt.   Thompsons test negative. NV intact distally.   Assessment & Plan:  1. Right foot pain - 2/2 post tib tendinopathy.  Continue with home exercises for 6 more weeks after completing physical therapy.  Continue with arch support - advised will likely need this long term as long as doing a job requiring a lot of standing/walking.  Consider intermittent prednisone, injection if needed.  F/u prn otherwise.

## 2022-05-25 ENCOUNTER — Other Ambulatory Visit: Payer: Self-pay

## 2022-06-16 ENCOUNTER — Ambulatory Visit: Payer: 59 | Attending: Internal Medicine

## 2022-06-16 DIAGNOSIS — M25571 Pain in right ankle and joints of right foot: Secondary | ICD-10-CM | POA: Diagnosis not present

## 2022-06-16 DIAGNOSIS — R2681 Unsteadiness on feet: Secondary | ICD-10-CM | POA: Insufficient documentation

## 2022-06-16 NOTE — Therapy (Signed)
OUTPATIENT PHYSICAL THERAPY TREATMENT NOTE   Patient Name: Erin [name] MRN: [id number] DOB:1965/07/25, 57 y.o., female Today's Date: 06/16/2022  PCP: Margaree Mackintosh, MD  REFERRING PROVIDER: Lenda Kelp, MD  END OF SESSION:   PT End of Session - 06/16/22 1659     Visit Number 6    Number of Visits 9    Date for PT Re-Evaluation 06/10/22    Authorization Type [facility]    PT Start Time 1658    PT Stop Time 1721    PT Time Calculation (min) 23 min    Activity Tolerance Patient tolerated treatment well    Behavior During Therapy WFL for tasks assessed/performed                 Past Medical History:  Diagnosis Date   Allergy    Anemia    no meds   Asthma    as child   Complication of anesthesia    Fibrocystic breast disease    Finger mass, right    small finger   Hemorrhoids    Hypertension    Hypothyroidism    PONV (postoperative nausea and vomiting)    Thyroid disease    Past Surgical History:  Procedure Laterality Date   BREAST CYST EXCISION Left    BREAST EXCISIONAL BIOPSY Bilateral    BREAST SURGERY     fibrocystic breast/Bil   MASS EXCISION Right 04/25/2019   Procedure: EXCISION MASS DISTAL PHALANX RIGHT SMALL FINGER;  Surgeon: Cindee Salt, MD;  Location: [facility];  Service: Orthopedics;  Laterality: Right;  IV REGIONAL FOREARM BLOCK   TUBAL LIGATION     WISDOM TOOTH EXTRACTION     Patient Active Problem List   Diagnosis Date Noted   Finger mass, right 03/01/2019   Chronic benign neutropenia 08/24/2018   Left knee pain 05/15/2014   Left shoulder pain 09/19/2013   Right knee pain 09/28/2012   Migraine headache 01/11/2011   Hemorrhoids 01/11/2011   Fibrocystic breast disease 01/11/2011   Goiter 01/11/2011   Allergic rhinitis 04/20/2009   PELVIC SPRAIN AND STRAINS 04/20/2009    REFERRING DIAG: M25.571 (ICD-10-CM) - Pain in joint involving right ankle and foot    THERAPY DIAG:  Pain in right ankle and joints of right  foot  Unsteadiness on feet  Rationale for Evaluation and Treatment Rehabilitation  PERTINENT HISTORY: HTN  PRECAUTIONS: None  SUBJECTIVE:                                                                                                                                                                                      SUBJECTIVE STATEMENT:  Pt  presents to PT with no current reports of pain, has been doing very well over last few weeks. She has been compliant with HEP with no adverse effect.    PAIN:  Are you having pain?  Yes: NPRS scale:  1-2/10 Worst: 5/10 at worst Pain location: R medial ankle Pain description: burning Aggravating factors: prolonged standing, exercises Relieving factors: orthotics, ice   OBJECTIVE: (objective measures completed at initial evaluation unless otherwise dated)   DIAGNOSTIC FINDINGS:             See recent imaging for MRI   PATIENT SURVEYS:  FOTO: 91% function; 80% predicted - 06/16/2022   COGNITION: Overall cognitive status: Within functional limits for tasks assessed                         SENSATION: WFL   EDEMA:  Circumferential: R: 34.5cm;   L: 34cm   POSTURE: rounded shoulders and forward head   PALPATION: TTP to R posterior tibialis muscle belly, insertion   LOWER EXTREMITY ROM:   Active ROM Right eval Left eval  Hip flexion      Hip extension      Hip abduction      Hip adduction      Hip internal rotation      Hip external rotation      Knee flexion      Knee extension      Ankle dorsiflexion 15 15  Ankle plantarflexion [facility] WFL  Ankle inversion      Ankle eversion       (Blank rows = not tested)   LOWER EXTREMITY MMT:   MMT Right eval Right 06/16/2022  Hip flexion      Hip extension      Hip abduction      Hip adduction      Hip internal rotation      Hip external rotation      Knee flexion      Knee extension      Ankle dorsiflexion 5/5   Ankle plantarflexion 5/5   Ankle inversion 4+/5 5/5  Ankle  eversion 5/5    (Blank rows = not tested)   LOWER EXTREMITY SPECIAL TESTS:  DNT   FUNCTIONAL TESTS:  SLS: R - 30 seconds - 06/16/2022   GAIT: Distance walked: 15ft Assistive device utilized: None Level of assistance: Complete Independence Comments: no deviatoins   TREATMENT: [facility] Adult PT Treatment:                                                DATE: 06/16/22 Therapeutic Exercise: Heel raise with tennis ball btw heels 2x20 SLS x 30" R R ankle DF/ev/inv x 15 GTB Therapeutic Activity: Assessment of tests/measures, goals, and outcomes for discharge  [facility] Adult PT Treatment:                                                DATE: 05/19/22 Therapeutic Exercise: Bike level 3 x 4 min while taking subjective Heel raise with tennis ball btw heels 2x20 Standing wobble board 2x10 DF/PF Neuromuscular re-ed: Tandem on Airex 2x30" BIL Tandem walk on foam x 4 laps at counter KB cross kickstand on foam  2x10 10# Manual Therapy: STM and trigger point release to R posterior tib  [facility] Adult PT Treatment:                                                DATE: 05/12/22 Therapeutic Exercise: Bike level 3 x 4 min while taking subjective Heel raise with tennis ball btw heels x 20 Standing wobble board 2x10 DF/PF Step up with march 8in 2x10 fwd R stance Ankle DF/Inv/Ev 2x15 GTB Ankle PF 2x15 black TB Neuromuscular re-ed: Tandem on Airex 2x30" BIL SLS on foam 2x30" R Tandem walk on foam x 4 laps at counter KB cross kickstand on foam 2x10 10#  [facility] Adult PT Treatment:                                                DATE: 05/05/22 Therapeutic Exercise: Bike level 3 x 3 mins Heel raise with tennis ball btw heels 2x15 Step up 8in 2x10 fwd each Ankle 4-way 2x15 GTB Neuromuscular re-ed: Tandem on Airex 2x30" BIL Tandem walk on foam x 3 laps at counter KB cross kickstand on foam 2x10 10# Manual Therapy: STM and trigger point release to R posterior tib  [facility] Adult PT Treatment:                                                 DATE: 04/28/22 Therapeutic Exercise: Bike level 3 x 5 mins Heel raise with tennis ball btw heels 2x10 Toe raise back against wall 2x10 4 way ankle RTB x10 each Rt Slant board gastroc stretch x1' Neuromuscular re-ed: Tandem stance x30" BIL Tandem on Airex x30" BIL SLS x30" BIL Tandem walking at counter x2 laps  PATIENT EDUCATION:  Education details: eval findings, FOTO, HEP, POC Person educated: Patient Education method: Explanation, Demonstration, and Handouts Education comprehension: verbalized understanding and returned demonstration   HOME EXERCISE PROGRAM: Access Code: [email or web address] URL: [email or web address] Date: 06/16/2022 Prepared by: Edwinna Areolaavid Conn Trombetta  Exercises - Single Leg Stance  - 3 x weekly - 2 reps - 30 sec hold - Ankle Dorsiflexion with Resistance  - 3 x weekly - 3 sets - 15 reps - green band hold - Seated Ankle Inversion with Resistance and Legs Crossed  - 3 x weekly - 3 sets - 15 reps - green band hold - Long Sitting Ankle Eversion with Resistance  - 3 x weekly - 3 sets - 15 reps - greenband hold - Standing Calf Raise With Small Ball at Heels  - 3 x weekly - 3 sets - 15 reps   ASSESSMENT:   CLINICAL IMPRESSION: Pt was able to complete all prescribed exercises and demonstrated knowledge of HEP with no adverse effect. Over the course of PT treatment she has progressed very well, noting decreased ankle pain and improving balance/strength. She has met all LTGs and should continue to improve with HEP compliance, no longer requiring skilled PT services. Pt in agreement with current plan, ready to discharge at this time.    OBJECTIVE IMPAIRMENTS: decreased activity tolerance, decreased balance, decreased strength, and pain.    ACTIVITY LIMITATIONS: standing,  squatting, stairs, and transfers   PARTICIPATION LIMITATIONS: shopping, community activity, occupation, and yard work   PERSONAL FACTORS: Time since onset of  injury/illness/exacerbation are also affecting patient's functional outcome.      GOALS: Goals reviewed with patient? No   SHORT TERM GOALS: Target date: 05/05/2022   Pt will be compliant and knowledgeable with initial HEP for improved comfort and carryover Baseline: initial HEP given Goal status: MET   2.  Pt will self report right ankle pain no greater than 3/10 for improved comfort and functional ability Baseline: 5/10 at worst Goal status: MET    LONG TERM GOALS: Target date: 06/09/2022   Pt will improve FOTO function score to no less than 80% as proxy for functional improvement Baseline: 72% function 05/19/2022: 77% function 06/16/2022: 91% function Goal status: MET    2.  Pt will self report right ankle pain no greater than 1/10 for improved comfort and functional ability Baseline: 5/10 at worst Goal status: MET    3.  Pt will improve R ankle SLS to no less than 30 seconds for improved balance and functional mobility Baseline: 17 seconds 05/19/2022: 25 seconds 06/16/2022: 30 seconds Goal status: MET    4.  Pt will improve R ankle inversion MMT to 5/5 with no pain during testing for improved comfort and function Baseline: 4+/5 06/16/2022: 5/5 Goal status: MET    PLAN:   PT FREQUENCY: 1x/week   PT DURATION: 8 weeks   PLANNED INTERVENTIONS: Therapeutic exercises, Therapeutic activity, Neuromuscular re-education, Balance training, Gait training, Patient/Family education, Self Care, Joint mobilization, Dry Needling, Electrical stimulation, Cryotherapy, Moist heat, Vasopneumatic device, Manual therapy, and Re-evaluation   PLAN FOR NEXT SESSION: assess HEP response, progress as able   Eloy End, PT 06/16/2022, 5:31 PM

## 2022-06-23 ENCOUNTER — Other Ambulatory Visit: Payer: Self-pay

## 2022-07-02 ENCOUNTER — Other Ambulatory Visit: Payer: Self-pay | Admitting: Internal Medicine

## 2022-07-02 ENCOUNTER — Other Ambulatory Visit (HOSPITAL_COMMUNITY): Payer: Self-pay

## 2022-07-02 ENCOUNTER — Other Ambulatory Visit: Payer: Self-pay

## 2022-07-02 MED ORDER — BUTALBITAL-APAP-CAFFEINE 50-325-40 MG PO TABS
ORAL_TABLET | ORAL | 1 refills | Status: DC
  Filled 2022-07-02: qty 60, 10d supply, fill #0

## 2022-07-07 ENCOUNTER — Telehealth: Payer: Self-pay

## 2022-07-07 NOTE — Telephone Encounter (Signed)
Patient called asking if medication could be sent to the pharmacy for motion sickness and nausea because she is going on a cruise.  I informed patient she would need to schedule an appt to discuss travel meds,  patient declined and stated she would call back if she could get time off work.

## 2022-07-10 ENCOUNTER — Other Ambulatory Visit: Payer: Self-pay | Admitting: Internal Medicine

## 2022-07-13 ENCOUNTER — Other Ambulatory Visit (HOSPITAL_COMMUNITY): Payer: Self-pay

## 2022-07-13 ENCOUNTER — Other Ambulatory Visit: Payer: Self-pay

## 2022-07-13 MED ORDER — AMLODIPINE BESYLATE 2.5 MG PO TABS
2.5000 mg | ORAL_TABLET | Freq: Every day | ORAL | 2 refills | Status: DC
  Filled 2022-07-13: qty 90, 90d supply, fill #0
  Filled 2022-10-15: qty 90, 90d supply, fill #1
  Filled 2023-01-18: qty 90, 90d supply, fill #2

## 2022-07-25 ENCOUNTER — Other Ambulatory Visit (HOSPITAL_COMMUNITY): Payer: Self-pay

## 2022-07-25 ENCOUNTER — Other Ambulatory Visit: Payer: Self-pay | Admitting: Internal Medicine

## 2022-07-25 MED ORDER — EPINEPHRINE 0.3 MG/0.3ML IJ SOAJ
0.3000 mg | INTRAMUSCULAR | 99 refills | Status: DC | PRN
  Filled 2022-07-25: qty 2, 4d supply, fill #0
  Filled 2023-07-15: qty 2, 4d supply, fill #1

## 2022-07-27 ENCOUNTER — Other Ambulatory Visit: Payer: Self-pay

## 2022-07-27 ENCOUNTER — Other Ambulatory Visit (HOSPITAL_COMMUNITY): Payer: Self-pay

## 2022-08-07 ENCOUNTER — Other Ambulatory Visit: Payer: Self-pay

## 2022-08-08 ENCOUNTER — Other Ambulatory Visit (HOSPITAL_COMMUNITY): Payer: Self-pay

## 2022-08-10 ENCOUNTER — Other Ambulatory Visit (HOSPITAL_COMMUNITY): Payer: Self-pay

## 2022-08-13 ENCOUNTER — Telehealth: Payer: Self-pay | Admitting: Internal Medicine

## 2022-08-13 NOTE — Telephone Encounter (Signed)
From the req it looks like it was coded as cholesterol and physical. I will call tomorrow, they are closed today.

## 2022-08-13 NOTE — Telephone Encounter (Signed)
Reise Hurlock [phone]  Adaja called to say that her lipid panel was not covered back on 01/28/23 because it was not put in as her annual physical. Would you check on that please? Her insurance said it was coded wrong.

## 2022-08-14 NOTE — Telephone Encounter (Signed)
Spoke with [place] with [facility] and she states that there was no balance from her November labs, no denials she could find and was coded as physical. I spoke with the patient and she is told to call and talk to [facility] that there was nothing I could do.

## 2022-08-17 ENCOUNTER — Other Ambulatory Visit: Payer: Self-pay

## 2022-09-14 ENCOUNTER — Other Ambulatory Visit: Payer: Self-pay | Admitting: *Deleted

## 2022-09-14 ENCOUNTER — Other Ambulatory Visit (HOSPITAL_COMMUNITY): Payer: Self-pay

## 2022-09-14 ENCOUNTER — Telehealth: Payer: Self-pay | Admitting: Family Medicine

## 2022-09-14 MED ORDER — PREDNISONE 10 MG (21) PO TBPK
ORAL_TABLET | ORAL | 0 refills | Status: DC
  Filled 2022-09-14: qty 21, 6d supply, fill #0

## 2022-09-14 NOTE — Telephone Encounter (Signed)
Ok to go ahead one more time.  If this recurs I would like to try an injection instead.

## 2022-09-14 NOTE — Telephone Encounter (Signed)
-----   Message from Annita Brod, [place] sent at 09/14/2022  2:46 PM EDT ----- Regarding: FW: Pt request Rx refill Ok to refill? ----- Message ----- From: Carl Best Sent: 09/14/2022   2:15 PM EDT To: Annita Brod, CMA Subject: Pt request Rx refill                           Patient says Rt foot pain has returned .  Rx #: [id number] predniSONE (STERAPRED UNI-PAK 21 TAB) 10 MG (21) TBPK tablet [[id number]]  Order Details Dose: -- Route: -- Frequency: -- Dispense Quantity: 21 tablet Refills: 0 Fills remaining: 0     Sig: Take as directed on package  -Pt uses [facility]

## 2022-09-15 ENCOUNTER — Other Ambulatory Visit (HOSPITAL_COMMUNITY): Payer: Self-pay

## 2022-09-16 ENCOUNTER — Encounter: Payer: Self-pay | Admitting: Family Medicine

## 2022-09-18 ENCOUNTER — Encounter: Payer: Self-pay | Admitting: Dietician

## 2022-09-18 ENCOUNTER — Encounter: Payer: 59 | Admitting: Dietician

## 2022-09-18 VITALS — Ht 69.0 in | Wt 196.9 lb

## 2022-09-18 DIAGNOSIS — Z008 Encounter for other general examination: Secondary | ICD-10-CM

## 2022-09-18 NOTE — Progress Notes (Signed)
Medical Nutrition Therapy  Appointment Start time:  [phone]  Appointment End time:  0915 Employee Wellness Visit 1 of 3 EID: [id number]  Primary concerns today: Dietary improvement, weight management  Referral diagnosis: Z00.8 - Encounter for nutritional assessment Preferred learning style: No preference indicated Learning readiness: Contemplating   NUTRITION ASSESSMENT   Anthropometrics  Ht: 5'9" Wt: 196.9 lbs BMI: 29.08 kg/m2 Weight Loss Goal: 10 lbs   Clinical Medical Hx: HLD, HTN Medications: Prednisone, Crestor, Amlodipine Labs: N/A Notable Signs/Symptoms: N/A Food Allergies: Shellfish   Lifestyle & Dietary Hx Pt reports goal of weight loss. Pt states that their weight has been gradually increasing over the years, wants to get weight back down a little and improve overall diet. Pt reports history of HLD and HTN, medications have gotten them into normal ranges.  Pt states that their mother lives with them and husband, mother cooks "[place] foods", tends to eat higher calorie breakfasts on the weekend (bacon, sausage, pancakes) Pt reports snacking on fruit during the day, may eat breakfast bars, yogurt, PB and banana at work,  Pt reports having tibial tendonitis (ankle), injured it at an exercise program, taking prednisone and getting significant relief from pain. Pt reports just finishing PT, continues to do exercises at home. 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   Estimated daily fluid intake: 64 oz Supplements: Probiotic, MVI Sleep: Sleeps well Stress / self-care: Moderate Current average weekly physical activity: Walks around the neighborhood   24-Hr Dietary Recall First Meal: Banana bread, water Snack: none Second Meal: Chicken salad, 6-7 crackers, watermelon, veggie sticks, sweet tea w/ lemon Snack: none Third Meal: Vegetable LoMein, Sweet and Sour pork, Broccoli,  Sesame Chicken, Water, Sweet tea w/ lemon Snack: none Beverages: Water, Sweet Tea    NUTRITION DIAGNOSIS  NB-1.1 Food and nutrition-related knowledge deficit As related to Overweight.  As evidenced by BMI of 29.08 kg/m2, history of gradual weight gain, food choices high in calories.   NUTRITION INTERVENTION  Nutrition education (E-1) on the following topics:  Educated patient on the two components of energy balance: Energy in (calories), and energy out (activity). Explain the role of negative energy balance in weight loss. Discussed options with patient to achieve a negative energy balance and how to best control energy in and energy out to accommodate their lifestyle.   Handouts Provided Include  AVS  Learning Style & Readiness for Change Teaching method utilized: Visual & Auditory  Demonstrated degree of understanding via: Teach Back  Barriers to learning/adherence to lifestyle change: None  Goals Established by Pt Begin to regularly update your weight on your FitBit App once a week on the same day. Your weight loss goal is 10 lbs over the next 2 months. Be sure to have a source of protein each time that you eat!! Journal your  food choices each day in a way that is most convenient and timely to when you eat (for accuracy). You can enter foods in your FitBit App, take a picture of your food, or use a written log. Bring this to our next appointment. Use a combination of these three strategies to effectively lower your consumption of energy dense foods 1) Consume a smaller portion when having energy dense foods 2) Consume energy dense foods less frequently 3) Find a reduced or low calorie version   MONITORING & EVALUATION Dietary intake, weekly physical activity, and weight loss  in 3 weeks.  Next Steps  Patient is to journal food choices, follow up for second wellness visit.

## 2022-09-18 NOTE — Patient Instructions (Signed)
Begin to regularly update your weight on your FitBit App once a week on the same day.  Your weight loss goal is 10 lbs over the next 2 months.  Be sure to have a source of protein each time that you eat!!  Journal your  food choices each day in a way that is most convenient and timely to when you eat (for accuracy). You can enter foods in your FitBit App, take a picture of your food, or use a written log. Bring this to our next appointment.  Use a combination of these three strategies to effectively lower your consumption of energy dense foods 1) Consume a smaller portion when having energy dense foods 2) Consume energy dense foods less frequently 3) Find a reduced or low calorie version

## 2022-09-21 ENCOUNTER — Other Ambulatory Visit: Payer: Self-pay

## 2022-10-01 ENCOUNTER — Encounter: Payer: Self-pay | Admitting: Family Medicine

## 2022-10-01 ENCOUNTER — Ambulatory Visit (INDEPENDENT_AMBULATORY_CARE_PROVIDER_SITE_OTHER): Payer: 59 | Admitting: Family Medicine

## 2022-10-01 ENCOUNTER — Other Ambulatory Visit: Payer: Self-pay

## 2022-10-01 VITALS — BP 128/78 | Ht 69.0 in | Wt 193.0 lb

## 2022-10-01 DIAGNOSIS — M79671 Pain in right foot: Secondary | ICD-10-CM

## 2022-10-01 MED ORDER — METHYLPREDNISOLONE ACETATE 40 MG/ML IJ SUSP
40.0000 mg | Freq: Once | INTRAMUSCULAR | Status: AC
Start: 2022-10-01 — End: 2022-10-01
  Administered 2022-10-01: 40 mg via INTRA_ARTICULAR

## 2022-10-01 NOTE — Progress Notes (Signed)
PCP: Margaree Mackintosh, MD  Subjective:   HPI: Patient is a 57 y.o. female here for R foot pain.  Last seen 05/20/2022 for post tib tendinopathy, encouraged to continue home exercises and complete physical therapy.  Continued arch support.  Recently had continued pain and took prednisone Dosepak earlier in July with some relief.  Continues to have some pain and swelling along the medial aspect of her right foot.  She has not injured or twisted her ankle again.  However, she does note that she feels her recent flare is due to wearing sandals without arch support during her trip recently.  However, she does endorse using arch support fairly regularly.  Past Medical History:  Diagnosis Date   Allergy    Anemia    no meds   Asthma    as child   Complication of anesthesia    Fibrocystic breast disease    Finger mass, right    small finger   Hemorrhoids    Hypertension    Hypothyroidism    PONV (postoperative nausea and vomiting)    Thyroid disease     Current Outpatient Medications on File Prior to Visit  Medication Sig Dispense Refill   albuterol (VENTOLIN HFA) 108 (90 Base) MCG/ACT inhaler Inhale 2 puffs into the lungs every 6 (six) hours as needed for wheezing or shortness of breath. 8 g PRN   amLODipine (NORVASC) 2.5 MG tablet Take 1 tablet (2.5 mg total) by mouth daily. 90 tablet 2   butalbital-acetaminophen-caffeine (FIORICET) 50-325-40 MG tablet TAKE 1 TO 2 TABLETS BY MOUTH EVERY 4 TO 6 HOURS AS NEEDED FOR HEADACHE. NO MORE THAN 6 TABLETS IN 24 HOURS 60 tablet 1   diphenhydrAMINE (BENADRYL) 25 MG tablet Take 25 mg by mouth at bedtime.     EPINEPHrine 0.3 mg/0.3 mL IJ SOAJ injection INJECT 0.3 MLS (1 PEN) INTO THE MUSCLE AS NEEDED FOR ANAPHYLAXIS. 2 each PRN   estradiol (ESTRACE) 0.1 MG/GM vaginal cream Insert 1 gram vaginally every other day for 1- days / then twice weekly 42.5 g 6   fluticasone (FLONASE) 50 MCG/ACT nasal spray Place 2 sprays into both nostrils daily. 48 g 11    HYDROcodone bit-homatropine (HYCODAN) 5-1.5 MG/5ML syrup Take 5 mLs by mouth every 8 (eight) hours as needed for cough. 120 mL 0   hydrocortisone (ANUSOL-HC) 2.5 % rectal cream Apply rectally 2 times daily as directed. 30 g 5   hydrocortisone (ANUSOL-HC) 25 MG suppository Unwrap and insert 1 suppository rectally twice daily as needed for hemorrhoid flare 12 suppository 11   levothyroxine (SYNTHROID) 25 MCG tablet Take 1 tablet (25 mcg total) by mouth every morning on an empty stomach 90 tablet 4   Melatonin 5 MG TABS Take 6 mg daily by mouth.     mupirocin ointment (BACTROBAN) 2 % Apply 1 Application topically 2 (two) times daily to the affected areas. 22 g 96   ondansetron (ZOFRAN) 4 MG tablet Take 1 tablet (4 mg total) by mouth every 8 (eight) hours as needed for nausea or vomiting. 20 tablet 0   predniSONE (STERAPRED UNI-PAK 21 TAB) 10 MG (21) TBPK tablet Take as directed on package 21 tablet 0   Probiotic Product (PROBIOTIC-10) CHEW Chew by mouth.     rosuvastatin (CRESTOR) 5 MG tablet Take 1 tablet (5 mg total) by mouth 3 (three) times a week. 36 tablet 3   Vitamin D, Ergocalciferol, (DRISDOL) 1.25 MG (50000 UNIT) CAPS capsule Take 1 capsule (50,000 Units total) by  mouth once a week. 12 capsule 3   No current facility-administered medications on file prior to visit.    Past Surgical History:  Procedure Laterality Date   BREAST CYST EXCISION Left    BREAST EXCISIONAL BIOPSY Bilateral    BREAST SURGERY     fibrocystic breast/Bil   MASS EXCISION Right 04/25/2019   Procedure: EXCISION MASS DISTAL PHALANX RIGHT SMALL FINGER;  Surgeon: Cindee Salt, MD;  Location: [facility];  Service: Orthopedics;  Laterality: Right;  IV REGIONAL FOREARM BLOCK   TUBAL LIGATION     WISDOM TOOTH EXTRACTION      Allergies  Allergen Reactions   Aspirin     REACTION: Hives, Wheezing   Nsaids     REACTION: Hives, Wheezing   Other     Other reaction(s): Other (See Comments) REACTION: Hives,  Wheezing hives   Shellfish Allergy Other (See Comments)    hives    BP 128/78   Ht 5\' 9"  (1.753 m)   Wt 193 lb (87.5 kg)   LMP 10/08/2015 (Approximate)   BMI 28.50 kg/m       No data to display              No data to display              Objective:  Physical Exam:  Gen: NAD, comfortable in exam room  R foot: Inspection: Small effusion/swelling noted to the medial aspect of the foot below the medial malleolus.  No discoloration.  Nontender along Achilles Palpation: Nontender to palpation throughout foot and ankle ROM: Full range of motion Strength: 5/5 strength Special tests: Negative calcaneal squeeze   Assessment & Plan:  1.  Right foot pain 2/2 likely tibialis posterior tendinopathy: Refractory to conservative management with home exercises and oral steroids.  Ultrasound-guided right tibialis posterior tendon steroid injection completed today.  Otherwise continue conservative management with home exercises and arch support.  Follow-up as needed  After informed written consent timeout was performed, patient was seated on exam table.  Right foot was prepped with alcohol swab medially then utilizing ultrasound guidance, patients right post tibialis tendon sheath injected with 1:1 lidocaine: depomedrol. Patient tolerated the procedure well without immediate complications.

## 2022-10-12 ENCOUNTER — Encounter: Payer: Self-pay | Admitting: Dietician

## 2022-10-12 ENCOUNTER — Encounter: Payer: 59 | Admitting: Dietician

## 2022-10-12 VITALS — Ht 69.0 in | Wt 195.7 lb

## 2022-10-12 DIAGNOSIS — Z008 Encounter for other general examination: Secondary | ICD-10-CM

## 2022-10-12 NOTE — Progress Notes (Signed)
Medical Nutrition Therapy  Appointment Start time:  1100  Appointment End time:  1135 Employee Wellness Visit 2 of 3 EID: [id number]  Primary concerns today: Dietary improvement, weight management  Referral diagnosis: Z00.8 - Encounter for nutritional assessment Preferred learning style: No preference indicated Learning readiness: Contemplating   NUTRITION ASSESSMENT   Anthropometrics  Ht: 5'9" Wt: 195.7 lbs Wt Change: -1.2 lbs BMI: 28.90 kg/m2 Weight Loss Goal/Goal Weight: -10 lbs/186 lbs   Clinical Medical Hx: HLD, HTN Medications: Prednisone, Crestor, Amlodipine Labs: N/A Notable Signs/Symptoms: N/A Food Allergies: Shellfish   Lifestyle & Dietary Hx Pt reports logging their food and weighing weekly on Tuesdays, most recent weight at home of 193.7 lbs. Pt reports logging has been helpful for accountability, pt states it has also been helping them incorporate protein at each meal. Pt reports eating half bananas now, drinking more water, less sweetened beverages. Pt reports making more health conscience decisions when eating away from home. Pt reports getting cortisone shot in their foot on 10/01/2022, foot feeling much better, pt is wanting to get back into workout classes but is currently just walking for activity. Pt states [facility] are [facility] near future. Pt reports breakfast on the weekend is still their biggest hurdle.   Estimated daily fluid intake: 64 oz Supplements: Probiotic, MVI Sleep: Sleeps well Stress / self-care: Moderate Current average weekly physical activity: Walks around the neighborhood   24-Hr Dietary Recall First Meal: Rice Krispies cereal, low fat milk Snack: none Second Meal: Chicken salad, tortilla soup, 1 piece of bread Snack: Pretzels, soda Third Meal: Pork fried rice, egg roll,  Snack: Cherries, water Beverages: Water, Soda    NUTRITION DIAGNOSIS  NB-1.1 Food and nutrition-related knowledge deficit As  related to Overweight.  As evidenced by BMI of 29.08 kg/m2, history of gradual weight gain, food choices high in calories.   NUTRITION INTERVENTION  Nutrition education (E-1) on the following topics:  Educated patient on the two components of energy balance: Energy in (calories), and energy out (activity). Explain the role of negative energy balance in weight loss. Discussed options with patient to achieve a negative energy balance and how to best control energy in and energy out to accommodate their lifestyle.   Handouts Provided Include  AVS  Learning Style & Readiness for Change Teaching method utilized: Visual & Auditory  Demonstrated degree of understanding via: Teach Back  Barriers to learning/adherence to lifestyle change: None  Goals Established by Pt Continue logging your food choices on your FitBit app. Keep up the great work moderating sweetened beverages. Consider trying a greek yogurt (Oikos Triple Zero, Danon Light n' Fit, Chobani) or Facilities manager (Magazine features editor) for a high protein snack. Commit to going for a 30 minute walk around an hour after your breakfast on the weekends. Consider some times during the day to use the exercise facility at work or use FitBit app to increase your physical activity!   MONITORING & EVALUATION Dietary intake, weekly physical activity, and weight loss in 2 months.  Next Steps  Patient is to journal food choices, follow up for third wellness visit.

## 2022-10-12 NOTE — Patient Instructions (Addendum)
Continue logging your food choices on your FitBit app. Keep up the great work moderating sweetened beverages.  Consider trying a greek yogurt (Oikos Triple Zero, Danon Light n' Fit, Chobani) or Facilities manager (Magazine features editor) for a high protein snack.  Commit to going for a 30 minute walk around 45 minutes to an hour after your breakfast on the weekends.  Consider some times during the day to use the exercise facility at work or use FitBit app to increase your physical activity!

## 2022-10-14 ENCOUNTER — Encounter: Payer: Self-pay | Admitting: Internal Medicine

## 2022-10-14 ENCOUNTER — Telehealth (INDEPENDENT_AMBULATORY_CARE_PROVIDER_SITE_OTHER): Payer: 59 | Admitting: Internal Medicine

## 2022-10-14 ENCOUNTER — Telehealth: Payer: Self-pay | Admitting: Internal Medicine

## 2022-10-14 VITALS — BP 121/85 | HR 82 | Temp 97.9°F | Ht 69.0 in | Wt 194.0 lb

## 2022-10-14 DIAGNOSIS — U071 COVID-19: Secondary | ICD-10-CM | POA: Diagnosis not present

## 2022-10-14 MED ORDER — AZITHROMYCIN 250 MG PO TABS: ORAL_TABLET | ORAL | 0 refills | Status: AC

## 2022-10-14 MED ORDER — HYDROCODONE BIT-HOMATROP MBR 5-1.5 MG/5ML PO SOLN: 5.0000 mL | Freq: Three times a day (TID) | ORAL | 0 refills | Status: DC | PRN

## 2022-10-14 MED ORDER — FLUCONAZOLE 150 MG PO TABS: 150.0000 mg | ORAL_TABLET | Freq: Once | ORAL | 0 refills | Status: AC

## 2022-10-14 NOTE — Telephone Encounter (Signed)
Erin [name] [phone]  [name] called to say she tested positive for COVID on Monday and now she has nasal congestion, coughing, headache and body aches. I set her up for video visit at 11:00

## 2022-10-14 NOTE — Progress Notes (Signed)
   Subjective:    Patient ID: Erin [name], female    DOB: 16-Jan-1966, 57 y.o.   MRN: [id number]  HPI   I connected with Erin [name]. [place]ome occurred today by Librarian, academic.  She is at her home and I am at my office.  She is identified using 2 identifiers as Erin [name]. Badman, a longstanding patient in this practice.  She is agreeable to visit in this format today.  Erin [name] has tested positive for COVID-19.  On Friday, August 2 she attended a family reunion out of town.  Her husband also flew to [place] to bring their daughter's car back to [place].  Husband also attended the family reunion.  He now has tested positive for COVID.  On Monday evening August 5, Erin [name] began to have coughing sore throat and nasal congestion.  A number of people that attended the family reunion now have tested positive for COVID-19.  Patient has coughing, sore throat and nasal congestion.  Does not have shaking chills.  Low-grade temp.  She prefers not to take Paxlovid.  She has contacted [facility] regarding her COVID status.  She expects to be out of work for 5 days.  She has no shortness of breath.  No discolored sputum.  Has begun to have fatigue.  No vomiting.  She has a history of hypothyroidism and is followed by Endocrinologist.  History of hyperlipidemia, vitamin D deficiency, history of mild neutropenia consistent with benign ethnic neutropenia which is longstanding.  History of migraine headaches.  History of mild hypertension.  Uses Flonase nasal spray for allergic rhinitis symptoms.  Social history: Married.  2 children, a son and a daughter in good health.  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  No diarrhea.     Objective:   Physical Exam  She is seen virtually in no acute distress.  Is able to give a clear concise history.  She sounds nasally congested when  she speaks.  Does not have tachypnea.      Assessment & Plan:   Acute COVID-19 virus infection  Plan: Patient prefers not to take Paxlovid.  I am sending in Zithromax Z-PAK 2 tabs day 1 followed by 1 tab days 2 through 5 as well as Hycodan syrup 1 teaspoon every 8 hours as needed for cough.  She is to walk some to prevent atelectasis.  Monitor pulse oximetry and call if symptoms worsen.  Expect she will be quarantining for a total of 5 days.  Rest and stay well-hydrated.  May take Diflucan if needed for Candida vaginitis while on antibiotics.  She will call back if she has questions or concerns.  Time spent on this visit including chart review, speaking with patient, medical decision making and the scribing medications is 20 minutes

## 2022-10-14 NOTE — Patient Instructions (Signed)
We are sorry you are not feeling well.  We discussed treatment for COVID-19 virus infection.  I am sending in Zithromax Z-PAK 2 tabs day 1 followed by 1 tab days 2 through 5.  I am sending in Hycodan 1 teaspoon every 8 hours as needed for cough or sore throat pain.  May take Diflucan if needed for Candida vaginitis while on antibiotics.  Walk some to prevent atelectasis.  Monitor pulse oximetry and stay well-hydrated.  Call if have any questions or concerns.  Expect she will be out of work for a total of 5 days.

## 2022-10-15 ENCOUNTER — Other Ambulatory Visit: Payer: Self-pay

## 2022-10-15 ENCOUNTER — Other Ambulatory Visit (HOSPITAL_COMMUNITY): Payer: Self-pay

## 2022-10-15 ENCOUNTER — Other Ambulatory Visit: Payer: Self-pay | Admitting: Internal Medicine

## 2022-10-15 MED ORDER — ALBUTEROL SULFATE HFA 108 (90 BASE) MCG/ACT IN AERS
2.0000 | INHALATION_SPRAY | Freq: Four times a day (QID) | RESPIRATORY_TRACT | 99 refills | Status: AC | PRN
  Filled 2022-10-15: qty 6.7, 25d supply, fill #0
  Filled 2022-11-28: qty 6.7, 25d supply, fill #1

## 2022-10-27 ENCOUNTER — Other Ambulatory Visit: Payer: Self-pay

## 2022-10-29 ENCOUNTER — Other Ambulatory Visit: Payer: Self-pay

## 2022-11-01 ENCOUNTER — Other Ambulatory Visit (HOSPITAL_COMMUNITY): Payer: Self-pay

## 2022-11-03 ENCOUNTER — Other Ambulatory Visit (HOSPITAL_COMMUNITY): Payer: Self-pay

## 2022-11-13 ENCOUNTER — Other Ambulatory Visit (HOSPITAL_COMMUNITY): Payer: Self-pay

## 2022-11-24 ENCOUNTER — Encounter: Payer: Self-pay | Admitting: Internal Medicine

## 2022-11-24 ENCOUNTER — Ambulatory Visit
Admission: RE | Admit: 2022-11-24 | Discharge: 2022-11-24 | Disposition: A | Discharge status: Home / Self Care | Payer: 59 | Source: Ambulatory Visit | Attending: Internal Medicine | Admitting: Internal Medicine

## 2022-11-24 ENCOUNTER — Other Ambulatory Visit (HOSPITAL_COMMUNITY): Payer: Self-pay

## 2022-11-24 ENCOUNTER — Other Ambulatory Visit: Payer: Self-pay

## 2022-11-24 ENCOUNTER — Ambulatory Visit (INDEPENDENT_AMBULATORY_CARE_PROVIDER_SITE_OTHER): Payer: 59 | Admitting: Internal Medicine

## 2022-11-24 VITALS — BP 100/70 | HR 88 | Temp 98.4°F | Ht 69.0 in | Wt 194.0 lb

## 2022-11-24 DIAGNOSIS — R059 Cough, unspecified: Secondary | ICD-10-CM | POA: Diagnosis not present

## 2022-11-24 DIAGNOSIS — J683 Other acute and subacute respiratory conditions due to chemicals, gases, fumes and vapors: Secondary | ICD-10-CM

## 2022-11-24 MED ORDER — HYDROCODONE BIT-HOMATROP MBR 5-1.5 MG/5ML PO SOLN
5.0000 mL | Freq: Three times a day (TID) | ORAL | 0 refills | Status: DC | PRN
  Filled 2022-11-24 (×2): qty 120, 8d supply, fill #0

## 2022-11-24 MED ORDER — METHYLPREDNISOLONE 4 MG PO TABS
ORAL_TABLET | ORAL | 0 refills | Status: DC
  Filled 2022-11-24 (×2): qty 42, 12d supply, fill #0

## 2022-11-24 NOTE — Progress Notes (Signed)
Patient Care Team: Margaree Mackintosh, MD as PCP - General (Internal Medicine)  Visit Date: 11/24/22  Subjective:    Patient ID: Erin [name] , Female   DOB: 03-11-1965, 57 y.o.    MRN: [id number]   57 y.o. Female presents today for persistent cough. Treated here for Covid-19 on 10/14/22 with Z-Pak, Hycodan. Has some wheezing. Coughing does not produce sputum but she feels that something is there in her throat. Denies fever, chills, loss of appetite. Uses an albuterol inhaler, Flonase nasal spray.  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  HENT:  Negative for congestion.   Eyes:  Negative for blurred vision.  Respiratory:  Positive for cough and wheezing. Negative for shortness of breath.   Cardiovascular:  Negative for chest pain, palpitations and leg swelling.  Gastrointestinal:  Negative for vomiting.  Musculoskeletal:  Negative for back pain.  Skin:  Negative for rash.  Neurological:  Negative for loss of consciousness and headaches.        Objective:   Vitals: BP 100/70   Pulse 88   Temp 98.4 F (36.9 C)   Ht 5\' 9"  (1.753 m)   Wt 194 lb (88 kg)   LMP 10/08/2015 (Approximate)   SpO2 97%   BMI 28.65 kg/m    Physical Exam Vitals and nursing note reviewed.  Constitutional:      General: She is not in acute  distress.    Appearance: Normal appearance. She is not toxic-appearing.  HENT:     Head: Normocephalic and atraumatic.  Pulmonary:     Effort: Pulmonary effort is normal.     Comments: Coarse breath sounds.  Skin:    General: Skin is warm and dry.  Neurological:     Mental Status: She is alert and oriented to person, place, and time. Mental status is at baseline.  Psychiatric:        Mood and Affect: Mood normal.        Behavior: Behavior normal.        Thought Content: Thought content normal.        Judgment: Judgment normal.       Results:   Studies obtained and personally reviewed by me:   Labs:       Component Value Date/Time   NA 142 01/27/2022 0908   K 4.1 01/27/2022 0908   CL 107 01/27/2022 0908   CO2 29 01/27/2022 0908   GLUCOSE 90 01/27/2022 0908   BUN 11 01/27/2022 0908   CREATININE 0.66 01/27/2022 0908   CALCIUM 9.1 01/27/2022 0908   PROT 6.7 01/27/2022 0908   ALBUMIN 4.1 01/09/2016 1118  AST 13 01/27/2022 0908   ALT 10 01/27/2022 0908   ALKPHOS 67 01/09/2016 1118   BILITOT 0.4 01/27/2022 0908   GFRNONAA 83 01/23/2020 1109   GFRAA 96 01/23/2020 1109     Lab Results  Component Value Date   WBC 2.9 (L) 01/27/2022   HGB 12.0 01/27/2022   HCT 36.7 01/27/2022   MCV 89.1 01/27/2022   PLT 170 01/27/2022    Lab Results  Component Value Date   CHOL 156 01/27/2022   HDL 62 01/27/2022   LDLCALC 81 01/27/2022   LDLDIRECT 126 01/09/2016   TRIG 58 01/27/2022   CHOLHDL 2.5 01/27/2022    No results found for: "HGBA1C"   Lab Results  Component Value Date   TSH 1.41 01/27/2022      Assessment & Plan:   Persistent upper respiratory infection: prescribed Medrol tapering course take as directed for 12 days, Hycodan syrup 1 tsp every 8 hours as needed for cough. Ordered CXR. Will contact with results. Get plenty of rest and stay well-hydrated.    I,Alexander Ruley,acting as a Neurosurgeon for Margaree Mackintosh, MD.,have documented all relevant documentation  on the behalf of Margaree Mackintosh, MD,as directed by  Margaree Mackintosh, MD while in the presence of Margaree Mackintosh, MD.   I, Margaree Mackintosh, MD, have reviewed all documentation for this visit. The documentation on 12/03/22 for the exam, diagnosis, procedures, and orders are all accurate and complete.

## 2022-11-28 ENCOUNTER — Other Ambulatory Visit (HOSPITAL_COMMUNITY): Payer: Self-pay

## 2022-11-30 ENCOUNTER — Other Ambulatory Visit: Payer: Self-pay

## 2022-12-03 NOTE — Patient Instructions (Signed)
Take Medrol in tapering course as directed. CXR ordered. Rest and stay well hydrated. Take Hycodan if needed for cough.

## 2022-12-17 ENCOUNTER — Other Ambulatory Visit: Payer: Self-pay

## 2022-12-17 ENCOUNTER — Encounter: Payer: 59 | Admitting: Dietician

## 2022-12-17 ENCOUNTER — Encounter: Payer: Self-pay | Admitting: Dietician

## 2022-12-17 VITALS — Ht 69.0 in | Wt 192.2 lb

## 2022-12-17 DIAGNOSIS — E639 Nutritional deficiency, unspecified: Secondary | ICD-10-CM

## 2022-12-17 MED ORDER — COMIRNATY 30 MCG/0.3ML IM SUSY
0.3000 mL | PREFILLED_SYRINGE | Freq: Once | INTRAMUSCULAR | 0 refills | Status: AC
  Filled 2022-12-17: qty 0.3, 1d supply, fill #0

## 2022-12-17 NOTE — Progress Notes (Signed)
Medical Nutrition Therapy  Appointment Start time:  0900  Appointment End time:  0935 Employee Wellness Visit 3 of 3 EID: [id number]  Primary concerns today: Dietary improvement, weight management  Referral diagnosis: Z00.8 - Encounter for nutritional assessment Preferred learning style: No preference indicated Learning readiness: Contemplating   NUTRITION ASSESSMENT   Anthropometrics  Ht: 5'9" Wt: 192.2 lbs Wt Change: -3.5 lbs BMI: 28.38 kg/m2 Weight Loss Goal/Goal Weight: -10 lbs/186 lbs   Clinical Medical Hx: HLD, HTN Medications: Prednisone, Crestor, Amlodipine Labs: N/A Notable Signs/Symptoms: N/A Food Allergies: Shellfish   Lifestyle & Dietary Hx Pt continues to lose weight successfully, states they have been logging weight weekly, avoiding SSBs, eating healthier breakfasts (less high fat/carb), more activity. Pt reports continuing to use FitBit to log weight/food choices/steps, average steps per day over the last month ~15,000 per day. Pt reports lingering pain in their ankle hinders more intense activity. Pt reports only having a big breakfast one day a week now.    Estimated daily fluid intake: 64 oz Supplements: Probiotic, MVI Sleep: Sleeps well Stress / self-care: Moderate Current average weekly physical activity: Walks around the neighborhood   24-Hr Dietary Recall First Meal: [employer] bar, fruit cup Snack: none Second Meal: PB and J, veggie chips Snack: Skinny pop Third Meal: Roast beef, mashed potatoes, brussel sprouts Snack: tangerine Beverages: Water, Soda    NUTRITION DIAGNOSIS  NB-1.1 Food and nutrition-related knowledge deficit As related to Overweight.  As evidenced by BMI of 29.08 kg/m2, history of gradual weight gain, food choices high in calories.   NUTRITION INTERVENTION  Nutrition education (E-1) on the following topics:  Educated patient on the two components of energy balance: Energy in (calories), and energy out (activity).  Explain the role of negative energy balance in weight loss. Discussed options with patient to achieve a negative energy balance and how to best control energy in and energy out to accommodate their lifestyle.   Handouts Provided Include  AVS  Learning Style & Readiness for Change Teaching method utilized: Visual & Auditory  Demonstrated degree of understanding via: Teach Back  Barriers to learning/adherence to lifestyle change: None   Goals Established by Pt Keep up the great work moderating your intake of sugary beverages. You are doing a great job!! Engineer, building services in place of veggie chips for a higher protein, higher fiber "chip" option. If you are looking to up your physical activity, increase the incline on your treadmill to add some extra intensity to your current activity!   MONITORING & EVALUATION Dietary intake, weekly physical activity, and weight loss PRN.  Next Steps  Patient is to reschedule for next years Wellness visits after New Year

## 2022-12-17 NOTE — Patient Instructions (Addendum)
Keep up the great work moderating your intake of sugary beverages. You are doing a great job!!  Engineer, building services in place of veggie chips for a higher protein, higher fiber "chip" option.  If you are looking to up your physical activity, increase the incline on your treadmill to add some extra intensity to your current activity!

## 2022-12-25 ENCOUNTER — Other Ambulatory Visit: Payer: Self-pay

## 2023-01-14 ENCOUNTER — Ambulatory Visit (INDEPENDENT_AMBULATORY_CARE_PROVIDER_SITE_OTHER): Payer: 59 | Admitting: Family Medicine

## 2023-01-14 ENCOUNTER — Encounter: Payer: Self-pay | Admitting: Family Medicine

## 2023-01-14 VITALS — BP 115/85 | Ht 69.0 in | Wt 193.0 lb

## 2023-01-14 DIAGNOSIS — M76821 Posterior tibial tendinitis, right leg: Secondary | ICD-10-CM | POA: Diagnosis not present

## 2023-01-14 NOTE — Progress Notes (Signed)
PCP: Margaree Mackintosh, MD  Subjective:  CC: f/u Rt foot/ankle pain HPI: Patient is a 57 y.o. female here for right foot pain in the setting of posterior tibialis tendinopathy.   Previously seen in July at which time she received an ultrasound-guided posterior tibialis steroid injection.  This injection helped her and relieved her pain for about 3 months.  1 to 2 weeks ago, started noticing return of her symptoms although not as bad as the initial flare.  No specific movement triggers the pain.  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  She denies recent injury.  Unable to use NSAIDs due to allergy, has been using Tylenol fairly regularly with minimal relief.  She has previously tried physical therapy and home exercises.  She also had orthotics made for arch support which she has not always been using due to not fitting in her current shoes.  Current shoes are quite old and worn out.  Past Medical History:  Diagnosis Date   Allergy    Anemia    no meds   Asthma    as child   Complication of anesthesia    Fibrocystic breast disease    Finger mass, right    small finger   Hemorrhoids    Hypertension    Hypothyroidism    PONV (postoperative nausea and vomiting)    Thyroid disease     Current Outpatient Medications on File Prior to Visit  Medication Sig Dispense Refill   albuterol (VENTOLIN HFA) 108 (90 Base) MCG/ACT inhaler Inhale 2 puffs into the lungs every 6 (six) hours as needed for wheezing or shortness of breath. 6.7 g PRN   amLODipine (NORVASC) 2.5 MG tablet Take 1 tablet (2.5 mg total) by mouth daily. 90 tablet 2   butalbital-acetaminophen-caffeine (FIORICET) 50-325-40 MG tablet TAKE 1 TO 2 TABLETS BY MOUTH EVERY 4 TO 6 HOURS AS NEEDED FOR HEADACHE. NO MORE THAN 6 TABLETS IN 24 HOURS 60 tablet 1   diphenhydrAMINE (BENADRYL) 25 MG tablet Take 25 mg by mouth at bedtime.     EPINEPHrine 0.3 mg/0.3 mL IJ SOAJ injection INJECT 0.3 MLS (1 PEN)  INTO THE MUSCLE AS NEEDED FOR ANAPHYLAXIS. 2 each PRN   estradiol (ESTRACE) 0.1 MG/GM vaginal cream Insert 1 gram vaginally every other day for 1- days / then twice weekly 42.5 g 6   fluticasone (FLONASE) 50 MCG/ACT nasal spray Place 2 sprays into both nostrils daily. 48 g 11   HYDROcodone bit-homatropine (HYCODAN) 5-1.5 MG/5ML syrup Take 5 mLs by mouth every 8 (eight) hours as needed for cough. 120 mL 0   hydrocortisone (ANUSOL-HC) 2.5 % rectal cream Apply rectally 2 times daily as directed. 30 g 5   hydrocortisone (ANUSOL-HC) 25 MG suppository Unwrap and insert 1 suppository rectally twice daily as needed for hemorrhoid flare 12 suppository 11   levothyroxine (SYNTHROID) 25 MCG tablet Take 1 tablet (25 mcg total) by mouth every morning on an empty stomach 90 tablet 4   Melatonin 5 MG TABS Take 6 mg daily by mouth.     methylPREDNISolone (MEDROL) 4 MG tablet Take in tapering course as directed 6-6-5-5-4-4-3-3-2-2-1-1 42 tablet 0   mupirocin ointment (BACTROBAN) 2 % Apply 1 Application topically 2 (two) times daily to the affected areas. 22 g 96   ondansetron (ZOFRAN) 4 MG tablet Take 1 tablet (4 mg total) by mouth every 8 (eight) hours as needed for nausea or vomiting. 20  tablet 0   Probiotic Product (PROBIOTIC-10) CHEW Chew by mouth.     rosuvastatin (CRESTOR) 5 MG tablet Take 1 tablet (5 mg total) by mouth 3 (three) times a week. 36 tablet 3   Vitamin D, Ergocalciferol, (DRISDOL) 1.25 MG (50000 UNIT) CAPS capsule Take 1 capsule (50,000 Units total) by mouth once a week. 12 capsule 3   No current facility-administered medications on file prior to visit.    Past Surgical History:  Procedure Laterality Date   BREAST CYST EXCISION Left    BREAST EXCISIONAL BIOPSY Bilateral    BREAST SURGERY     fibrocystic breast/Bil   MASS EXCISION Right 04/25/2019   Procedure: EXCISION MASS DISTAL PHALANX RIGHT SMALL FINGER;  Surgeon: Cindee Salt, MD;  Location: [facility];  Service:  Orthopedics;  Laterality: Right;  IV REGIONAL FOREARM BLOCK   TUBAL LIGATION     WISDOM TOOTH EXTRACTION      Allergies  Allergen Reactions   Aspirin     REACTION: Hives, Wheezing   Nsaids     REACTION: Hives, Wheezing   Other     Other reaction(s): Other (See Comments) REACTION: Hives, Wheezing hives   Shellfish Allergy Other (See Comments)    hives    BP 115/85   Ht 5\' 9"  (1.753 m)   Wt 193 lb (87.5 kg)   LMP 10/08/2015 (Approximate)   BMI 28.50 kg/m       No data to display              No data to display              Objective:  Physical Exam:  Gen: NAD, comfortable in exam room MSK:  R foot: Inspection: Small amount of swelling noted to medial aspect of the foot below the medial malleolus.  No significant deformity, ecchymoses Palpation: Point tenderness posterior to medial malleolus over posterior tibialis tendon.  Some tenderness along proximal dorsum of foot.  Otherwise nontender throughout foot and Achilles tendon ROM: Full range of motion without pain Strength: 5/5 strength  Noted to have overpronation of right foot when standing.  Has some pain with standing on her toes on the right.  Posterior tibialis function is limited on the right with heel raise.    Assessment & Plan:  1.  Right foot pain 2/2 tibialis posterior tendinopathy - Initially improved with steroid injection 10/01/22, but pain has now returned.   - limited improvement with prior HEP and oral steroids - limited improvement with prior PT - last formal PT 06/2022  PLAN: - patient inquiring about repeat cortisone injections.  Upon shared decision making decided against repeat injection due to risk of worsening her symptoms and potential rupture of the tendon.   - Feel she would most benefit from more consistent use of her orthotics and arch support, advised to use shoes that fit the orthotics.  She plans to purchase new shoes that are not wornout.  Will plan to use orthotics regularly  in new shoes - continue OTC Tylenol.   - Follow-up in 4 to 6 weeks as needed, may consider PRP and/or shockwave therapy vs referral to surgeon if no improvement.  Discussed PRP and ESWT in detail, including potential out of pocket costs  Addendum:  Patient seen & examined in the office with resident - Vonna Drafts, MD, PGY-2.  History, exam, plan of care were precepted with me.  Additions and updates made to note above.  Agree with findings and plan  per resident note.  Darene Lamer, DO, CAQSM

## 2023-01-18 ENCOUNTER — Other Ambulatory Visit (HOSPITAL_COMMUNITY): Payer: Self-pay

## 2023-01-26 DIAGNOSIS — Z1211 Encounter for screening for malignant neoplasm of colon: Secondary | ICD-10-CM | POA: Diagnosis not present

## 2023-01-26 DIAGNOSIS — Z1339 Encounter for screening examination for other mental health and behavioral disorders: Secondary | ICD-10-CM | POA: Diagnosis not present

## 2023-01-26 DIAGNOSIS — Z1231 Encounter for screening mammogram for malignant neoplasm of breast: Secondary | ICD-10-CM | POA: Diagnosis not present

## 2023-01-26 DIAGNOSIS — Z124 Encounter for screening for malignant neoplasm of cervix: Secondary | ICD-10-CM | POA: Diagnosis not present

## 2023-01-26 DIAGNOSIS — Z01419 Encounter for gynecological examination (general) (routine) without abnormal findings: Secondary | ICD-10-CM | POA: Diagnosis not present

## 2023-01-26 LAB — HM MAMMOGRAPHY

## 2023-01-27 ENCOUNTER — Other Ambulatory Visit (HOSPITAL_COMMUNITY): Payer: Self-pay

## 2023-01-27 ENCOUNTER — Encounter: Payer: Self-pay | Admitting: Internal Medicine

## 2023-01-27 ENCOUNTER — Other Ambulatory Visit: Payer: Self-pay

## 2023-01-27 MED ORDER — ESTRADIOL 0.1 MG/GM VA CREA
1.0000 g | TOPICAL_CREAM | VAGINAL | 6 refills | Status: DC
  Filled 2023-01-27: qty 42.5, 90d supply, fill #0
  Filled 2023-05-13: qty 42.5, 90d supply, fill #1
  Filled 2023-10-06: qty 42.5, 90d supply, fill #2

## 2023-01-28 ENCOUNTER — Other Ambulatory Visit: Payer: 59

## 2023-01-28 DIAGNOSIS — I10 Essential (primary) hypertension: Secondary | ICD-10-CM

## 2023-01-28 DIAGNOSIS — E7800 Pure hypercholesterolemia, unspecified: Secondary | ICD-10-CM

## 2023-01-28 DIAGNOSIS — Z1329 Encounter for screening for other suspected endocrine disorder: Secondary | ICD-10-CM | POA: Diagnosis not present

## 2023-01-28 DIAGNOSIS — Z0000 Encounter for general adult medical examination without abnormal findings: Secondary | ICD-10-CM

## 2023-01-28 LAB — HM PAP SMEAR: HPV, high-risk: NEGATIVE

## 2023-01-29 LAB — CBC WITH DIFFERENTIAL/PLATELET
Absolute Lymphocytes: 1070 {cells}/uL (ref 850–3900)
Absolute Monocytes: 367 {cells}/uL (ref 200–950)
Basophils Absolute: 31 {cells}/uL (ref 0–200)
Basophils Relative: 1.1 %
Eosinophils Absolute: 70 {cells}/uL (ref 15–500)
Eosinophils Relative: 2.5 %
HCT: 38.4 % (ref 35.0–45.0)
Hemoglobin: 12.5 g/dL (ref 11.7–15.5)
MCH: 28.7 pg (ref 27.0–33.0)
MCHC: 32.6 g/dL (ref 32.0–36.0)
MCV: 88.3 fL (ref 80.0–100.0)
MPV: 10.1 fL (ref 7.5–12.5)
Monocytes Relative: 13.1 %
Neutro Abs: 1263 {cells}/uL — ABNORMAL LOW (ref 1500–7800)
Neutrophils Relative %: 45.1 %
Platelets: 179 10*3/uL (ref 140–400)
RBC: 4.35 10*6/uL (ref 3.80–5.10)
RDW: 12.5 % (ref 11.0–15.0)
Total Lymphocyte: 38.2 %
WBC: 2.8 10*3/uL — ABNORMAL LOW (ref 3.8–10.8)

## 2023-01-29 LAB — LIPID PANEL
Cholesterol: 175 mg/dL (ref <200)
HDL: 60 mg/dL (ref >=50)
LDL Cholesterol (Calc): 98 mg/dL
Non-HDL Cholesterol (Calc): 115 mg/dL (ref <130)
Total CHOL/HDL Ratio: 2.9 (calc) (ref <5.0)
Triglycerides: 79 mg/dL (ref <150)

## 2023-01-29 LAB — COMPLETE METABOLIC PANEL WITH GFR
AG Ratio: 1.8 (calc) (ref 1.0–2.5)
ALT: 13 U/L (ref 6–29)
AST: 14 U/L (ref 10–35)
Albumin: 4.3 g/dL (ref 3.6–5.1)
Alkaline phosphatase (APISO): 60 U/L (ref 37–153)
BUN: 11 mg/dL (ref 7–25)
CO2: 27 mmol/L (ref 20–32)
Calcium: 9.4 mg/dL (ref 8.6–10.4)
Chloride: 107 mmol/L (ref 98–110)
Creat: 0.7 mg/dL (ref 0.50–1.03)
Globulin: 2.4 g/dL (ref 1.9–3.7)
Glucose, Bld: 87 mg/dL (ref 65–99)
Potassium: 4.2 mmol/L (ref 3.5–5.3)
Sodium: 143 mmol/L (ref 135–146)
Total Bilirubin: 0.5 mg/dL (ref 0.2–1.2)
Total Protein: 6.7 g/dL (ref 6.1–8.1)
eGFR: 100 mL/min/{1.73_m2} (ref >=60)

## 2023-01-29 LAB — TSH: TSH: 1.01 m[IU]/L (ref 0.40–4.50)

## 2023-01-31 ENCOUNTER — Other Ambulatory Visit: Payer: Self-pay | Admitting: Internal Medicine

## 2023-02-01 ENCOUNTER — Other Ambulatory Visit: Payer: Self-pay

## 2023-02-01 MED ORDER — VITAMIN D (ERGOCALCIFEROL) 1.25 MG (50000 UNIT) PO CAPS
50000.0000 [IU] | ORAL_CAPSULE | ORAL | 3 refills | Status: DC
  Filled 2023-02-01: qty 12, 84d supply, fill #0
  Filled 2023-04-26: qty 12, 84d supply, fill #1
  Filled 2023-07-16: qty 12, 84d supply, fill #2
  Filled 2023-10-06: qty 12, 84d supply, fill #3

## 2023-02-02 ENCOUNTER — Other Ambulatory Visit: Payer: 59

## 2023-02-03 ENCOUNTER — Other Ambulatory Visit: Payer: Self-pay

## 2023-02-03 ENCOUNTER — Other Ambulatory Visit (HOSPITAL_COMMUNITY): Payer: Self-pay

## 2023-02-03 ENCOUNTER — Other Ambulatory Visit: Payer: Self-pay | Admitting: Endocrinology

## 2023-02-03 ENCOUNTER — Encounter: Payer: Self-pay | Admitting: Internal Medicine

## 2023-02-03 ENCOUNTER — Ambulatory Visit: Payer: 59 | Admitting: Internal Medicine

## 2023-02-03 VITALS — BP 102/80 | HR 74 | Temp 99.4°F | Ht 69.0 in | Wt 195.0 lb

## 2023-02-03 DIAGNOSIS — H6123 Impacted cerumen, bilateral: Secondary | ICD-10-CM | POA: Diagnosis not present

## 2023-02-03 DIAGNOSIS — E010 Iodine-deficiency related diffuse (endemic) goiter: Secondary | ICD-10-CM

## 2023-02-03 MED ORDER — NEOMYCIN-POLYMYXIN-HC 3.5-10000-1 OT SOLN
4.0000 [drp] | Freq: Three times a day (TID) | OTIC | 0 refills | Status: DC
  Filled 2023-02-03: qty 10, 10d supply, fill #0

## 2023-02-05 ENCOUNTER — Other Ambulatory Visit: Payer: Self-pay

## 2023-02-06 NOTE — Progress Notes (Signed)
   Subjective:    Patient ID: Erin [name], female    DOB: [birth year], 57 y.o.   MRN: [id number]  HPI 57 year old Female with history of cerumen accumulating in her ears. Feels she is not hearing as well as usual. Would like ears checked and cerumen removed. No ear pain. No URI Symptoms.  She has a history of hypothyroidism and goiter seen by Dr. Cleon Gustin, endocrinologist.  History of hyperlipidemia, vitamin D deficiency, mild neutropenia consistent with benign ethnic neutropenia which is longstanding.  History of migraine headaches.  Seen by [facility] for GYN exams.  History of uterine fibroids.  History of hemorrhoids treated with Anusol HC suppositories and rectal cream as needed.  History of mild hypertension treated with low-dose amlodipine.  Uses Flonase nasal spray for allergic rhinitis symptoms.  History of vitamin D deficiency treated with high-dose vitamin D supplement.  Social history: Married.  She has 2 children, son and a daughter in good health.  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  Review of Systems see above-no fever, chills, ear pain or upper respiratory congestion     Objective:   Physical Exam  Vital signs: BP 102/80 pulse 74 T 99.4 degrees via ear thermometer. Pulse ox 94% Weight 195lbs. BMI 28.80  There is cerumen in both ear canals today removed gently with curetting.  She tolerated procedure well.  Mild trauma to ear canal.     Assessment & Plan:   Cerumen in external ear canals-removed with curette.  Prescribed Cortisporin otic suspension to use if needed in ear canals after cerumen was removed.  Plan: Return as needed.

## 2023-02-06 NOTE — Patient Instructions (Addendum)
Cerumen removed from ear canals with curette.  Have prescribed Cortisporin otic suspension to use if ear canal is irritated since cerumen was removed with curette.  Return as needed.  As always, it was a pleasure to see you today.

## 2023-02-09 ENCOUNTER — Ambulatory Visit: Payer: 59 | Admitting: Internal Medicine

## 2023-02-09 ENCOUNTER — Encounter: Payer: Self-pay | Admitting: Internal Medicine

## 2023-02-09 VITALS — BP 120/90 | HR 74 | Ht 69.0 in | Wt 194.0 lb

## 2023-02-09 DIAGNOSIS — J309 Allergic rhinitis, unspecified: Secondary | ICD-10-CM

## 2023-02-09 DIAGNOSIS — E038 Other specified hypothyroidism: Secondary | ICD-10-CM

## 2023-02-09 DIAGNOSIS — E049 Nontoxic goiter, unspecified: Secondary | ICD-10-CM | POA: Diagnosis not present

## 2023-02-09 DIAGNOSIS — I10 Essential (primary) hypertension: Secondary | ICD-10-CM

## 2023-02-09 DIAGNOSIS — Z6828 Body mass index (BMI) 28.0-28.9, adult: Secondary | ICD-10-CM | POA: Diagnosis not present

## 2023-02-09 DIAGNOSIS — Z8669 Personal history of other diseases of the nervous system and sense organs: Secondary | ICD-10-CM | POA: Diagnosis not present

## 2023-02-09 DIAGNOSIS — Z0000 Encounter for general adult medical examination without abnormal findings: Secondary | ICD-10-CM

## 2023-02-09 DIAGNOSIS — Z8719 Personal history of other diseases of the digestive system: Secondary | ICD-10-CM | POA: Diagnosis not present

## 2023-02-09 LAB — POCT URINALYSIS DIP (CLINITEK)
Bilirubin, UA: NEGATIVE
Blood, UA: NEGATIVE
Glucose, UA: NEGATIVE mg/dL
Ketones, POC UA: NEGATIVE mg/dL
Leukocytes, UA: NEGATIVE
Nitrite, UA: NEGATIVE
POC PROTEIN,UA: NEGATIVE
Spec Grav, UA: 1.015 (ref 1.010–1.025)
Urobilinogen, UA: 0.2 U/dL
pH, UA: 6 (ref 5.0–8.0)

## 2023-02-09 NOTE — Progress Notes (Signed)
Patient Care Team: Margaree Mackintosh, MD as PCP - General (Internal Medicine)  Visit Date: 02/09/23  Subjective:    Patient ID: Erin [name] , Female   DOB: [birth year], 57 y.o.    MRN: [id number]   57 y.o. Female presents today for a comprehensive physical exam. She has a history of subclinical hypothyroidism and goiter seen by Dr. Talmage Nap, Endocrinologist.  History of capsulitis left foot and ingrown toenail seen by Dr. Al Corpus in 2021.  She has a history of hyperlipidemia, vitamin D deficiency, mild neutropenia consistent with benign ethnic neutropenia which is longstanding.  She takes low-dose statin, Crestor 5 mg 3 times a week.  History of migraine headaches for which she takes Fioricet as needed.  Had one headache recently that resolved with Fioricet. Headaches are infrequent.  She is seen by [facility] for GYN exams.  History of uterine fibroids and menorrhagia.  History of hemorrhoids treated with Anusol HC suppositories and rectal cream as needed.  History of mild hypertension stable on amlodipine 2.5 mg daily.  Uses Flonase nasal spray for allergic rhinitis symptoms.  History of vitamin D deficiency treated with high-dose vitamin D supplement.  Pap smear normal on 01/28/23.  Mammogram last completed 01/26/23.  Had colonoscopy in 2017 with 10-year follow-up recommended.   Social history: Married.  She has 2 children, a son and a daughter in good health.  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  Family history: Mother healthy. 1 brother and 1 sister in good health. Father deceased with history of hypertension and diabetes.   Past Medical History:  Diagnosis Date   Allergy    Anemia    no meds   Asthma    as child   Complication of anesthesia    Fibrocystic breast disease    Finger mass, right    small finger   Hemorrhoids    Hypertension    Hypothyroidism    PONV (postoperative nausea and vomiting)    Thyroid disease       Family History  Problem Relation Age of Onset   Diabetes Mother    Hypertension Mother    Hypertension Father    Diabetes Father    Prostate cancer Maternal Uncle    Hyperlipidemia Other    Breast cancer Neg Hx     Social Hx: Married. Has 2 children, a son and a daughter. 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 Nonsmoker.     Review of Systems  Constitutional:  Negative for chills, fever, malaise/fatigue and weight loss.  HENT:  Negative for hearing loss, sinus pain and sore throat.   Respiratory:  Negative for cough, hemoptysis and shortness of breath.   Cardiovascular:  Negative for chest pain, palpitations, leg swelling and PND.  Gastrointestinal:  Negative for abdominal pain, constipation, diarrhea, heartburn, nausea and vomiting.  Genitourinary:  Negative for dysuria, frequency and urgency.  Musculoskeletal:  Negative for back pain, myalgias and neck pain.  Skin:  Negative for itching and rash.  Neurological:  Negative for dizziness, tingling, seizures and headaches.  Endo/Heme/Allergies:  Negative for polydipsia.  Psychiatric/Behavioral:  Negative for depression. The patient is not nervous/anxious.         Objective:   Vitals: BP (!) 120/90   Pulse 74   Ht 5\' 9"  (1.753 m)   Wt 194 lb (88 kg)   LMP 10/08/2015 (Approximate)   SpO2 98%   BMI 28.65 kg/m  Physical Exam Vitals and nursing note reviewed.  Constitutional:      General: She is not in acute distress.    Appearance: Normal appearance. She is not ill-appearing or toxic-appearing.  HENT:     Head: Normocephalic and atraumatic.     Right Ear: Hearing, tympanic membrane, ear canal and external ear normal.     Left Ear: Hearing, tympanic membrane, ear canal and external ear normal.     Mouth/Throat:     Pharynx: Oropharynx is clear.  Eyes:     Extraocular Movements: Extraocular movements intact.     Pupils: Pupils are equal, round, and reactive to light.  Neck:     Thyroid: No thyroid  mass, thyromegaly or thyroid tenderness.     Vascular: No carotid bruit.  Cardiovascular:     Rate and Rhythm: Normal rate and regular rhythm. No extrasystoles are present.    Heart sounds: Normal heart sounds. No murmur heard.    No friction rub. No gallop.  Pulmonary:     Effort: Pulmonary effort is normal.     Breath sounds: Normal breath sounds. No decreased breath sounds, wheezing, rhonchi or rales.  Chest:     Chest wall: No mass.  Breasts:    Right: No mass.     Left: No mass.  Abdominal:     Palpations: Abdomen is soft. There is no hepatomegaly, splenomegaly or mass.     Tenderness: There is no abdominal tenderness.     Hernia: No hernia is present.  Musculoskeletal:     Cervical back: Normal range of motion.     Right lower leg: No edema.     Left lower leg: No edema.  Lymphadenopathy:     Cervical: No cervical adenopathy.     Upper Body:     Right upper body: No supraclavicular adenopathy.     Left upper body: No supraclavicular adenopathy.  Skin:    General: Skin is warm and dry.  Neurological:     General: No focal deficit present.     Mental Status: She is alert and oriented to person, place, and time. Mental status is at baseline.     Sensory: Sensation is intact.     Motor: Motor function is intact. No weakness.     Deep Tendon Reflexes: Reflexes are normal and symmetric.  Psychiatric:        Attention and Perception: Attention normal.        Mood and Affect: Mood normal.        Speech: Speech normal.        Behavior: Behavior normal.        Thought Content: Thought content normal.        Cognition and Memory: Cognition normal.        Judgment: Judgment normal.       Results:   Studies obtained and personally reviewed by me:  Pap smear normal on 01/28/23.  Mammogram last completed 01/26/23.  Had colonoscopy in 2017 with 10-year follow-up recommended.   Labs:       Component Value Date/Time   NA 143 01/28/2023 0919   K 4.2 01/28/2023 0919    CL 107 01/28/2023 0919   CO2 27 01/28/2023 0919   GLUCOSE 87 01/28/2023 0919   BUN 11 01/28/2023 0919   CREATININE 0.70 01/28/2023 0919   CALCIUM 9.4 01/28/2023 0919   PROT 6.7 01/28/2023 0919   ALBUMIN 4.1 01/09/2016 1118   AST 14 01/28/2023 0919   ALT 13 01/28/2023 0919  ALKPHOS 67 01/09/2016 1118   BILITOT 0.5 01/28/2023 0919   GFRNONAA 83 01/23/2020 1109   GFRAA 96 01/23/2020 1109     Lab Results  Component Value Date   WBC 2.8 (L) 01/28/2023   HGB 12.5 01/28/2023   HCT 38.4 01/28/2023   MCV 88.3 01/28/2023   PLT 179 01/28/2023    Lab Results  Component Value Date   CHOL 175 01/28/2023   HDL 60 01/28/2023   LDLCALC 98 01/28/2023   LDLDIRECT 126 01/09/2016   TRIG 79 01/28/2023   CHOLHDL 2.9 01/28/2023    No results found for: "HGBA1C"   Lab Results  Component Value Date   TSH 1.01 01/28/2023      Assessment & Plan:   Essential hypertension-stable on current regimen of amlodipine 2.5 mg daily   History of goiter followed by Dr. Talmage Nap and TSH is within normal limits on low-dose thyroid suppression therapy. Hx subclinical hypothyroidism per Dr. Talmage Nap.   History of migraine headaches treated with Fioricet   Allergic rhinitis-treated with Flonase nasal spray   Mild hyperlipidemia treated with low-dose Crestor 3 times a week   History of uterine fibroids followed by GYN  Vaccine counseling: UTD on flu, tetanus, shingles vaccines.  Return in 1 year or as needed.No change in medications.    I,Alexander Ruley,acting as a Neurosurgeon for Margaree Mackintosh, MD.,have documented all relevant documentation on the behalf of Margaree Mackintosh, MD,as directed by  Margaree Mackintosh, MD while in the presence of Margaree Mackintosh, MD.   I, Margaree Mackintosh, MD, have reviewed all documentation for this visit. The documentation on 03/04/23 for the exam, diagnosis, procedures, and orders are all accurate and complete.

## 2023-02-11 ENCOUNTER — Other Ambulatory Visit: Payer: 59

## 2023-02-12 ENCOUNTER — Other Ambulatory Visit: Payer: Self-pay

## 2023-02-12 ENCOUNTER — Other Ambulatory Visit: Payer: Self-pay | Admitting: Internal Medicine

## 2023-02-12 ENCOUNTER — Other Ambulatory Visit (HOSPITAL_COMMUNITY): Payer: Self-pay

## 2023-02-12 MED ORDER — BUTALBITAL-APAP-CAFFEINE 50-325-40 MG PO TABS
ORAL_TABLET | ORAL | 1 refills | Status: DC
  Filled 2023-02-12: qty 60, 10d supply, fill #0
  Filled 2023-04-19: qty 60, 10d supply, fill #1

## 2023-02-16 DIAGNOSIS — H93293 Other abnormal auditory perceptions, bilateral: Secondary | ICD-10-CM | POA: Diagnosis not present

## 2023-02-16 DIAGNOSIS — H6121 Impacted cerumen, right ear: Secondary | ICD-10-CM | POA: Diagnosis not present

## 2023-02-16 DIAGNOSIS — H5213 Myopia, bilateral: Secondary | ICD-10-CM | POA: Diagnosis not present

## 2023-02-18 ENCOUNTER — Ambulatory Visit
Admission: RE | Admit: 2023-02-18 | Discharge: 2023-02-18 | Disposition: A | Discharge status: Home / Self Care | Payer: 59 | Source: Ambulatory Visit | Attending: Endocrinology | Admitting: Endocrinology

## 2023-02-18 DIAGNOSIS — E010 Iodine-deficiency related diffuse (endemic) goiter: Secondary | ICD-10-CM

## 2023-02-18 DIAGNOSIS — E049 Nontoxic goiter, unspecified: Secondary | ICD-10-CM | POA: Diagnosis not present

## 2023-02-22 ENCOUNTER — Other Ambulatory Visit: Payer: Self-pay | Admitting: Endocrinology

## 2023-02-22 DIAGNOSIS — E049 Nontoxic goiter, unspecified: Secondary | ICD-10-CM

## 2023-02-23 ENCOUNTER — Other Ambulatory Visit (HOSPITAL_COMMUNITY): Payer: Self-pay

## 2023-02-23 DIAGNOSIS — I10 Essential (primary) hypertension: Secondary | ICD-10-CM | POA: Diagnosis not present

## 2023-02-23 DIAGNOSIS — E010 Iodine-deficiency related diffuse (endemic) goiter: Secondary | ICD-10-CM | POA: Diagnosis not present

## 2023-02-23 DIAGNOSIS — E039 Hypothyroidism, unspecified: Secondary | ICD-10-CM | POA: Diagnosis not present

## 2023-02-23 MED ORDER — LEVOTHYROXINE SODIUM 25 MCG PO TABS
25.0000 ug | ORAL_TABLET | Freq: Every morning | ORAL | 5 refills | Status: DC
  Filled 2023-04-09 – 2023-04-15 (×2): qty 90, 90d supply, fill #0
  Filled 2023-07-15: qty 90, 90d supply, fill #1

## 2023-03-04 NOTE — Patient Instructions (Signed)
As always it was a pleasure to see you today.  Labs are stable.  Continue current medications and return in 1 year or as needed.  Continue follow-up with Dr. Talmage Nap, Endocrinologist

## 2023-03-07 ENCOUNTER — Other Ambulatory Visit: Payer: Self-pay | Admitting: Internal Medicine

## 2023-03-08 ENCOUNTER — Other Ambulatory Visit: Payer: Self-pay

## 2023-03-08 ENCOUNTER — Other Ambulatory Visit: Payer: Self-pay | Admitting: Internal Medicine

## 2023-03-08 MED ORDER — HYDROCORTISONE (PERIANAL) 2.5 % EX CREA
1.0000 | TOPICAL_CREAM | Freq: Two times a day (BID) | CUTANEOUS | 5 refills | Status: AC
  Filled 2023-03-08: qty 30, 10d supply, fill #0

## 2023-03-09 ENCOUNTER — Other Ambulatory Visit: Payer: Self-pay

## 2023-03-11 ENCOUNTER — Other Ambulatory Visit: Payer: Self-pay

## 2023-03-11 MED ORDER — HYDROCORTISONE ACETATE 25 MG RE SUPP
RECTAL | 11 refills | Status: AC
  Filled 2023-03-11: qty 12, 6d supply, fill #0
  Filled 2023-10-19: qty 12, 6d supply, fill #1
  Filled 2024-01-23: qty 12, 6d supply, fill #2

## 2023-03-12 ENCOUNTER — Other Ambulatory Visit: Payer: Self-pay

## 2023-04-09 ENCOUNTER — Other Ambulatory Visit (HOSPITAL_COMMUNITY): Payer: Self-pay

## 2023-04-09 ENCOUNTER — Other Ambulatory Visit: Payer: Self-pay | Admitting: Internal Medicine

## 2023-04-10 ENCOUNTER — Other Ambulatory Visit (HOSPITAL_COMMUNITY): Payer: Self-pay

## 2023-04-12 ENCOUNTER — Other Ambulatory Visit: Payer: Self-pay

## 2023-04-12 ENCOUNTER — Other Ambulatory Visit (HOSPITAL_COMMUNITY): Payer: Self-pay

## 2023-04-12 MED ORDER — FLUTICASONE PROPIONATE 50 MCG/ACT NA SUSP
2.0000 | Freq: Every day | NASAL | 11 refills | Status: DC
  Filled 2023-04-12: qty 48, 90d supply, fill #0
  Filled 2023-10-06: qty 48, 90d supply, fill #1
  Filled 2023-12-24: qty 48, 90d supply, fill #2

## 2023-04-12 MED ORDER — ROSUVASTATIN CALCIUM 5 MG PO TABS
5.0000 mg | ORAL_TABLET | ORAL | 3 refills | Status: DC
  Filled 2023-04-12: qty 36, 84d supply, fill #0
  Filled 2023-07-03: qty 36, 84d supply, fill #1
  Filled 2023-10-06: qty 36, 84d supply, fill #2
  Filled 2024-01-04: qty 36, 84d supply, fill #3

## 2023-04-13 ENCOUNTER — Other Ambulatory Visit: Payer: Self-pay

## 2023-04-15 ENCOUNTER — Other Ambulatory Visit (HOSPITAL_COMMUNITY): Payer: Self-pay

## 2023-04-15 ENCOUNTER — Other Ambulatory Visit: Payer: Self-pay | Admitting: Internal Medicine

## 2023-04-15 ENCOUNTER — Other Ambulatory Visit: Payer: Self-pay

## 2023-04-15 MED ORDER — AMLODIPINE BESYLATE 2.5 MG PO TABS
2.5000 mg | ORAL_TABLET | Freq: Every day | ORAL | 2 refills | Status: DC
  Filled 2023-04-15: qty 90, 90d supply, fill #0
  Filled 2023-07-15: qty 90, 90d supply, fill #1
  Filled 2023-10-10: qty 90, 90d supply, fill #2

## 2023-04-16 ENCOUNTER — Other Ambulatory Visit: Payer: Self-pay | Admitting: Medical Genetics

## 2023-04-20 ENCOUNTER — Other Ambulatory Visit (HOSPITAL_COMMUNITY): Payer: Self-pay

## 2023-04-20 ENCOUNTER — Other Ambulatory Visit: Payer: Self-pay

## 2023-04-26 ENCOUNTER — Other Ambulatory Visit (HOSPITAL_COMMUNITY): Payer: Self-pay

## 2023-04-26 ENCOUNTER — Other Ambulatory Visit: Payer: Self-pay

## 2023-05-04 ENCOUNTER — Other Ambulatory Visit (HOSPITAL_COMMUNITY): Payer: Self-pay

## 2023-05-13 ENCOUNTER — Other Ambulatory Visit: Payer: Self-pay

## 2023-05-13 ENCOUNTER — Other Ambulatory Visit (HOSPITAL_COMMUNITY): Payer: Self-pay

## 2023-06-17 ENCOUNTER — Other Ambulatory Visit
Admission: RE | Admit: 2023-06-17 | Discharge: 2023-06-17 | Disposition: A | Discharge status: Home / Self Care | Payer: Self-pay | Source: Ambulatory Visit | Attending: Medical Genetics | Admitting: Medical Genetics

## 2023-06-17 ENCOUNTER — Encounter: Payer: Self-pay | Attending: Internal Medicine | Admitting: Dietician

## 2023-06-17 ENCOUNTER — Encounter: Payer: Self-pay | Admitting: Dietician

## 2023-06-17 ENCOUNTER — Other Ambulatory Visit: Payer: Self-pay

## 2023-06-17 VITALS — Ht 69.0 in | Wt 192.3 lb

## 2023-06-17 DIAGNOSIS — E639 Nutritional deficiency, unspecified: Secondary | ICD-10-CM

## 2023-06-17 MED ORDER — PREVNAR 20 0.5 ML IM SUSY
PREFILLED_SYRINGE | INTRAMUSCULAR | 0 refills | Status: DC
  Filled 2023-06-17: qty 0.5, 1d supply, fill #0

## 2023-06-17 NOTE — Progress Notes (Signed)
 Medical Nutrition Therapy  Appointment Start time:  [phone]  Appointment End time:  1000 Employee Wellness Visit 1 of 3 EID#: [id number]  Primary concerns today: Weight Loss  Referral diagnosis: Z71.3 - Encounter for nutritional counseling Preferred learning style: No preference indicated Learning readiness: Change in progress   NUTRITION ASSESSMENT   Anthropometrics: Ht: 69" Wt: 192.3 lbs BMI: 28.40 kg/m2 Goal Weight: 185 lbs.  Clinical Medical Hx: HTN, HLD, Goiter, Chronic benign neutropenia Medications: Rosuvastatin, Amlodipine, Fioricet, Levothyroxine, Vit D Labs (01/28/2023): WBC - 2.8 Notable Signs/Symptoms: N/A  Lifestyle & Dietary Hx Pt reports continuing goal of weight loss towards goal weight of 185 lbs. Pt reports weighing weekly and has been able to maintain previous weight loss. Pt reports continuing to track food and activity on FitBit app, states their biggest hurdle is tracking dinner meals and not entering food into app until later in the day. Average daily steps on FitBit app - 15,762 Pt states they feel like at times their portion sizes are too large, or they will have seconds at dinner when they know they shouldn't. Pt reports not eating after 7:00 pm most of the time, may snack on potato or veggie chips.  Pt reports working out 2-3 times a week, using row machine at work for 15 minutes 3 times weekly, states they are not walking for exercise currently. Pt reports a dip in motivation to exercise in recent months, states allergies are a deterrent as well.   Estimated daily fluid intake: 48-64 oz Supplements: MVI, Probiotic Sleep: Sleeps well Stress / self-care: Moderate (Family living situation) Current average weekly physical activity: ADLs, rowing machine, walks   24-Hr Dietary Recall First Meal: Apple cinnamon instant oatmeal, OJ Snack: none Second Meal: Ham sandwich, pickles, banana pudding Snack: Grapes, small bowl of banana pudding Third Meal: Sweet  potato, mac and cheese, cabbage, grilled pork Snack: none Beverages: Water, OJ    NUTRITION DIAGNOSIS  NB-1.1 Food and nutrition-related knowledge deficit As related to overweight.  As evidenced by BMI of 28.40 kg/m2.   NUTRITION INTERVENTION  Nutrition education (E-1) on the following topics:  Educated patient on the two components of energy balance: Energy in (calories), and energy out (activity). Explain the role of negative energy balance in weight loss. Discussed options with patient to achieve a negative energy balance and how to best control energy in and energy out to accommodate their lifestyle.  Handouts Provided Include  Hypertension Nutrition Therapy  Balanced Plate   Learning Style & Readiness for Change Teaching method utilized: Visual & Auditory  Demonstrated degree of understanding via: Teach Back  Barriers to learning/adherence to lifestyle change: None  Goals Established by Pt Work on tracking food choices as soon as you eat them. Focus on tracking your dinner meals daily, and log ALL snacks! Use your Hand Gestures Guide for a good reference of portion sizes! Remember your proportions of carbs, protein, and veggies at your meals! When consuming high calorie foods (Mac and cheese, ice cream, baked goods, desserts, chips, breakfast meats), make a concerted effort to lower your portion sizes! Remember, consistently having less of these foods cuts a significant amount of calories. Switch from Lactaid to Fairlife 2% milk for a higher protein, lower sugar option.  Cut back on your sodium intake, try to have keep your daily sodium under 2,300 mg! Use your Hypertension Nutrition Therapy for tips to lower sodium!   MONITORING & EVALUATION Dietary intake, weekly physical activity, and weight change in 2-3 months.  Next  Steps  Patient is to call to schedule wellness visit 2 of 3.

## 2023-06-17 NOTE — Patient Instructions (Addendum)
 Work on [employer] as soon as you eat them. Focus on tracking your dinner meals daily, and log ALL snacks! Use your Hand Gestures Guide for a good reference of portion sizes!  Remember your proportions of carbs, protein, and veggies at your meals!  When consuming high calorie foods (Mac and cheese, ice cream, baked goods, desserts, chips, breakfast meats), make a concerted effort to lower your portion sizes! Remember, consistently having less of these foods cuts a significant amount of calories.  Switch from Lactaid to Fairlife 2% milk for a higher protein, lower sugar option.   Cut back on your sodium intake, try to have keep your daily sodium under 2,300 mg! Use your Hypertension Nutrition Therapy for tips to lower sodium!

## 2023-06-22 ENCOUNTER — Other Ambulatory Visit (HOSPITAL_COMMUNITY): Payer: Self-pay

## 2023-06-24 ENCOUNTER — Other Ambulatory Visit: Payer: Self-pay

## 2023-06-29 LAB — GENECONNECT MOLECULAR SCREEN: Genetic Analysis Overall Interpretation: NEGATIVE

## 2023-07-03 ENCOUNTER — Other Ambulatory Visit (HOSPITAL_COMMUNITY): Payer: Self-pay

## 2023-07-05 ENCOUNTER — Other Ambulatory Visit: Payer: Self-pay

## 2023-07-05 ENCOUNTER — Other Ambulatory Visit (HOSPITAL_COMMUNITY): Payer: Self-pay

## 2023-07-05 ENCOUNTER — Other Ambulatory Visit: Payer: Self-pay | Admitting: Internal Medicine

## 2023-07-05 MED ORDER — LEVOTHYROXINE SODIUM 25 MCG PO TABS
25.0000 ug | ORAL_TABLET | Freq: Every morning | ORAL | 1 refills | Status: AC
  Filled 2023-07-05 – 2023-10-06 (×2): qty 90, 90d supply, fill #0
  Filled 2024-01-04: qty 90, 90d supply, fill #1

## 2023-07-06 ENCOUNTER — Other Ambulatory Visit (HOSPITAL_COMMUNITY): Payer: Self-pay

## 2023-07-16 ENCOUNTER — Other Ambulatory Visit (HOSPITAL_COMMUNITY): Payer: Self-pay

## 2023-07-16 ENCOUNTER — Other Ambulatory Visit: Payer: Self-pay

## 2023-07-17 ENCOUNTER — Other Ambulatory Visit (HOSPITAL_COMMUNITY): Payer: Self-pay

## 2023-07-19 ENCOUNTER — Other Ambulatory Visit (HOSPITAL_COMMUNITY): Payer: Self-pay

## 2023-08-31 ENCOUNTER — Ambulatory Visit: Payer: Self-pay

## 2023-08-31 NOTE — Telephone Encounter (Signed)
 FYI Only or Action Required?: FYI only for provider.  Patient was last seen in primary care on 02/09/2023 by Baxley, Ronal PARAS, MD. Called Nurse Triage reporting Mass. Symptoms began several days ago. Interventions attempted: OTC medications: muscle rub. Symptoms are: stable.  Triage Disposition: See Physician Within 24 Hours  Patient/caregiver understands and will follow disposition?: No, refuses disposition                             Copied from CRM [id number]. Topic: Clinical - Red Word Triage >> Aug 31, 2023  9:09 AM Elle L wrote: Red Word that prompted transfer to Nurse Triage: The patient states she has a swollen area on lower back above her buttock. She states it may be a lymph node as it is the size of a boiled egg. It started swelling yesterday. She denied having any further symptoms. Reason for Disposition  [1] Swelling is painful to touch AND [2] no fever  Answer Assessment - Initial Assessment Questions 1. APPEARANCE of SWELLING: What does it look like?     Raised area, about the size of a small egg 2. SIZE: How large is the swelling? (e.g., inches, cm; or compare to size of pinhead, tip of pen, eraser, coin, pea, grape, ping pong ball)      Size of a small egg 3. LOCATION: Where is the swelling located?     Right lower back, above buttock 4. ONSET: When did the swelling start?     1-2 days 5. COLOR: What color is it? Is there more than one color?     Denies 6. PAIN: Is there any pain? If Yes, ask: How bad is the pain? (e.g., scale 1-10; or mild, moderate, severe)     - NONE (0): no pain   - MILD (1-3): doesn't interfere with normal activities    - MODERATE (4-7): interferes with normal activities or awakens from sleep    - SEVERE (8-10): excruciating pain, unable to do any normal activities     Rates soreness 1-2  7. ITCH: Does it itch? If Yes, ask: How bad is the itch?      Denies 8. CAUSE: What do you think caused the  swelling?     Unsure 9. OTHER SYMPTOMS: Do you have any other symptoms? (e.g., fever)     Denies fever, denies cold/flu symptoms    Patient declined appointment and stated she would need something after 4pm Patient stated she would call back if symptoms worsened  Protocols used: Skin Lump or Localized Swelling-A-AH

## 2023-09-16 ENCOUNTER — Encounter: Payer: Self-pay | Admitting: Dietician

## 2023-09-16 ENCOUNTER — Ambulatory Visit: Payer: Self-pay | Attending: Internal Medicine | Admitting: Dietician

## 2023-09-16 VITALS — Ht 69.0 in | Wt 188.1 lb

## 2023-09-16 DIAGNOSIS — E639 Nutritional deficiency, unspecified: Secondary | ICD-10-CM | POA: Insufficient documentation

## 2023-09-16 NOTE — Patient Instructions (Addendum)
 Keep up the great work!!  Continue to walk at work and in the evenings!

## 2023-09-16 NOTE — Progress Notes (Signed)
 Medical Nutrition Therapy  Appointment Start time:  1130  Appointment End time:  1200 Employee Wellness Visit 2 of 3 EID#: [id number]  Primary concerns today: Weight Loss  Referral diagnosis: Z71.3 - Encounter for nutritional counseling Preferred learning style: No preference indicated Learning readiness: Change in progress   NUTRITION ASSESSMENT   Anthropometrics: Ht: 69 Wt: 188.1 lbs BMI: 27.78 kg/m2 Goal Weight: 185 lbs. Wt Change: -4 lbs x3 months  Clinical Medical Hx: HTN, HLD, Goiter, Chronic benign neutropenia Medications: Rosuvastatin , Amlodipine , Fioricet , Levothyroxine , Vit D Labs (01/28/2023): WBC - 2.8 Notable Signs/Symptoms: N/A   Lifestyle & Dietary Hx Pt reports continuing goal of weight loss towards goal weight of 185 lbs.  Pt reports tracking food on FitBit app, RD reviewed food log. Pt reports monitoring salt intake, adding less at the table, and trying to build meals along the lines of the Balanced Plate. Pt reports decreasing SSB consumption, snack less at night, having a lighter breakfast (breakfast bars), eating more fruit, less breads/desserts.  Pt reports having difficulty with eating starches (potatoes, rice, starch) at dinner meals, but feels more in control. Pt reports remaining active at work, walking the trails at work 1-2 days a week and at home in the evenings, ~2 miles both times, using row machine for ~15 minutes at work.    Estimated daily fluid intake: 48-64 oz Supplements: MVI, Probiotic Sleep: Sleeps well Stress / self-care: Moderate (Family living situation) Current average weekly physical activity: ADLs, rowing machine, walks   24-Hr Dietary Recall First Meal: PB [id number] 10 oz sweet tea Snack: Watermelon Second Meal: Tuna fish sandwich, Veggie chips Snack:  Third Meal: Pork loin, squash, green beans, cole slaw Snack: couple of cherries Beverages: Water, Raspberry Lemonade, Sweet tea    NUTRITION DIAGNOSIS  NB-1.1  Food and nutrition-related knowledge deficit As related to overweight.  As evidenced by BMI of 27.78 kg/m2.   NUTRITION INTERVENTION  Nutrition education (E-1) on the following topics:  Educated patient on the two components of energy balance: Energy in (calories), and energy out (activity). Explain the role of negative energy balance in weight loss. Discussed options with patient to achieve a negative energy balance and how to best control energy in and energy out to accommodate their lifestyle.  Handouts Provided Include  Hypertension Nutrition Therapy  Balanced Plate   Learning Style & Readiness for Change Teaching method utilized: Visual & Auditory  Demonstrated degree of understanding via: Teach Back  Barriers to learning/adherence to lifestyle change: None  Goals Established by Pt Keep up the great work!! Continue to walk at work and in the evenings!    MONITORING & EVALUATION Dietary intake, weekly physical activity, and weight change in 2-3 months.  Next Steps  Patient is to call to schedule wellness visit 3 of 3.

## 2023-09-20 ENCOUNTER — Other Ambulatory Visit (HOSPITAL_COMMUNITY): Payer: Self-pay

## 2023-09-20 ENCOUNTER — Encounter: Payer: Self-pay | Admitting: Internal Medicine

## 2023-09-20 ENCOUNTER — Ambulatory Visit (INDEPENDENT_AMBULATORY_CARE_PROVIDER_SITE_OTHER): Payer: Self-pay | Admitting: Internal Medicine

## 2023-09-20 ENCOUNTER — Other Ambulatory Visit: Payer: Self-pay

## 2023-09-20 VITALS — BP 110/80 | HR 80 | Ht 69.0 in | Wt 191.0 lb

## 2023-09-20 DIAGNOSIS — R0683 Snoring: Secondary | ICD-10-CM

## 2023-09-20 DIAGNOSIS — R229 Localized swelling, mass and lump, unspecified: Secondary | ICD-10-CM | POA: Diagnosis not present

## 2023-09-20 DIAGNOSIS — D171 Benign lipomatous neoplasm of skin and subcutaneous tissue of trunk: Secondary | ICD-10-CM

## 2023-09-20 MED ORDER — MUPIROCIN 2 % EX OINT
1.0000 | TOPICAL_OINTMENT | Freq: Two times a day (BID) | CUTANEOUS | 0 refills | Status: AC
  Filled 2023-09-20: qty 22, 11d supply, fill #0

## 2023-09-20 NOTE — Progress Notes (Signed)
 Patient Care Team: Perri Ronal PARAS, MD as PCP - General (Internal Medicine)  Visit Date: 09/20/23  Subjective:   Chief Complaint  Patient presents with   Mass    Patient states she noticed a bump in her lower back a few weeks ago it feels sore.    Patient PI:Erin [name], [place] DOB:[birth year],58 y.o. FMW:[id number]   58 y.o.Female presents today for acute sick visit with Lower Back Pain. Patient has a past medical history of Hypertension; Hypothyroidism, S/p Mass Excision Distal Phalanx, Right Small Finger. As mentioned above, a few weeks ago she noticed a bump in her right lower back, which is sore on palpation. Denies injury prior to noticing nodule.  Mentions her husband has noticed her snoring, but couldn't determine whether she experiences apneic episodes.   Requests refill of Mupirocin  ointment, which she used during a MRSA infection and subsequently during times of WBC elevation.  Past Medical History:  Diagnosis Date   Allergy    Anemia    no meds   Asthma    as child   Complication of anesthesia    Fibrocystic breast disease    Finger mass, right    small finger   Hemorrhoids    Hypertension    Hypothyroidism    PONV (postoperative nausea and vomiting)    Thyroid  disease     Allergies  Allergen Reactions   Aspirin     REACTION: Hives, Wheezing   Nsaids     REACTION: Hives, Wheezing   Other     Other reaction(s): Other (See Comments) REACTION: Hives, Wheezing hives   Shellfish Allergy Other (See Comments)    hives    Past Surgical History:  Procedure Laterality Date   BREAST CYST EXCISION Left    BREAST EXCISIONAL BIOPSY Bilateral    BREAST SURGERY     fibrocystic breast/Bil   MASS EXCISION Right 04/25/2019   Procedure: EXCISION MASS DISTAL PHALANX RIGHT SMALL FINGER;  Surgeon: Murrell Kuba, MD;  Location: [facility];  Service: Orthopedics;  Laterality: Right;  IV REGIONAL FOREARM BLOCK   TUBAL LIGATION     WISDOM TOOTH EXTRACTION       Family History  Problem Relation Age of Onset   Diabetes Mother    Hypertension Mother    Hypertension Father    Diabetes Father    Prostate cancer Maternal Uncle    Hyperlipidemia Other    Breast cancer Neg Hx    Social History   Social History Narrative   Not on file  Works as a Transport planner  Review of Systems  Respiratory:         (+) Snoring  Musculoskeletal:  Positive for back pain (nodule, right-side lower back, on palpation).     Objective:  Vitals: BP 110/80   Pulse 80   Ht 5' 9 (1.753 m)   Wt 191 lb (86.6 kg)   LMP 10/08/2015 (Approximate)   SpO2 97%   BMI 28.21 kg/m   Physical Exam Vitals and nursing note reviewed.  Constitutional:      General: She is not in acute distress.    Appearance: Normal appearance. She is not toxic-appearing.  HENT:     Head: Normocephalic and atraumatic.  Pulmonary:     Effort: Pulmonary effort is normal.  Musculoskeletal:     Comments: Lumbar: small ~3 cm in diameter, soft nodule right-side of lumbar spine, mild tenderness on palpation  Skin:    General: Skin is warm and dry.  Neurological:  Mental Status: She is alert and oriented to person, place, and time. Mental status is at baseline.  Psychiatric:        Mood and Affect: Mood normal.        Behavior: Behavior normal.        Thought Content: Thought content normal.        Judgment: Judgment normal.     Results:  Studies Obtained And Personally Reviewed By Me: Labs:     Component Value Date/Time   NA 143 01/28/2023 0919   K 4.2 01/28/2023 0919   CL 107 01/28/2023 0919   CO2 27 01/28/2023 0919   GLUCOSE 87 01/28/2023 0919   BUN 11 01/28/2023 0919   CREATININE 0.70 01/28/2023 0919   CALCIUM  9.4 01/28/2023 0919   PROT 6.7 01/28/2023 0919   ALBUMIN 4.1 01/09/2016 1118   AST 14 01/28/2023 0919   ALT 13 01/28/2023 0919   ALKPHOS 67 01/09/2016 1118   BILITOT 0.5 01/28/2023 0919   GFRNONAA 83 01/23/2020 1109   GFRAA 96 01/23/2020 1109    Lab Results   Component Value Date   WBC 2.8 (L) 01/28/2023   HGB 12.5 01/28/2023   HCT 38.4 01/28/2023   MCV 88.3 01/28/2023   PLT 179 01/28/2023   Lab Results  Component Value Date   CHOL 175 01/28/2023   HDL 60 01/28/2023   LDLCALC 98 01/28/2023   LDLDIRECT 126 01/09/2016   TRIG 79 01/28/2023   CHOLHDL 2.9 01/28/2023    Lab Results  Component Value Date   TSH 1.01 01/28/2023    Assessment & Plan:   Meds ordered this encounter  Medications   mupirocin  ointment (BACTROBAN ) 2 %    Sig: Apply 1 Application topically 2 (two) times daily.    Dispense:  22 g    Refill:  0   Nodule, Right Lower Back: noticed a few weeks ago w/o prior injury. On exam there is very mild soreness on palpation and per pt it is not currently interfering with her daily activities. Instructed her to monitor nodule, contact us  if pain worsens or nodule rapidly grows .  Snoring: per her husband's observation, but he could not determine whether she experiences apneic episodes.   Hx of MRSA Infection: she requests a refill of Mupirocin  ointment, which she used during a MRSA infection and subsequently during times of WBC elevation.   Return in about 5 months (around 02/10/2024) for annual labs and then on 02/15/2024 annual visit, or as needed.    I,Emily Lagle,acting as a Neurosurgeon for [name], MD.,have documented all relevant documentation on the behalf of [name], MD,as directed by  [name], MD while in the presence of [name], MD.   ***

## 2023-09-21 ENCOUNTER — Encounter: Payer: Self-pay | Admitting: Internal Medicine

## 2023-09-21 NOTE — Patient Instructions (Signed)
 Have refilled Mupirocin  ointment. I believe you have a lipoma and no treatment is needed at this time. Also, we can make referral to Pulmonary for home sleep study if you are concerned about sleep apnea.

## 2023-10-06 ENCOUNTER — Other Ambulatory Visit (HOSPITAL_COMMUNITY): Payer: Self-pay

## 2023-10-06 ENCOUNTER — Other Ambulatory Visit: Payer: Self-pay

## 2023-10-10 ENCOUNTER — Other Ambulatory Visit: Payer: Self-pay | Admitting: Internal Medicine

## 2023-10-11 ENCOUNTER — Other Ambulatory Visit (HOSPITAL_BASED_OUTPATIENT_CLINIC_OR_DEPARTMENT_OTHER): Payer: Self-pay

## 2023-10-11 ENCOUNTER — Other Ambulatory Visit (HOSPITAL_COMMUNITY): Payer: Self-pay

## 2023-10-11 ENCOUNTER — Other Ambulatory Visit: Payer: Self-pay

## 2023-10-11 MED ORDER — BUTALBITAL-APAP-CAFFEINE 50-325-40 MG PO TABS
1.0000 | ORAL_TABLET | ORAL | 1 refills | Status: AC | PRN
  Filled 2023-10-11: qty 60, 10d supply, fill #0
  Filled 2024-02-08: qty 60, 10d supply, fill #1

## 2023-10-20 ENCOUNTER — Other Ambulatory Visit (HOSPITAL_COMMUNITY): Payer: Self-pay

## 2023-11-16 ENCOUNTER — Encounter: Payer: Self-pay | Admitting: Dietician

## 2023-11-16 ENCOUNTER — Encounter: Payer: Self-pay | Attending: Internal Medicine | Admitting: Dietician

## 2023-11-16 VITALS — Ht 69.0 in | Wt 188.5 lb

## 2023-11-16 DIAGNOSIS — E639 Nutritional deficiency, unspecified: Secondary | ICD-10-CM | POA: Insufficient documentation

## 2023-11-16 NOTE — Progress Notes (Signed)
 Medical Nutrition Therapy  Appointment Start time:  [phone]  Appointment End time:  0830 Employee Wellness Visit 3 of 3 EID#: [id number]  Primary concerns today: Weight Loss  Referral diagnosis: Z71.3 - Encounter for nutritional counseling Preferred learning style: No preference indicated Learning readiness: Change in progress   NUTRITION ASSESSMENT   Anthropometrics: Ht: 69 Wt: 188.5 lbs BMI: 27.78 kg/m2 Goal Weight: 185 lbs. Wt Change: +0.4 lbs x3 months   Clinical Medical Hx: HTN, HLD, Goiter, Chronic benign neutropenia Medications: Rosuvastatin , Amlodipine , Fioricet , Levothyroxine , Vit D Labs (01/28/2023): WBC - 2.8 Notable Signs/Symptoms: N/A   Lifestyle & Dietary Hx Pt reports continuing to track foods for breakfast and lunch, usually doesn't track dinner meals. Pt reports difficulty with larger starch portions, getting seconds at dinner. Pt reports continuing to limit SSBs, eating a lighter breakfast daily. Pt states they had numerous events/trips over the summer and felt like they didn't eat the best while there. Pt reports walking ~3 days a week, 2 miles at a time, using elliptical occasionally too, has not been using row machine recently.   Estimated daily fluid intake: 48-64 oz Supplements: MVI, Probiotic Sleep: Sleeps well Stress / self-care: Moderate (Family living situation) Current average weekly physical activity: ADLs, rowing machine, walks, elliptical   24-Hr Dietary Recall First Meal: [employer], OJ Snack:  Second Meal: Chicken noodle soup, watermelon Snack: Black cherry yogurt, 3 nabs Third Meal: Spaghetti w/ meat sauce, green beans, potatoes, 1/2 apple Snack:  Beverages: Water, OJ    NUTRITION DIAGNOSIS  NB-1.1 Food and nutrition-related knowledge deficit As related to overweight.  As evidenced by BMI of 27.84 kg/m2.   NUTRITION INTERVENTION  Nutrition education (E-1) on the following topics:  Educated patient on the two  components of energy balance: Energy in (calories), and energy out (activity). Explain the role of negative energy balance in weight loss. Discussed options with patient to achieve a negative energy balance and how to best control energy in and energy out to accommodate their lifestyle.  Handouts Provided Include  Hypertension Nutrition Therapy  Balanced Plate   Learning Style & Readiness for Change Teaching method utilized: Visual & Auditory  Demonstrated degree of understanding via: Teach Back  Barriers to learning/adherence to lifestyle change: None  Goals Established by Pt Work on adjusting the proportions of your dinner meal closer to the balanced plate  A source of carbohydrate as 25% of your meal. A source of protein 25% of your meal. Non-starchy vegetables as the remaining 50% of your meal. Work on being more mindful during your dinner meals. Be deliberate and take your time. Have one bite and put your utensil down, chew your bite completely and swallow before picking up your utensil for another.    MONITORING & EVALUATION Dietary intake, weekly physical activity, and weight change PRN  Next Steps  Patient is to call to schedule wellness visits next calendar year

## 2023-11-16 NOTE — Patient Instructions (Addendum)
 Work on adjusting the proportions of your dinner meal closer to the balanced plate  A source of carbohydrate as 25% of your meal. A source of protein 25% of your meal. Non-starchy vegetables as the remaining 50% of your meal.  Work on being more mindful during your dinner meals. Be deliberate and take your time. Have one bite and put your utensil down, chew your bite completely and swallow before picking up your utensil for another.

## 2023-12-21 ENCOUNTER — Other Ambulatory Visit: Payer: Self-pay | Admitting: Obstetrics and Gynecology

## 2023-12-21 DIAGNOSIS — Z1231 Encounter for screening mammogram for malignant neoplasm of breast: Secondary | ICD-10-CM

## 2023-12-24 ENCOUNTER — Other Ambulatory Visit: Payer: Self-pay

## 2023-12-24 ENCOUNTER — Other Ambulatory Visit: Payer: Self-pay | Admitting: Internal Medicine

## 2023-12-24 ENCOUNTER — Other Ambulatory Visit (HOSPITAL_COMMUNITY): Payer: Self-pay

## 2023-12-24 MED ORDER — VITAMIN D (ERGOCALCIFEROL) 1.25 MG (50000 UNIT) PO CAPS
50000.0000 [IU] | ORAL_CAPSULE | ORAL | 3 refills | Status: AC
  Filled 2023-12-24: qty 12, 84d supply, fill #0
  Filled 2024-03-11: qty 12, 84d supply, fill #1

## 2024-01-04 ENCOUNTER — Other Ambulatory Visit (HOSPITAL_COMMUNITY): Payer: Self-pay

## 2024-01-04 ENCOUNTER — Other Ambulatory Visit: Payer: Self-pay | Admitting: Internal Medicine

## 2024-01-05 ENCOUNTER — Other Ambulatory Visit: Payer: Self-pay

## 2024-01-05 ENCOUNTER — Other Ambulatory Visit (HOSPITAL_COMMUNITY): Payer: Self-pay

## 2024-01-05 MED ORDER — AMLODIPINE BESYLATE 2.5 MG PO TABS
2.5000 mg | ORAL_TABLET | Freq: Every day | ORAL | 2 refills | Status: AC
  Filled 2024-01-05: qty 90, 90d supply, fill #0
  Filled 2024-04-12: qty 90, 90d supply, fill #1

## 2024-01-11 ENCOUNTER — Other Ambulatory Visit: Payer: Self-pay

## 2024-01-11 MED ORDER — COMIRNATY 30 MCG/0.3ML IM SUSY
0.3000 mL | PREFILLED_SYRINGE | Freq: Once | INTRAMUSCULAR | 0 refills | Status: AC
  Filled 2024-01-11: qty 0.3, 1d supply, fill #0

## 2024-01-23 ENCOUNTER — Other Ambulatory Visit: Payer: Self-pay | Admitting: Internal Medicine

## 2024-01-24 ENCOUNTER — Other Ambulatory Visit (HOSPITAL_COMMUNITY): Payer: Self-pay

## 2024-01-24 ENCOUNTER — Other Ambulatory Visit: Payer: Self-pay

## 2024-01-24 MED ORDER — EPINEPHRINE 0.3 MG/0.3ML IJ SOAJ
0.3000 mg | INTRAMUSCULAR | 99 refills | Status: AC | PRN
  Filled 2024-01-24: qty 2, 30d supply, fill #0

## 2024-01-27 ENCOUNTER — Other Ambulatory Visit (HOSPITAL_COMMUNITY): Payer: Self-pay

## 2024-01-27 ENCOUNTER — Other Ambulatory Visit: Payer: Self-pay

## 2024-01-27 ENCOUNTER — Ambulatory Visit
Admission: RE | Admit: 2024-01-27 | Discharge: 2024-01-27 | Disposition: A | Source: Ambulatory Visit | Attending: Obstetrics and Gynecology | Admitting: Obstetrics and Gynecology

## 2024-01-27 DIAGNOSIS — Z1211 Encounter for screening for malignant neoplasm of colon: Secondary | ICD-10-CM | POA: Diagnosis not present

## 2024-01-27 DIAGNOSIS — Z1231 Encounter for screening mammogram for malignant neoplasm of breast: Secondary | ICD-10-CM

## 2024-01-27 DIAGNOSIS — Z1330 Encounter for screening examination for mental health and behavioral disorders, unspecified: Secondary | ICD-10-CM | POA: Diagnosis not present

## 2024-01-27 DIAGNOSIS — Z01419 Encounter for gynecological examination (general) (routine) without abnormal findings: Secondary | ICD-10-CM | POA: Diagnosis not present

## 2024-01-27 DIAGNOSIS — Z124 Encounter for screening for malignant neoplasm of cervix: Secondary | ICD-10-CM | POA: Diagnosis not present

## 2024-01-27 DIAGNOSIS — N958 Other specified menopausal and perimenopausal disorders: Secondary | ICD-10-CM | POA: Diagnosis not present

## 2024-01-27 MED ORDER — ESTRADIOL 0.01 % VA CREA
1.0000 g | TOPICAL_CREAM | VAGINAL | 6 refills | Status: AC
  Filled 2024-01-27 – 2024-01-28 (×3): qty 42.5, 90d supply, fill #0

## 2024-01-28 ENCOUNTER — Other Ambulatory Visit: Payer: Self-pay

## 2024-02-01 ENCOUNTER — Other Ambulatory Visit (HOSPITAL_COMMUNITY): Payer: Self-pay

## 2024-02-01 ENCOUNTER — Ambulatory Visit: Admitting: Internal Medicine

## 2024-02-01 VITALS — BP 120/80 | HR 77 | Ht 69.0 in | Wt 192.0 lb

## 2024-02-01 DIAGNOSIS — R0683 Snoring: Secondary | ICD-10-CM

## 2024-02-01 DIAGNOSIS — G43019 Migraine without aura, intractable, without status migrainosus: Secondary | ICD-10-CM | POA: Diagnosis not present

## 2024-02-01 DIAGNOSIS — I10 Essential (primary) hypertension: Secondary | ICD-10-CM

## 2024-02-01 MED ORDER — METHYLPREDNISOLONE 4 MG PO TABS
ORAL_TABLET | ORAL | 1 refills | Status: DC
  Filled 2024-02-01 (×2): qty 21, 6d supply, fill #0

## 2024-02-01 NOTE — Progress Notes (Signed)
 Annual Comprehensive Physical Exam   Patient Care Team: Perri Ronal PARAS, MD as PCP - General (Internal Medicine)  Visit Date: 02/15/24   Chief Complaint  Patient presents with   Annual Exam   Subjective:  Patient: Erin [name], Female DOB: 11-14-1965, 58 y.o. MRN: [id number] Vitals:   02/15/24 1459  BP: 110/80   Erin [name] is a 58 y.o. Female who presents today for her Annual Comprehensive Physical Exam. Patient has Allergic rhinitis; PELVIC SPRAIN AND STRAINS; Migraine headache; Hemorrhoids; Fibrocystic breast disease; Goiter; Right knee pain; Left shoulder pain; Left knee pain; Chronic benign neutropenia; and Finger mass, right on their problem list.  She says that her left hip has been causing her pain. She says that her hip will hurt at the end of the day and she has felt this pain for four months. She will treat the pain with tylenol  and use a heating pad on the area.   history of subclinical hypothyroidism and goiter seen by Dr. Tommas, Endocrinologist. Treated with Levothyroxine  25 mcg daily. 02/10/2024 TSH 0.835  History of capsulitis left foot and ingrown toenail seen by Dr. Verta in 2021.      She has a history of mild neutropenia consistent with benign ethnic neutropenia which is longstanding. 02/10/2024 CBC WBC 3.3, Absolute neutrophils 1.5, otherwise WNL.  History of Hyperlipidemia treated with Crestor  5 mg 3 times a week. 02/10/2024 Lipid Panel LDL 100, otherwise WNL. Discussed coronary calcium  scoring.     History of migraine headaches for which she takes Fioricet  as needed.  SABRA Headaches are infrequent. She had one yesterday. She was last here on 02/01/2024 for intractable migraine.   She is seen by [facility] for GYN exams.  History of uterine fibroids and menorrhagia.      History of hemorrhoids treated with Anusol  HC suppositories and rectal cream as needed.     History of mild hypertension stable on amlodipine  2.5 mg daily. Blood pressure  today is normal at 110/80.      Uses Flonase  nasal spray for allergic rhinitis symptoms.   History of vitamin D  deficiency treated with high-dose vitamin D  supplement.    Labs 02/10/2024 WBC 3.3, absolute neutrophils 1.5, LDL 100, Otherwise WNL.   01/27/2024 Mammogram  no mammographic evidence of malignancy. Repeat in ten years.   07/26/2015 Colonoscopy Internal hemorrhoids.The entire examined colon is otherwise normal. Repeat in 10 years.   Health Maintenance  Topic Date Due   Hepatitis B Vaccines 19-59 Average Risk (1 of 3 - 19+ 3-dose series) Never done   COVID-19 Vaccine (9 - Mixed Product risk 2025-26 season) 07/10/2024   Colonoscopy  07/25/2025   DTaP/Tdap/Td (3 - Td or Tdap) 01/20/2026   Mammogram  01/26/2026   Cervical Cancer Screening (HPV/Pap Cotest)  01/28/2028   Pneumococcal Vaccine: 50+ Years  Completed   Influenza Vaccine  Completed   Zoster Vaccines- Shingrix  Completed   HPV VACCINES  Aged Out   Meningococcal B Vaccine  Aged Out   Hepatitis C Screening  Discontinued   HIV Screening  Discontinued    Review of Systems  Constitutional:  Negative for fever and malaise/fatigue.  HENT:  Negative for congestion.   Eyes:  Negative for blurred vision.  Respiratory:  Negative for cough and shortness of breath.   Cardiovascular:  Negative for chest pain, palpitations and leg swelling.  Gastrointestinal:  Negative for vomiting.  Musculoskeletal:  Negative for back pain.  Skin:  Negative for rash.  Neurological:  Negative for loss of consciousness and headaches.   Objective:  Vitals: body mass index is 28.62 kg/m. Today's Vitals   02/15/24 1459  BP: 110/80  Pulse: 70  SpO2: 98%  Weight: 191 lb (86.6 kg)  Height: 5' 8.5 (1.74 m)  PainSc: 0-No pain   Physical Exam Vitals and nursing note reviewed.  Constitutional:      General: She is not in acute distress.    Appearance: Normal appearance. She is not ill-appearing or toxic-appearing.  HENT:     Head:  Normocephalic and atraumatic.     Right Ear: Hearing, tympanic membrane, ear canal and external ear normal.     Left Ear: Hearing, tympanic membrane, ear canal and external ear normal.     Mouth/Throat:     Pharynx: Oropharynx is clear.  Eyes:     Extraocular Movements: Extraocular movements intact.     Pupils: Pupils are equal, round, and reactive to light.  Neck:     Thyroid : No thyroid  mass, thyromegaly or thyroid  tenderness.     Vascular: No carotid bruit.  Cardiovascular:     Rate and Rhythm: Normal rate and regular rhythm. No extrasystoles are present.    Pulses:          Dorsalis pedis pulses are 2+ on the right side and 2+ on the left side.     Heart sounds: Normal heart sounds. No murmur heard.    No friction rub. No gallop.  Pulmonary:     Effort: Pulmonary effort is normal.     Breath sounds: Normal breath sounds. No decreased breath sounds, wheezing, rhonchi or rales.  Chest:     Chest wall: No mass.  Abdominal:     Palpations: Abdomen is soft. There is no hepatomegaly, splenomegaly or mass.     Tenderness: There is no abdominal tenderness.     Hernia: No hernia is present.  Genitourinary:    Comments: Exam deferred to GYN.  Musculoskeletal:     Cervical back: Normal range of motion.     Right lower leg: No edema.     Left lower leg: No edema.  Lymphadenopathy:     Cervical: No cervical adenopathy.     Upper Body:     Right upper body: No supraclavicular adenopathy.     Left upper body: No supraclavicular adenopathy.  Skin:    General: Skin is warm and dry.  Neurological:     General: No focal deficit present.     Mental Status: She is alert and oriented to person, place, and time. Mental status is at baseline.     Sensory: Sensation is intact.     Motor: Motor function is intact. No weakness.     Deep Tendon Reflexes: Reflexes are normal and symmetric.  Psychiatric:        Attention and Perception: Attention normal.        Mood and Affect: Mood normal.         Speech: Speech normal.        Behavior: Behavior normal.        Thought Content: Thought content normal.        Cognition and Memory: Cognition normal.        Judgment: Judgment normal.     Current Outpatient Medications  Medication Instructions   albuterol  (VENTOLIN  HFA) 108 (90 Base) MCG/ACT inhaler 2 puffs, Inhalation, Every 6 hours PRN   amLODipine  (NORVASC ) 2.5 mg, Oral, Daily   bisacodyl (DULCOLAX) 5 mg, Daily PRN  butalbital -acetaminophen -caffeine  (FIORICET ) 50-325-40 MG tablet Take 1-2 tablets by mouth every 4 (four) to 6 (six) hours as needed for headache. No more than 6 tablets per 24 hours.   diphenhydrAMINE  (BENADRYL ) 25 mg, Nightly   EPINEPHrine  0.3 mg/0.3 mL IJ SOAJ injection INJECT 0.3 MLS (1 PEN) INTO THE MUSCLE AS NEEDED FOR ANAPHYLAXIS.   estradiol  (ESTRACE ) 0.01 % CREA vaginal cream Insert 1 gram vaginally every other day for 14 days, then twice weekly   fluticasone  (FLONASE ) 50 MCG/ACT nasal spray 2 sprays, Each Nare, Daily   hydrocortisone  (ANUSOL -HC) 25 MG suppository Unwrap and insert 1 suppository rectally twice daily as needed for hemorrhoid flare   hydrocortisone  (PROCTOZONE -HC) 2.5 % rectal cream Apply rectally 2 times daily as directed.   levothyroxine  (SYNTHROID ) 25 MCG tablet Take 1 tablet (25 mcg total) by mouth every morning on an empty stomach.   melatonin 6 mg, Daily   mupirocin  ointment (BACTROBAN ) 2 % 1 Application, Topical, 2 times daily   ondansetron  (ZOFRAN ) 4 mg, Oral, Every 8 hours PRN   Probiotic Product (PROBIOTIC-10) CHEW Chew by mouth.   rizatriptan  (MAXALT ) 10 mg, Oral, As needed, May repeat in 2 hours if needed   rosuvastatin  (CRESTOR ) 5 mg, Oral, 3 times weekly   Vitamin D  (Ergocalciferol ) (DRISDOL ) 50,000 Units, Oral, Weekly   Past Medical History:  Diagnosis Date   Allergy    Anemia    no meds   Asthma    as child   Complication of anesthesia    Fibrocystic breast disease    Finger mass, right    small finger    Hemorrhoids    Hypertension    Hypothyroidism    PONV (postoperative nausea and vomiting)    Thyroid  disease    Medical/Surgical History Narrative:  Allergic/Intolerant to:  Allergies  Allergen Reactions   Aspirin     REACTION: Hives, Wheezing   Nsaids     REACTION: Hives, Wheezing   Other     Other reaction(s): Other (See Comments) REACTION: Hives, Wheezing hives   Shellfish Allergy Other (See Comments)    hives    Past Surgical History:  Procedure Laterality Date   BREAST CYST EXCISION Left    BREAST EXCISIONAL BIOPSY Bilateral    BREAST SURGERY     fibrocystic breast/Bil   MASS EXCISION Right 04/25/2019   Procedure: EXCISION MASS DISTAL PHALANX RIGHT SMALL FINGER;  Surgeon: Murrell Kuba, MD;  Location: [facility];  Service: Orthopedics;  Laterality: Right;  IV REGIONAL FOREARM BLOCK   TUBAL LIGATION     WISDOM TOOTH EXTRACTION     Family History  Problem Relation Age of Onset   Diabetes Mother    Hypertension Mother    Hypertension Father    Diabetes Father    Cancer Father    Prostate cancer Maternal Uncle    Hyperlipidemia Other    Breast cancer Neg Hx    Family History Narrative: Married. Has 2 children, a son and a daughter. 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     Most Recent Health Risks Assessment:   Most Recent Social Determinants of Health (Including Hx of Tobacco, Alcohol, and Drug Use) SDOH Screenings   Food Insecurity: No Food Insecurity (02/01/2024)  Housing: Low Risk  (02/01/2024)  Transportation Needs: No Transportation Needs (02/01/2024)  Alcohol Screen: Low Risk  (02/01/2024)  Depression (PHQ2-9): Low Risk  (09/20/2023)  Financial Resource Strain: Low Risk  (02/01/2024)  Physical Activity: Insufficiently Active (02/01/2024)  Social Connections: Moderately Integrated (02/01/2024)  Stress: Stress Concern Present (02/01/2024)  Tobacco Use: Low Risk  (02/15/2024)   Social History   Tobacco Use    Smoking status: Never   Smokeless tobacco: Never  Substance Use Topics   Alcohol use: Yes    Comment: Socially rarely   Drug use: Never   Most Recent Fall Risk Assessment:    09/18/2022    8:11 AM  Fall Risk   Falls in the past year? 0   Most Recent Anxiety/Depression Screenings:    09/20/2023   10:29 AM 09/18/2022    8:11 AM  PHQ 2/9 Scores  PHQ - 2 Score 0 0    Results:  Studies Obtained And Personally Reviewed By Me:  01/27/2024 Mammogram  no mammographic evidence of malignancy. Repeat in ten years.   07/26/2015 Colonoscopy Internal hemorrhoids.The entire examined colon is otherwise normal. Repeat in 10 years.    Labs:  CBC w/ Differential Lab Results  Component Value Date   WBC 3.3 (L) 02/10/2024   RBC 4.21 02/10/2024   HGB 12.3 02/10/2024   HCT 37.8 02/10/2024   PLT 158 02/10/2024   MCV 89.8 02/10/2024   MCH 29.2 02/10/2024   MCHC 32.5 02/10/2024   RDW 13.8 02/10/2024   MPV 10.1 01/28/2023   LYMPHSABS 1.2 02/10/2024   MONOABS 0.4 02/10/2024   BASOSABS 0.0 02/10/2024    Comprehensive Metabolic Panel Lab Results  Component Value Date   NA 142 02/10/2024   K 4.0 02/10/2024   CL 107 02/10/2024   CO2 28 02/10/2024   GLUCOSE 93 02/10/2024   BUN 11 02/10/2024   CREATININE 0.65 02/10/2024   CALCIUM  9.1 02/10/2024   PROT 7.0 02/10/2024   ALBUMIN 4.1 02/10/2024   AST 16 02/10/2024   ALT 20 02/10/2024   ALKPHOS 65 02/10/2024   BILITOT 0.3 02/10/2024   EGFR 100 01/28/2023   GFRNONAA >60 02/10/2024   Lipid Panel  Lab Results  Component Value Date   CHOL 178 02/10/2024   HDL 67 02/10/2024   LDLCALC 100 (H) 02/10/2024   TRIG 54 02/10/2024   TSH Lab Results  Component Value Date   TSH 0.835 02/10/2024    Assessment & Plan:   Orders Placed This Encounter  Procedures   DG HIP UNILAT WITH PELVIS 2-3 VIEWS LEFT    Reason for Exam (SYMPTOM  OR DIAGNOSIS REQUIRED):   pain left hip x 4 months    Is patient pregnant?:   No    Preferred imaging  location?:   [id number]   Meds ordered this encounter  Medications   rizatriptan  (MAXALT ) 10 MG tablet    Sig: Take 1 tablet (10 mg total) by mouth as needed for migraine. May repeat in 2 hours if needed    Dispense:  10 tablet    Refill:  1   Left Hip pain: She says that her left hip has been causing her pain. She says that her hip will hurt at the end of the day and she has felt this pain for four months. She will treat the pain with tylenol  and use a heating pad on the area.    Left Hip X-ray ordered.   Subclinical hypothyroidism and goiter: seen by Dr. Tommas, Endocrinologist. Treated with Levothyroxine  25 mcg daily. 02/10/2024 TSH 0.835  mild neutropenia: consistent with benign ethnic neutropenia which is longstanding. 02/10/2024 CBC WBC 3.3, Absolute neutrophils 1.5, otherwise WNL.  Hyperlipidemia: treated with Crestor  5 mg 3 times a week. 02/10/2024  Lipid Panel LDL 100, otherwise WNL. Discussed coronary calcium  scoring.     Migraine headaches for which she takes Fioricet  as needed.  SABRA Headaches are infrequent. She had one yesterday. She was last here on 02/01/2024 for intractable migraine.   Maxalt  10 mg as needed prescribed.   Mild hypertension: stable on amlodipine  2.5 mg daily. Blood pressure today is normal at 110/80.      Vitamin D  deficiency: treated with high-dose vitamin D  supplement.   01/27/2024 Mammogram  no mammographic evidence of malignancy. Repeat in ten years.   07/26/2015 Colonoscopy Internal hemorrhoids.The entire examined colon is otherwise normal. Repeat in 10 years.      Annual Comprehensive Physical Exam done today including the all of the following: Reviewed patient's Family Medical History Reviewed patient's SDOH and reviewed tobacco, alcohol, and drug use.  Reviewed and updated list of patient's medical providers Assessment of cognitive impairment was done Assessed patient's functional ability Established a written schedule for health  screening services Health Risk Assessent Completed and Reviewed  Discussed health benefits of physical activity, and encouraged her to engage in regular exercise appropriate for her age and condition.    I,Makayla C Reid,acting as a scribe for [name], MD.,have documented all relevant documentation on the behalf of [name], MD,as directed by  [name], MD while in the presence of [name], MD.  I, [name], MD, have reviewed all documentation for and agree with the above Annual Wellness Visit documentation.  [name], MD Internal Medicine 02/15/2024

## 2024-02-01 NOTE — Progress Notes (Signed)
 Patient Care Team: Perri Ronal PARAS, MD as PCP - General (Internal Medicine)  Visit Date: 02/01/24  Subjective:    Patient ID: Erin [name] , Female   DOB: 1965-10-09, 58 y.o.    MRN: [id number]   58 y.o. Female presents today for Migraine headache. Patient has a past medical history of hyperlipidemia, Vitamin D  deficiency, Migraine headaches.  History of Migraine Headaches treated with Fioricet  50-325-40 mg table 1-2 tablets every 4-6 hours.  On Monday of last week, she had a migraine that lasted 4 or 5 days. She had a headache this morning but it was not as severe. She has been taking Fiorcet every day since last Monday. She said the medication dulled the migraine but it did not relieve it. She has been dealing with some situational stress.   History of Hypertension treated with Amlodipine  2.5 mg Erin. Her blood pressure has been elevated recently and she thinks it is due to pain from migraine headache that is protracted. Blood pressure today is normal at 120/80.    Husband has mentioned that she snores when she sleeps. She takes Melatonin and Benadryl  at bedtime.   Consider sleep study.  Vaccine counseling: Received Influenza vaccine on October 20th. UTD on Covid-19, Pneumonia vaccine.  Past Medical History:  Diagnosis Date   Allergy    Anemia    no meds   Asthma    as child   Complication of anesthesia    Fibrocystic breast disease    Finger mass, right    small finger   Hemorrhoids    Hypertension    Hypothyroidism    PONV (postoperative nausea and vomiting)    Thyroid  disease      Family History  Problem Relation Age of Onset   Diabetes Mother    Hypertension Mother    Hypertension Father    Diabetes Father    Prostate cancer Maternal Uncle    Hyperlipidemia Other    Breast cancer Neg Hx    Social History: Married. Has 2 children, a son and a daughter. 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      Review of Systems  Neurological:   Positive for headaches.        Objective:   Vitals: BP 120/80   Pulse 77   Ht 5' 9 (1.753 m)   Wt 192 lb (87.1 kg)   LMP 10/08/2015 (Approximate)   SpO2 98%   BMI 28.35 kg/m    Physical Exam Vitals and nursing note reviewed.  Constitutional:      General: She is not in acute distress.    Appearance: Normal appearance. She is not toxic-appearing.  HENT:     Head: Normocephalic and atraumatic.  Cardiovascular:     Rate and Rhythm: Normal rate and regular rhythm. No extrasystoles are present.    Pulses: Normal pulses.     Heart sounds: Normal heart sounds. No murmur heard.    No friction rub. No gallop.  Pulmonary:     Effort: Pulmonary effort is normal. No respiratory distress.     Breath sounds: Normal breath sounds. No wheezing or rales.  Skin:    General: Skin is warm and dry.  Neurological:     Mental Status: She is alert and oriented to person, place, and time. Mental status is at baseline.  Psychiatric:        Mood and Affect: Mood normal.        Behavior: Behavior normal.  Thought Content: Thought content normal.        Judgment: Judgment normal.       Results:    Labs:       Component Value Date/Time   NA 143 01/28/2023 0919   K 4.2 01/28/2023 0919   CL 107 01/28/2023 0919   CO2 27 01/28/2023 0919   GLUCOSE 87 01/28/2023 0919   BUN 11 01/28/2023 0919   CREATININE 0.70 01/28/2023 0919   CALCIUM  9.4 01/28/2023 0919   PROT 6.7 01/28/2023 0919   ALBUMIN 4.1 01/09/2016 1118   AST 14 01/28/2023 0919   ALT 13 01/28/2023 0919   ALKPHOS 67 01/09/2016 1118   BILITOT 0.5 01/28/2023 0919   GFRNONAA 83 01/23/2020 1109   GFRAA 96 01/23/2020 1109     Lab Results  Component Value Date   WBC 2.8 (L) 01/28/2023   HGB 12.5 01/28/2023   HCT 38.4 01/28/2023   MCV 88.3 01/28/2023   PLT 179 01/28/2023    Lab Results  Component Value Date   CHOL 175 01/28/2023   HDL 60 01/28/2023   LDLCALC 98 01/28/2023   LDLDIRECT 126 01/09/2016   TRIG 79  01/28/2023   CHOLHDL 2.9 01/28/2023     Lab Results  Component Value Date   TSH 1.01 01/28/2023        Assessment & Plan:   Meds ordered this encounter  Medications   methylPREDNISolone  (MEDROL ) 4 MG tablet    Sig: Take in tapering course as directed 6-5-4-3-2-1    Dispense:  21 tablet    Refill:  1   Orders Placed This Encounter  Procedures   Ambulatory referral to Pulmonology    Referral Priority:   Routine    Referral Type:   Consultation    Referral Reason:   Specialty Services Required    Requested Specialty:   Pulmonary Disease    Number of Visits Requested:   1    Intractable migraine without aura and without status migrainous: treated with Fioricet  50-325-40 mg table 1-2 tablets every 4-6 hours. Monday of last week, she had a migraine that lasted 4 or 5 days. She had a headache this morning but it was not as severe. She has been taking Fiorcet every day since last Monday. She said the medication dulled the migraine but it did not relieve it. She has been dealing with some situational stress.    Medrol  4 mg tapering course 6-5-4-3-2-1 prescribed.   Hypertension: treated with Amlodipine  2.5 mg Erin. Her blood pressure has been elevated recently but she belies that it is due to to migraines. Blood pressure today is normal at 120/80.    Snoring: Husband has mentioned that she snores when she sleeps. She takes Melatonin and benadryl  at bedtime.     Referred to pulmonary.   Vaccine counseling: Received Influenza vaccine on October 20th. UTD on Covid-19, Pneumonia vaccines.   I,Makayla C Reid,acting as a scribe for [name], MD.,have documented all relevant documentation on the behalf of [name], MD,as directed by  [name], MD while in the presence of [name], MD.

## 2024-02-05 ENCOUNTER — Encounter: Payer: Self-pay | Admitting: Internal Medicine

## 2024-02-05 NOTE — Patient Instructions (Addendum)
 Referral to Pulmonary to evaluate snoring and possible sleep apnea.Blood pressure is stable on amlodipine . Take  Medrol  4 mg tablets to take  in tapering course as directed for protracted migraine headache. Contact us  if not improving in 24-48 hours or sooner if worse.

## 2024-02-08 ENCOUNTER — Other Ambulatory Visit: Payer: Self-pay

## 2024-02-08 ENCOUNTER — Other Ambulatory Visit (HOSPITAL_COMMUNITY): Payer: Self-pay

## 2024-02-09 ENCOUNTER — Telehealth: Payer: Self-pay | Admitting: Internal Medicine

## 2024-02-09 NOTE — Telephone Encounter (Unsigned)
 Copied from CRM #[id number]. Topic: Clinical - Lab/Test Results >> Feb 09, 2024  8:42 AM Tiffini S wrote: Reason for CRM: Patient states that her spouse is having schedule at the [facility]- want to go to lab and he labwork completed there  Cancelled appointment for 02/10/24- called [facility], to verify if lab work could be completed at hospital, no answer.   Please call the patient at [phone] to discuss.

## 2024-02-10 ENCOUNTER — Other Ambulatory Visit: Payer: 59

## 2024-02-10 ENCOUNTER — Other Ambulatory Visit
Admission: RE | Admit: 2024-02-10 | Discharge: 2024-02-10 | Disposition: A | Source: Ambulatory Visit | Attending: Internal Medicine | Admitting: Internal Medicine

## 2024-02-10 ENCOUNTER — Encounter: Payer: Self-pay | Admitting: Internal Medicine

## 2024-02-10 ENCOUNTER — Other Ambulatory Visit: Payer: Self-pay

## 2024-02-10 ENCOUNTER — Other Ambulatory Visit: Payer: Self-pay | Admitting: Internal Medicine

## 2024-02-10 DIAGNOSIS — E049 Nontoxic goiter, unspecified: Secondary | ICD-10-CM

## 2024-02-10 DIAGNOSIS — Z5181 Encounter for therapeutic drug level monitoring: Secondary | ICD-10-CM

## 2024-02-10 DIAGNOSIS — Z0000 Encounter for general adult medical examination without abnormal findings: Secondary | ICD-10-CM

## 2024-02-10 DIAGNOSIS — Z79899 Other long term (current) drug therapy: Secondary | ICD-10-CM | POA: Diagnosis not present

## 2024-02-10 DIAGNOSIS — E038 Other specified hypothyroidism: Secondary | ICD-10-CM

## 2024-02-10 DIAGNOSIS — J683 Other acute and subacute respiratory conditions due to chemicals, gases, fumes and vapors: Secondary | ICD-10-CM

## 2024-02-10 DIAGNOSIS — Z1329 Encounter for screening for other suspected endocrine disorder: Secondary | ICD-10-CM

## 2024-02-10 DIAGNOSIS — I10 Essential (primary) hypertension: Secondary | ICD-10-CM | POA: Diagnosis not present

## 2024-02-10 DIAGNOSIS — Z8669 Personal history of other diseases of the nervous system and sense organs: Secondary | ICD-10-CM

## 2024-02-10 DIAGNOSIS — Z13220 Encounter for screening for lipoid disorders: Secondary | ICD-10-CM

## 2024-02-10 DIAGNOSIS — J309 Allergic rhinitis, unspecified: Secondary | ICD-10-CM

## 2024-02-10 LAB — COMPREHENSIVE METABOLIC PANEL WITH GFR
ALT: 20 U/L (ref 0–44)
AST: 16 U/L (ref 15–41)
Albumin: 4.1 g/dL (ref 3.5–5.0)
Alkaline Phosphatase: 65 U/L (ref 38–126)
Anion gap: 7 (ref 5–15)
BUN: 11 mg/dL (ref 6–20)
CO2: 28 mmol/L (ref 22–32)
Calcium: 9.1 mg/dL (ref 8.9–10.3)
Chloride: 107 mmol/L (ref 98–111)
Creatinine, Ser: 0.65 mg/dL (ref 0.44–1.00)
GFR, Estimated: >60 mL/min (ref >60)
Glucose, Bld: 93 mg/dL (ref 70–99)
Potassium: 4 mmol/L (ref 3.5–5.1)
Sodium: 142 mmol/L (ref 135–145)
Total Bilirubin: 0.3 mg/dL (ref 0.0–1.2)
Total Protein: 7 g/dL (ref 6.5–8.1)

## 2024-02-10 LAB — CBC WITH DIFFERENTIAL/PLATELET
Abs Immature Granulocytes: 0.02 K/uL (ref 0.00–0.07)
Basophils Absolute: 0 K/uL (ref 0.0–0.1)
Basophils Relative: 1 %
Eosinophils Absolute: 0.1 K/uL (ref 0.0–0.5)
Eosinophils Relative: 4 %
HCT: 37.8 % (ref 36.0–46.0)
Hemoglobin: 12.3 g/dL (ref 12.0–15.0)
Immature Granulocytes: 1 %
Lymphocytes Relative: 36 %
Lymphs Abs: 1.2 K/uL (ref 0.7–4.0)
MCH: 29.2 pg (ref 26.0–34.0)
MCHC: 32.5 g/dL (ref 30.0–36.0)
MCV: 89.8 fL (ref 80.0–100.0)
Monocytes Absolute: 0.4 K/uL (ref 0.1–1.0)
Monocytes Relative: 13 %
Neutro Abs: 1.5 K/uL — ABNORMAL LOW (ref 1.7–7.7)
Neutrophils Relative %: 45 %
Platelets: 158 K/uL (ref 150–400)
RBC: 4.21 MIL/uL (ref 3.87–5.11)
RDW: 13.8 % (ref 11.5–15.5)
WBC: 3.3 K/uL — ABNORMAL LOW (ref 4.0–10.5)
nRBC: 0 % (ref 0.0–0.2)

## 2024-02-10 LAB — LIPID PANEL
Cholesterol: 178 mg/dL (ref 0–200)
HDL: 67 mg/dL (ref >40)
LDL Cholesterol: 100 mg/dL — ABNORMAL HIGH (ref 0–99)
Total CHOL/HDL Ratio: 2.7 ratio
Triglycerides: 54 mg/dL (ref <150)
VLDL: 11 mg/dL (ref 0–40)

## 2024-02-10 LAB — TSH: TSH: 0.835 u[IU]/mL (ref 0.350–4.500)

## 2024-02-15 ENCOUNTER — Ambulatory Visit
Admission: RE | Admit: 2024-02-15 | Discharge: 2024-02-15 | Disposition: A | Source: Ambulatory Visit | Attending: Internal Medicine | Admitting: Internal Medicine

## 2024-02-15 ENCOUNTER — Other Ambulatory Visit (HOSPITAL_COMMUNITY): Payer: Self-pay

## 2024-02-15 ENCOUNTER — Other Ambulatory Visit: Payer: Self-pay

## 2024-02-15 ENCOUNTER — Ambulatory Visit: Payer: 59 | Admitting: Internal Medicine

## 2024-02-15 ENCOUNTER — Encounter: Payer: Self-pay | Admitting: Internal Medicine

## 2024-02-15 VITALS — BP 110/80 | HR 70 | Ht 68.5 in | Wt 191.0 lb

## 2024-02-15 DIAGNOSIS — E559 Vitamin D deficiency, unspecified: Secondary | ICD-10-CM | POA: Diagnosis not present

## 2024-02-15 DIAGNOSIS — D709 Neutropenia, unspecified: Secondary | ICD-10-CM | POA: Diagnosis not present

## 2024-02-15 DIAGNOSIS — Z8719 Personal history of other diseases of the digestive system: Secondary | ICD-10-CM

## 2024-02-15 DIAGNOSIS — M25552 Pain in left hip: Secondary | ICD-10-CM | POA: Diagnosis not present

## 2024-02-15 DIAGNOSIS — G43909 Migraine, unspecified, not intractable, without status migrainosus: Secondary | ICD-10-CM | POA: Diagnosis not present

## 2024-02-15 DIAGNOSIS — Z0000 Encounter for general adult medical examination without abnormal findings: Secondary | ICD-10-CM

## 2024-02-15 DIAGNOSIS — E785 Hyperlipidemia, unspecified: Secondary | ICD-10-CM

## 2024-02-15 DIAGNOSIS — E049 Nontoxic goiter, unspecified: Secondary | ICD-10-CM

## 2024-02-15 DIAGNOSIS — I10 Essential (primary) hypertension: Secondary | ICD-10-CM | POA: Diagnosis not present

## 2024-02-15 DIAGNOSIS — J309 Allergic rhinitis, unspecified: Secondary | ICD-10-CM

## 2024-02-15 DIAGNOSIS — E038 Other specified hypothyroidism: Secondary | ICD-10-CM | POA: Diagnosis not present

## 2024-02-15 DIAGNOSIS — Z8669 Personal history of other diseases of the nervous system and sense organs: Secondary | ICD-10-CM

## 2024-02-15 MED ORDER — RIZATRIPTAN BENZOATE 10 MG PO TABS
10.0000 mg | ORAL_TABLET | ORAL | 1 refills | Status: AC | PRN
  Filled 2024-02-15: qty 10, 12d supply, fill #0

## 2024-02-18 ENCOUNTER — Other Ambulatory Visit: Payer: Self-pay | Admitting: Internal Medicine

## 2024-02-18 DIAGNOSIS — H5213 Myopia, bilateral: Secondary | ICD-10-CM | POA: Diagnosis not present

## 2024-02-18 NOTE — Telephone Encounter (Signed)
 Copied from CRM #[id number]. Topic: Clinical - Medication Refill >> Feb 18, 2024  3:33 PM Jayma L wrote: Patient was told to take this everyday -- please advise   Medication: rosuvastatin  (CRESTOR ) 5 MG tablet  Has the patient contacted their pharmacy? Yes (Agent: If no, request that the patient contact the pharmacy for the refill. If patient does not wish to contact the pharmacy document the reason why and proceed with request.) (Agent: If yes, when and what did the pharmacy advise?)  This is the patient's preferred pharmacy:  [facility] [address] [place] [address] Phone: [phone] Fax: [phone]   Is this the correct pharmacy for this prescription? Yes If no, delete pharmacy and type the correct one.   Has the prescription been filled recently? No  Is the patient out of the medication? no  Has the patient been seen for an appointment in the last year OR does the patient have an upcoming appointment? Yes  Can we respond through MyChart? Yes  Agent: Please be advised that Rx refills may take up to 3 business days. We ask that you follow-up with your pharmacy.

## 2024-02-21 ENCOUNTER — Ambulatory Visit: Payer: Self-pay | Admitting: Internal Medicine

## 2024-02-21 ENCOUNTER — Other Ambulatory Visit (HOSPITAL_COMMUNITY): Payer: Self-pay

## 2024-02-21 MED ORDER — ROSUVASTATIN CALCIUM 5 MG PO TABS
5.0000 mg | ORAL_TABLET | ORAL | 3 refills | Status: DC
  Filled 2024-02-21 – 2024-03-04 (×2): qty 90, 210d supply, fill #0
  Filled 2024-03-11: qty 36, 84d supply, fill #0

## 2024-02-22 ENCOUNTER — Other Ambulatory Visit (HOSPITAL_COMMUNITY): Payer: Self-pay

## 2024-03-05 ENCOUNTER — Other Ambulatory Visit (HOSPITAL_COMMUNITY): Payer: Self-pay

## 2024-03-05 ENCOUNTER — Encounter: Payer: Self-pay | Admitting: Internal Medicine

## 2024-03-05 ENCOUNTER — Encounter (HOSPITAL_COMMUNITY): Payer: Self-pay | Admitting: Pharmacist

## 2024-03-05 NOTE — Patient Instructions (Addendum)
 It was a pleasure to see you today. Continue current medications and return in one year or as needed.Patient would like sleep evaluation as husband says she snores.Please have Xray of left hip to see if you have osteoarthritis of left hip.

## 2024-03-06 ENCOUNTER — Telehealth: Payer: Self-pay

## 2024-03-06 NOTE — Telephone Encounter (Signed)
 I have emailed Andres Bloch to assist with this request.   Copied from CRM [id number]. Topic: General - Billing Inquiry >> Feb 29, 2024  4:49 PM Tobias CROME wrote: Reason for CRM:   Patient has received bill for labwork of $300 DOS 02/10/2024. Patient states the reason for the cost is due to diagnosis of hypertension, however the labwork was for her physical. Patient inquiring if the labwork could be re-coded for physical and not hypertension.    Requesting callback: [phone]

## 2024-03-11 ENCOUNTER — Other Ambulatory Visit (HOSPITAL_COMMUNITY): Payer: Self-pay

## 2024-03-13 ENCOUNTER — Other Ambulatory Visit: Payer: Self-pay

## 2024-03-14 ENCOUNTER — Other Ambulatory Visit (HOSPITAL_COMMUNITY): Payer: Self-pay

## 2024-03-14 ENCOUNTER — Telehealth: Payer: Self-pay | Admitting: Internal Medicine

## 2024-03-14 ENCOUNTER — Encounter: Payer: Self-pay | Admitting: Internal Medicine

## 2024-03-14 DIAGNOSIS — E7800 Pure hypercholesterolemia, unspecified: Secondary | ICD-10-CM

## 2024-03-14 MED ORDER — ROSUVASTATIN CALCIUM 5 MG PO TABS
5.0000 mg | ORAL_TABLET | Freq: Every day | ORAL | 3 refills | Status: AC
  Filled 2024-03-14 – 2024-04-12 (×3): qty 90, 90d supply, fill #0

## 2024-03-14 NOTE — Telephone Encounter (Signed)
 Phone call to patient. She called yesterday about her lab bill. She requested labs be drawn at [facility] as husband was there at the time and it was more convenient to labs drawn there. She was billed for the labs but has contacted the insurance company and now has the matter resolved.  Patient wants Rx for rosuvastatin  to state daily instead of 3 times weekly. Will make change. She has pure hypercholesterolemia.

## 2024-03-28 ENCOUNTER — Other Ambulatory Visit (HOSPITAL_COMMUNITY): Payer: Self-pay

## 2024-03-30 ENCOUNTER — Ambulatory Visit: Admitting: Internal Medicine

## 2024-03-30 ENCOUNTER — Encounter: Payer: Self-pay | Admitting: Internal Medicine

## 2024-03-30 VITALS — BP 130/80 | HR 84 | Temp 98.4°F | Ht 68.5 in | Wt 190.8 lb

## 2024-03-30 DIAGNOSIS — R0683 Snoring: Secondary | ICD-10-CM | POA: Diagnosis not present

## 2024-03-30 DIAGNOSIS — G4733 Obstructive sleep apnea (adult) (pediatric): Secondary | ICD-10-CM

## 2024-03-30 NOTE — Progress Notes (Signed)
 "  Name: Erin [name] MRN: [id number] DOB: 1965/11/25    CHIEF COMPLAINT:  ASSESSMENT OF SLEEP APNEA   HISTORY OF PRESENT ILLNESS: Patient is seen today for problems and issues with sleep related to excessive daytime sleepiness Patient  has been having sleep problems for many years Patient has been having excessive daytime sleepiness for a long time Patient has been having extreme fatigue and tiredness, lack of energy +  very Loud snoring every night + morning headaches  Discussed sleep data and reviewed with patient.      03/30/2024    3:00 PM  Results of the Epworth flowsheet  Sitting and reading 2  Watching TV 2  Sitting, inactive in a public place (e.g. a theatre or a meeting) 1  As a passenger in a car for an hour without a break 0  Lying down to rest in the afternoon when circumstances permit 0  Sitting and talking to someone 0  Sitting quietly after a lunch without alcohol 0  In a car, while stopped for a few minutes in traffic 0  Total score 5    Non-smoker Nonalcoholic Registered nurse to cardiac rehab for 30 years No significant cardiac issues No significant respiratory issues  PAST MEDICAL HISTORY :   has a past medical history of Allergy, Anemia, Asthma, Complication of anesthesia, Fibrocystic breast disease, Finger mass, right, Hemorrhoids, Hypertension, Hypothyroidism, PONV (postoperative nausea and vomiting), and Thyroid  disease.  has a past surgical history that includes Tubal ligation; Breast surgery; Wisdom tooth extraction; Breast cyst excision (Left); Breast excisional biopsy (Bilateral); and Mass excision (Right, 04/25/2019). Prior to Admission medications  Medication Sig Start Date End Date Taking? Authorizing Provider  acetaminophen  (TYLENOL ) 500 MG tablet Take 500 mg by mouth every 6 (six) hours as needed for mild pain (pain score 1-3), fever or headache. 01/08/23  Yes [provider]  albuterol  (VENTOLIN  HFA) 108 (90 Base) MCG/ACT inhaler  Inhale 2 puffs into the lungs every 6 (six) hours as needed for wheezing or shortness of breath. 10/15/22   Perri Ronal PARAS, MD  amLODipine  (NORVASC ) 2.5 MG tablet Take 1 tablet (2.5 mg total) by mouth daily. 01/05/24 01/04/25  Perri Ronal PARAS, MD  bisacodyl (DULCOLAX) 5 MG EC tablet Take 5 mg by mouth daily as needed for moderate constipation.    [provider]  butalbital -acetaminophen -caffeine  (FIORICET ) 50-325-40 MG tablet Take 1-2 tablets by mouth every 4 (four) to 6 (six) hours as needed for headache. No more than 6 tablets per 24 hours. 10/11/23   Perri Ronal PARAS, MD  diphenhydrAMINE  (BENADRYL ) 25 MG tablet Take 25 mg by mouth at bedtime.    [provider]  EPINEPHrine  0.3 mg/0.3 mL IJ SOAJ injection INJECT 0.3 MLS (1 PEN) INTO THE MUSCLE AS NEEDED FOR ANAPHYLAXIS. 01/24/24   Perri Ronal PARAS, MD  estradiol  (ESTRACE ) 0.01 % CREA vaginal cream Insert 1 gram vaginally every other day for 14 days, then twice weekly 01/27/24     fluticasone  (FLONASE ) 50 MCG/ACT nasal spray Place 2 sprays into both nostrils daily. 04/12/23   Perri Ronal PARAS, MD  Fluticasone  Propionate, Inhal, 50 MCG/ACT AEPB Inhale 2 puffs every day by inhalation route.    [provider]  hydrocortisone  (ANUSOL -HC) 25 MG suppository Unwrap and insert 1 suppository rectally twice daily as needed for hemorrhoid flare 03/11/23   Perri Ronal PARAS, MD  hydrocortisone  (PROCTOZONE -HC) 2.5 % rectal cream Apply rectally 2 times daily as directed. 03/08/23   Perri Ronal PARAS, MD  levofloxacin  (LEVAQUIN ) 500 MG tablet Take 1 tablet by mouth daily.    [provider]  levothyroxine  (SYNTHROID ) 25 MCG tablet Take 1 tablet (25 mcg total) by mouth every morning on an empty stomach. 07/04/23     Melatonin 5 MG TABS Take 6 mg daily by mouth.    [provider]  Multiple Vitamin (MULTI-VITAMIN) tablet Take 1 tablet by mouth daily.    [provider]  mupirocin  ointment (BACTROBAN ) 2 % Apply 1 Application  topically 2 (two) times daily. 09/20/23   Perri Ronal PARAS, MD  ondansetron  (ZOFRAN ) 4 MG tablet Take 1 tablet (4 mg total) by mouth every 8 (eight) hours as needed for nausea or vomiting. 09/13/20   Perri Ronal PARAS, MD  Probiotic Product (PROBIOTIC-10) CHEW Chew by mouth.    [provider]  rizatriptan  (MAXALT ) 10 MG tablet Take 1 tablet (10 mg total) by mouth as needed for migraine. May repeat in 2 hours if needed 02/15/24   Perri Ronal PARAS, MD  rosuvastatin  (CRESTOR ) 5 MG tablet Take 1 tablet (5 mg total) by mouth daily. 03/14/24   Perri Ronal PARAS, MD  Vitamin D , Ergocalciferol , (DRISDOL ) 1.25 MG (50000 UNIT) CAPS capsule Take 1 capsule (50,000 Units total) by mouth once a week. 12/24/23 12/23/24  Perri Ronal PARAS, MD   Allergies[1]  FAMILY HISTORY:  family history includes Cancer in her father; Diabetes in her father and mother; Hyperlipidemia in an other family member; Hypertension in her father and mother; Prostate cancer in her maternal uncle. SOCIAL HISTORY:  reports that she has never smoked. She has never used smokeless tobacco. She reports current alcohol use. She reports that she does not use drugs.   BP 130/80   Pulse 84   Temp 98.4 F (36.9 C)   Ht 5' 8.5 (1.74 m)   Wt 190 lb 12.8 oz (86.5 kg)   LMP 10/08/2015   SpO2 98%   BMI 28.59 kg/m      Review of Systems: Gen:  Denies  fever, sweats, chills weight loss  HEENT: Denies blurred vision, double vision, ear pain, eye pain, hearing loss, nose bleeds, sore throat Cardiac:  No dizziness, chest pain or heaviness, chest tightness,edema, No JVD Resp:   No cough, -sputum production, -shortness of breath,-wheezing, -hemoptysis,  Other:  All other systems negative   Physical Examination:   General Appearance: No distress  EYES PERRLA, EOM intact.   NECK Supple, No JVD Pulmonary: normal breath sounds, No wheezing.  CardiovascularNormal S1,S2.  No m/r/g.   Abdomen: Benign, Soft, non-tender. Neurology UE/LE 5/5 strength,  no focal deficits Ext pulses intact, cap refill intact ALL OTHER ROS ARE NEGATIVE     ASSESSMENT AND PLAN SYNOPSIS  Patient with signs and symptoms of excessive snoring with probable underlying diagnosis of obstructive sleep apnea    Recommend home sleep Study for definitve diagnosis   MEDICATION ADJUSTMENTS/LABS AND TESTS ORDERED: Recommend Sleep Study  CURRENT MEDICATIONS REVIEWED AT LENGTH WITH PATIENT TODAY   Patient  satisfied with Plan of action and management. All questions answered   Follow up 3 months   I spent a total of 45 minutes dedicated to the care of this patient on the date of this encounter to include pre-visit review of records, face-to-face time with the patient discussing conditions above, post visit ordering of testing, clinical documentation with the electronic health record, making appropriate referrals as documented, and communicating necessary information to the patient's healthcare team.     Markees Carns David Ainslie Mazurek,  M.D.  [facility]  Medical Director [facility] [place]         [1]  Allergies Allergen Reactions   Aspirin     REACTION: Hives, Wheezing   Nsaids     REACTION: Hives, Wheezing   Other     Other reaction(s): Other (See Comments) REACTION: Hives, Wheezing hives   Shellfish Allergy Other (See Comments)    hives   "

## 2024-03-30 NOTE — Patient Instructions (Signed)
 SABRA

## 2024-04-12 ENCOUNTER — Other Ambulatory Visit: Payer: Self-pay | Admitting: Internal Medicine

## 2024-04-12 ENCOUNTER — Other Ambulatory Visit (HOSPITAL_COMMUNITY): Payer: Self-pay

## 2024-04-13 ENCOUNTER — Other Ambulatory Visit (HOSPITAL_COMMUNITY): Payer: Self-pay

## 2024-04-13 ENCOUNTER — Other Ambulatory Visit: Payer: Self-pay

## 2024-04-13 MED ORDER — FLUTICASONE PROPIONATE 50 MCG/ACT NA SUSP
2.0000 | Freq: Every day | NASAL | 11 refills | Status: AC
  Filled 2024-04-13: qty 48, 90d supply, fill #0

## 2024-05-15 ENCOUNTER — Encounter: Admitting: Dietician

## 2024-06-29 ENCOUNTER — Ambulatory Visit: Admitting: Internal Medicine
